# Patient Record
Sex: Male | Born: 1935 | Race: White | Hispanic: No | Smoking: Former smoker
Health system: Southern US, Community
[De-identification: ages and names within clinical notes are randomized; demographics above are authoritative.]

## PROBLEM LIST (undated history)

## (undated) DIAGNOSIS — H269 Unspecified cataract: Secondary | ICD-10-CM

## (undated) DIAGNOSIS — I10 Essential (primary) hypertension: Secondary | ICD-10-CM

## (undated) DIAGNOSIS — R0902 Hypoxemia: Secondary | ICD-10-CM

## (undated) DIAGNOSIS — K5900 Constipation, unspecified: Secondary | ICD-10-CM

## (undated) DIAGNOSIS — R011 Cardiac murmur, unspecified: Secondary | ICD-10-CM

## (undated) DIAGNOSIS — F329 Major depressive disorder, single episode, unspecified: Secondary | ICD-10-CM

## (undated) DIAGNOSIS — I350 Nonrheumatic aortic (valve) stenosis: Secondary | ICD-10-CM

## (undated) DIAGNOSIS — S72033A Displaced midcervical fracture of unspecified femur, initial encounter for closed fracture: Secondary | ICD-10-CM

## (undated) DIAGNOSIS — F32A Depression, unspecified: Secondary | ICD-10-CM

## (undated) DIAGNOSIS — K469 Unspecified abdominal hernia without obstruction or gangrene: Secondary | ICD-10-CM

## (undated) DIAGNOSIS — K921 Melena: Secondary | ICD-10-CM

## (undated) DIAGNOSIS — N200 Calculus of kidney: Secondary | ICD-10-CM

## (undated) DIAGNOSIS — I739 Peripheral vascular disease, unspecified: Secondary | ICD-10-CM

## (undated) DIAGNOSIS — G609 Hereditary and idiopathic neuropathy, unspecified: Secondary | ICD-10-CM

## (undated) DIAGNOSIS — D649 Anemia, unspecified: Secondary | ICD-10-CM

## (undated) DIAGNOSIS — R609 Edema, unspecified: Secondary | ICD-10-CM

## (undated) DIAGNOSIS — M79609 Pain in unspecified limb: Secondary | ICD-10-CM

## (undated) DIAGNOSIS — K210 Gastro-esophageal reflux disease with esophagitis: Secondary | ICD-10-CM

## (undated) DIAGNOSIS — E785 Hyperlipidemia, unspecified: Secondary | ICD-10-CM

## (undated) DIAGNOSIS — R269 Unspecified abnormalities of gait and mobility: Secondary | ICD-10-CM

## (undated) DIAGNOSIS — C4441 Basal cell carcinoma of skin of scalp and neck: Secondary | ICD-10-CM

## (undated) DIAGNOSIS — N401 Enlarged prostate with lower urinary tract symptoms: Principal | ICD-10-CM

## (undated) DIAGNOSIS — D494 Neoplasm of unspecified behavior of bladder: Secondary | ICD-10-CM

## (undated) DIAGNOSIS — C61 Malignant neoplasm of prostate: Secondary | ICD-10-CM

## (undated) DIAGNOSIS — I060 Rheumatic aortic stenosis: Secondary | ICD-10-CM

## (undated) DIAGNOSIS — G4700 Insomnia, unspecified: Secondary | ICD-10-CM

## (undated) DIAGNOSIS — H02109 Unspecified ectropion of unspecified eye, unspecified eyelid: Secondary | ICD-10-CM

## (undated) HISTORY — DX: Peripheral vascular disease, unspecified: I73.9

## (undated) HISTORY — DX: Hypoxemia: R09.02

## (undated) HISTORY — DX: Constipation, unspecified: K59.00

## (undated) HISTORY — DX: Unspecified cataract: H26.9

## (undated) HISTORY — DX: Depression, unspecified: F32.A

## (undated) HISTORY — DX: Basal cell carcinoma of skin of scalp and neck: C44.41

## (undated) HISTORY — DX: Displaced midcervical fracture of unspecified femur, initial encounter for closed fracture: S72.033A

## (undated) HISTORY — DX: Nonrheumatic aortic (valve) stenosis: I35.0

## (undated) HISTORY — DX: Unspecified abnormalities of gait and mobility: R26.9

## (undated) HISTORY — DX: Major depressive disorder, single episode, unspecified: F32.9

## (undated) HISTORY — DX: Neoplasm of unspecified behavior of bladder: D49.4

## (undated) HISTORY — DX: Insomnia, unspecified: G47.00

## (undated) HISTORY — DX: Edema, unspecified: R60.9

## (undated) HISTORY — DX: Unspecified ectropion of unspecified eye, unspecified eyelid: H02.109

## (undated) HISTORY — DX: Gastro-esophageal reflux disease with esophagitis: K21.0

## (undated) HISTORY — DX: Melena: K92.1

## (undated) HISTORY — PX: CHOLECYSTECTOMY: SHX55

## (undated) HISTORY — DX: Cardiac murmur, unspecified: R01.1

## (undated) HISTORY — DX: Anemia, unspecified: D64.9

## (undated) HISTORY — PX: EYE SURGERY: SHX253

## (undated) HISTORY — DX: Pain in unspecified limb: M79.609

## (undated) HISTORY — DX: Hereditary and idiopathic neuropathy, unspecified: G60.9

## (undated) HISTORY — DX: Rheumatic aortic stenosis: I06.0

## (undated) HISTORY — DX: Hyperlipidemia, unspecified: E78.5

## (undated) HISTORY — DX: Benign prostatic hyperplasia with lower urinary tract symptoms: N40.1

---

## 2000-01-18 ENCOUNTER — Ambulatory Visit (HOSPITAL_COMMUNITY): Admission: RE | Admit: 2000-01-18 | Discharge: 2000-01-18 | Payer: Self-pay | Admitting: Gastroenterology

## 2001-01-31 ENCOUNTER — Other Ambulatory Visit: Admission: RE | Admit: 2001-01-31 | Discharge: 2001-01-31 | Payer: Self-pay | Admitting: Urology

## 2001-01-31 ENCOUNTER — Encounter (INDEPENDENT_AMBULATORY_CARE_PROVIDER_SITE_OTHER): Payer: Self-pay | Admitting: Specialist

## 2001-02-12 ENCOUNTER — Encounter: Payer: Self-pay | Admitting: Urology

## 2001-02-12 ENCOUNTER — Encounter: Admission: RE | Admit: 2001-02-12 | Discharge: 2001-02-12 | Payer: Self-pay | Admitting: Urology

## 2001-02-20 ENCOUNTER — Ambulatory Visit: Admission: RE | Admit: 2001-02-20 | Discharge: 2001-05-21 | Payer: Self-pay | Admitting: Radiation Oncology

## 2001-04-04 ENCOUNTER — Encounter: Payer: Self-pay | Admitting: Urology

## 2001-05-02 ENCOUNTER — Ambulatory Visit (HOSPITAL_COMMUNITY): Admission: RE | Admit: 2001-05-02 | Discharge: 2001-05-02 | Payer: Self-pay | Admitting: Urology

## 2001-05-02 ENCOUNTER — Encounter: Payer: Self-pay | Admitting: Urology

## 2001-05-23 ENCOUNTER — Ambulatory Visit: Admission: RE | Admit: 2001-05-23 | Discharge: 2001-08-21 | Payer: Self-pay | Admitting: Radiation Oncology

## 2002-01-28 ENCOUNTER — Ambulatory Visit (HOSPITAL_COMMUNITY): Admission: RE | Admit: 2002-01-28 | Discharge: 2002-01-28 | Payer: Self-pay | Admitting: Gastroenterology

## 2004-01-29 ENCOUNTER — Ambulatory Visit (HOSPITAL_COMMUNITY): Admission: AD | Admit: 2004-01-29 | Discharge: 2004-01-31 | Payer: Self-pay | Admitting: Ophthalmology

## 2004-02-26 ENCOUNTER — Emergency Department (HOSPITAL_COMMUNITY): Admission: EM | Admit: 2004-02-26 | Discharge: 2004-02-26 | Payer: Self-pay | Admitting: Emergency Medicine

## 2004-02-28 ENCOUNTER — Emergency Department (HOSPITAL_COMMUNITY): Admission: EM | Admit: 2004-02-28 | Discharge: 2004-02-29 | Payer: Self-pay | Admitting: Emergency Medicine

## 2004-03-07 ENCOUNTER — Encounter: Admission: RE | Admit: 2004-03-07 | Discharge: 2004-03-07 | Payer: Self-pay | Admitting: Gastroenterology

## 2004-03-08 ENCOUNTER — Ambulatory Visit (HOSPITAL_COMMUNITY): Admission: RE | Admit: 2004-03-08 | Discharge: 2004-03-08 | Payer: Self-pay | Admitting: Gastroenterology

## 2004-03-15 ENCOUNTER — Encounter (INDEPENDENT_AMBULATORY_CARE_PROVIDER_SITE_OTHER): Payer: Self-pay | Admitting: *Deleted

## 2004-03-16 ENCOUNTER — Inpatient Hospital Stay (HOSPITAL_COMMUNITY): Admission: RE | Admit: 2004-03-16 | Discharge: 2004-03-18 | Payer: Self-pay | Admitting: General Surgery

## 2006-07-09 HISTORY — PX: HERNIA REPAIR: SHX51

## 2007-07-23 ENCOUNTER — Inpatient Hospital Stay (HOSPITAL_COMMUNITY): Admission: RE | Admit: 2007-07-23 | Discharge: 2007-07-24 | Payer: Self-pay | Admitting: Neurosurgery

## 2008-07-09 HISTORY — PX: SPINE SURGERY: SHX786

## 2008-09-13 ENCOUNTER — Encounter: Admission: RE | Admit: 2008-09-13 | Discharge: 2008-09-13 | Payer: Self-pay | Admitting: Sports Medicine

## 2008-09-27 ENCOUNTER — Encounter: Admission: RE | Admit: 2008-09-27 | Discharge: 2008-09-27 | Payer: Self-pay | Admitting: Sports Medicine

## 2008-10-11 ENCOUNTER — Encounter: Admission: RE | Admit: 2008-10-11 | Discharge: 2008-10-11 | Payer: Self-pay | Admitting: Sports Medicine

## 2008-11-15 ENCOUNTER — Inpatient Hospital Stay (HOSPITAL_COMMUNITY): Admission: RE | Admit: 2008-11-15 | Discharge: 2008-11-16 | Payer: Self-pay | Admitting: Neurosurgery

## 2008-12-09 ENCOUNTER — Encounter: Admission: RE | Admit: 2008-12-09 | Discharge: 2008-12-09 | Payer: Self-pay | Admitting: Neurosurgery

## 2009-12-09 ENCOUNTER — Ambulatory Visit: Payer: Self-pay | Admitting: Vascular Surgery

## 2010-02-10 ENCOUNTER — Ambulatory Visit: Payer: Self-pay | Admitting: Vascular Surgery

## 2010-07-09 DIAGNOSIS — H269 Unspecified cataract: Secondary | ICD-10-CM

## 2010-07-09 HISTORY — DX: Unspecified cataract: H26.9

## 2010-09-02 ENCOUNTER — Emergency Department (HOSPITAL_COMMUNITY)
Admission: EM | Admit: 2010-09-02 | Discharge: 2010-09-02 | Disposition: A | Discharge status: Home / Self Care | Payer: Medicare Other | Attending: Emergency Medicine | Admitting: Emergency Medicine

## 2010-09-02 DIAGNOSIS — T8140XA Infection following a procedure, unspecified, initial encounter: Secondary | ICD-10-CM | POA: Insufficient documentation

## 2010-09-02 DIAGNOSIS — I10 Essential (primary) hypertension: Secondary | ICD-10-CM | POA: Insufficient documentation

## 2010-09-02 DIAGNOSIS — Y838 Other surgical procedures as the cause of abnormal reaction of the patient, or of later complication, without mention of misadventure at the time of the procedure: Secondary | ICD-10-CM | POA: Insufficient documentation

## 2010-10-17 LAB — CBC
HCT: 39.5 % (ref 39.0–52.0)
Hemoglobin: 13.6 g/dL (ref 13.0–17.0)
MCHC: 34.5 g/dL (ref 30.0–36.0)
MCV: 98.6 fL (ref 78.0–100.0)
Platelets: 296 10*3/uL (ref 150–400)
RBC: 4 MIL/uL — ABNORMAL LOW (ref 4.22–5.81)
RDW: 14.5 % (ref 11.5–15.5)
WBC: 7.9 10*3/uL (ref 4.0–10.5)

## 2010-10-17 LAB — BASIC METABOLIC PANEL
BUN: 16 mg/dL (ref 6–23)
CO2: 29 mEq/L (ref 19–32)
Calcium: 9.7 mg/dL (ref 8.4–10.5)
Chloride: 104 mEq/L (ref 96–112)
Creatinine, Ser: 1.01 mg/dL (ref 0.4–1.5)
GFR calc Af Amer: >60 mL/min (ref >60)
GFR calc non Af Amer: >60 mL/min (ref >60)
Glucose, Bld: 100 mg/dL — ABNORMAL HIGH (ref 70–99)
Potassium: 4.2 mEq/L (ref 3.5–5.1)
Sodium: 141 mEq/L (ref 135–145)

## 2010-11-21 NOTE — Op Note (Signed)
NAMESAMUAL, BEALS              ACCOUNT NO.:  [id number]   MEDICAL RECORD NO.:  [id number]          PATIENT TYPE:  INP   LOCATION:  3535                         FACILITY:  [facility]   PHYSICIAN:  Hewitt Shorts, M.D.DATE OF BIRTH:  Jul 15, 1925   DATE OF PROCEDURE:  11/15/2008  DATE OF DISCHARGE:                               OPERATIVE REPORT   PREOPERATIVE DIAGNOSES:  Right L4-5 lumbar disk herniation, lumbar  degenerative disk disease, lumbar spondylosis, and lumbar radiculopathy.   POSTOPERATIVE DIAGNOSES:  Right L4-5 lumbar disk herniation, lumbar  degenerative disk disease, lumbar spondylosis, lumbar radiculopathy, and  right L4-5 synovial cyst.   PROCEDURES:  Right L4-5 lumbar laminotomy, resection of synovial cyst  with microdissection and microdiskectomy with microdissection.   SURGEON:  Hewitt Shorts, M.D.   ASSISTANT:  1. Nelia Shi. Webb Silversmith, RN  2. Payton Doughty, M.D.   ANESTHESIA:  General endotracheal.   INDICATIONS:  The patient is an 77 year old man who presented with a  right lumbar radiculopathy.  He was found to have a large right L4-5  lumbar disk herniation, assume underlying spondylosis with degenerative  disk disease.  Decision was made to proceed with elective laminotomy and  microdiskectomy.   PROCEDURES IN DETAIL:  The patient was brought to the operating room and  placed under general endotracheal anesthesia.  The patient was turned to  a prone position.  Lumbar region was prepped with Betadine soap and  solution and draped in a sterile fashion.  The midline was infiltrated  with local anesthetic with epinephrine.  An x-ray was taken, and the L4-  5 level identified.  A midline incision was made at the L4-5 level and  carried down through the subcutaneous tissue.  Bipolar cautery and  electrocautery were used to maintain hemostasis.  Dissection was carried  down through the lumbar fascia, which was incised on the right side of  the midline, and  the paraspinal muscles were dissected from the spinous  process and lamina in a subperiosteal fashion.  The L4-5 intralaminar  space was identified.  An x-ray was taken to confirm the localization.  Then, the microscope was draped and brought to the field to provide  additional navigation, illumination, and visualization, and the  remainder of the decompression was performed using microdissection and  microsurgical technique.   Laminotomy was performed using the X-Max drill and Kerrison punches.  As  this was performed, we began to find a large synovial cyst in the  dorsolateral epidural space.  It was partially calcified. We extended  the laminotomy rostrally to have better exposure around this.  It was  stuck down to the lateral aspect of the thecal sac in the lateral aspect  of the right L5 nerve root.  We mobilized it as feasible and resected  the synovial cyst taking care to leave the dura undisturbed, and the  small bits of the cyst, which were partially calcified and densely  adherent to the dura, were left attached to the dura, but the mass of  the synovial cyst was successfully removed.  We then continued our  exploration of  the epidural space ventrally.  The disk space was  identified.  There was spondylitic overgrowth from the posterior aspect  with disk space, and we encountered several fragments of very  degenerated disk material that had hernia through a large rent in the  anulus.  We continued to open the anulus.  We removed spondylitic  overgrowth in the posterior aspects of the L4 and L5 and vertebral  bodies.  We entered into the disk space and proceeded with a thorough  diskectomy using a variety of microcurettes and pituitary rongeurs, and  we thoroughly examined the epidural space to remove the additional  fragments and achieved good decompression of the thecal sac and nerve  roots.  Hemostasis was established using bipolar cautery and Gelfoam  soaked in thrombin.   In the end, the mass of the synovial cyst was  successfully resected.  The herniated disk including the fragments free  within the epidural space as well as the degenerative disk material  within the disk space was successfully removed.  Small bits of the edges  of the synovial cyst which were densely adherent to the dura were left  in place,so as not to disrupt the dura, and it was felt that good  decompression was achieved, and we therefore went ahead and irrigated  the wound thoroughly with bacitracin solution.  Good hemostasis was  confirmed.  Good decompression was confirmed, and then we proceeded with  closure immediately.  Prior to closure, we instilled 2 mL of fentanyl  and 80 mg of Depo-Medrol into the epidural space, and then the deep  fascia was closed with interrupted undyed #1 Vicryl sutures.  Scarpa  fascia was closed with interrupted undyed 1 Vicryl sutures.  The  subcutaneous and subcuticular were closed with interrupted inverted 2-0  undyed Vicryl sutures, and the skin was closed with Dermabond.  The  procedure was tolerated well.  The estimated blood loss was 50 mL.  Sponge and needle count were correct.  Following surgery, the patient  was returned back to the supine position to reverse from the anesthetic,  extubated, and transferred to the recovery room for further care.      Hewitt Shorts, M.D.  Electronically Signed     RWN/MEDQ  D:  11/15/2008  T:  11/16/2008  Job:  [id number]

## 2010-11-21 NOTE — Consult Note (Signed)
NEW PATIENT CONSULTATION   Justin Deleon, Justin Deleon  DOB:  1925-09-02                                       12/09/2009  WUJWJ#:[id number]   The patient presents today for evaluation of lower extremity discomfort.  He is a very pleasant, active 77 year old gentleman with bilateral lower  extremity discomfort.  He has several different components of this.  He  reports some of this to being flat-footed since his mid teens.  He  reports that if he does a great deal walking that he does have pretibial  aching.  It extends down into his feet.  This is relieved with rest.  He  has no tissue loss.  He does not have any resting symptoms.  He does not  have any extension of this discomfort higher up onto his thighs, and  this does occur with moderate walking.   PAST MEDICAL HISTORY:  Significant for hypertension.  He does not have  diabetes or cardiac disease.  He does have prior history for  cholecystectomy and surgery for detached retinas and also has had lumbar  disk surgery x2.  He also has history of surgery for prostate cancer.   FAMILY HISTORY:  Negative for premature atherosclerotic disease.   SOCIAL HISTORY:  He is married with 3 children.  He is a retired  Insurance underwriter.  He does not smoke.  He did quit smoking cigars in  1996.  He has 1 alcohol drink per day.   REVIEW OF SYSTEMS:  No weight loss or weight gain.  His weight is 182  pounds.  He is 5 feet 2 inches tall.  CARDIAC:  Positive for heart murmur.  PULMONARY:  Negative.  GI:  Positive for reflux.  GU:  Negative aside from prostate cancer.  VASCULAR:  Pain in legs with walking.  NEUROLOGIC:  Negative for blackouts, dizziness, seizure.  MUSCULOSKELETAL:  Negative from above.  PSYCHIATRIC:  Positive for depression.  HEENT:  Positive for change in his eyesight and hearing.  HEMATOLOGIC:  No positive review of systems.   PHYSICAL EXAMINATION:  Well-developed, well-nourished white male,  appearing stated  age of 59.  Blood pressure 134/72, pulse 69,  respirations 18.  His oxygen saturation is 98%.  He is in no acute  distress.  HEENT:  Normal.  Neck:  No bruits bilaterally.  Chest:  Clear  bilaterally.  Heart:  Regular rate and rhythm with murmur present.  Abdomen:  Soft, nontender.  No masses noted.  Musculoskeletal:  Shows no  major deformities or cyanosis.  Neurologic:  No focal weakness,  paresthesias.  Skin:  Without ulcers or rashes.  He does have palpable  femoral and palpable popliteal pulses and palpable radial pulses  bilaterally.  I do not palpate pedal pulses bilaterally.   He underwent noninvasive vascular laboratory studies in our office, and  I reviewed these with him.  This does show falsely elevated pressures  due to calcification.  His waveforms are biphasic, showing some disease.  Due to his palpable popliteal pulses, I feel that this is related to  tibial disease.  I discussed this at length with the patient.  I feel  that he has no limb-threatening level of ischemia.  I do feel that at  least part of this component of his pain onto his pretibial area is  related to typical lower extremity  claudication from tibial disease.  I  explained that this is not limb-threatening at this level.  I did  explain the potential benefit of Pletal for relief of his intermittent  claudication.  I would recommend that we try this for 2 months to  determine if he is having any symptomatic relief and if so would  continue this, otherwise would discontinue this.  He was written a  prescription for Pletal 100 mg b.i.Deleon., and we will see him back in 2  months for further discussion.     Larina Earthly, M.Deleon.  Electronically Signed   TFE/MEDQ  Deleon:  12/09/2009  T:  12/12/2009  Job:  [id number]   cc:   Toni Arthurs, MD  Deirdre Peer. Polite, M.Deleon.

## 2010-11-21 NOTE — Assessment & Plan Note (Signed)
OFFICE VISIT   KEMONTE, ULLMAN  DOB:  Mar 13, 1926                                       02/10/2010  NWGNF#:[id number]   The patient presents today for continued discussion regarding his  moderate bilateral extremity arterial insufficiency.  I had seen him for  initial consultation on 12/09/2009.  At that time he was found to have  tibial disease with some discomfort in both lower extremities which  seemed to be of multiple etiologies.  I felt that he was at no limb  threatening risk for ischemia and had started him on Pletal to determine  if a trial of this would improve his walking.  He had GI upset related  to this and was unable to tolerate it.  He is stable, however, with no  tissue loss.  He does have palpable popliteal pulses and absent distal  pulses.  His feet are well-perfused with no lesions.  He understands to  continue watching for good foot care and will see [place] again on an as-  needed basis.     Larina Earthly, M.D.  Electronically Signed   TFE/MEDQ  D:  02/10/2010  T:  02/10/2010  Job:  [id number]   cc:   Dr Nehemiah Settle  Toni Arthurs, MD

## 2010-11-21 NOTE — Op Note (Signed)
Justin Deleon, Justin Deleon NO.:  [id number]   MEDICAL RECORD NO.:  [id number]          PATIENT TYPE:  INP   LOCATION:  3536                         FACILITY:  [facility]   PHYSICIAN:  Hewitt Shorts, M.D.DATE OF BIRTH:  05/22/1926   DATE OF PROCEDURE:  07/23/2007  DATE OF DISCHARGE:                               OPERATIVE REPORT   PREOPERATIVE DIAGNOSES:  1. Right L3-4 lumbar disk herniation.  2. Lumbar spondylosis.  3. Lumbar degenerative disk disease.  4. Lumbar radiculopathy.   POSTOPERATIVE DIAGNOSES:  1. Right L3-4 lumbar disk herniation.  2. Lumbar spondylosis.  3. Lumbar degenerative disk disease.  4. Lumbar radiculopathy.   PROCEDURE:  Right L3-4 lumbar laminotomy and microdiskectomy with  microdissection.   SURGEON:  Hewitt Shorts, M.D.   ASSISTANT:  Nelia Shi. Webb Silversmith, NP and Danae Orleans. Venetia Maxon, M.D.   ANESTHESIA:  General endotracheal.   INDICATIONS:  The patient is a 77 year old man who presented with a  right lumbar radiculopathy.  MRI revealed a large right L3-4 lumbar disk  herniation.  A decision was made to proceed with elective laminotomy and  microdiskectomy.   DESCRIPTION OF PROCEDURE:  The patient was brought to the operating room  and placed under general endotracheal anesthesia.  The patient was  turned to the prone position.  Lumbar region was prepped with Betadine  soap and solution and draped in a sterile fashion.  The midline was  infiltrated with local anesthetic with epinephrine.  An x-ray was taken.  The L3-4 level was identified and then a midline incision was made over  the L3-4 level and carried down through the subcutaneous tissue.  Bipolar cautery and electrocautery was used to maintain hemostasis.  Dissection was carried down to the lumbar fascia, which was incised on  the right side of the midline and the paraspinal muscles were dissected  from the spinous process and lamina in a subperiosteal fashion.  The L3-  4  interlaminar space was identified.  An x-ray was taken and  localization was confirmed, and then the microscope was draped and  brought into the field to provide additional navigation, illumination,  and visualization.  The remainder of the decompression was performed  using microdissection and microsurgical technique.  Laminotomy was  performed using an X-Max drill and Kerrison punch.  The ligamentum  flavum was carefully removed, and we identified the thecal sac exiting  the right L4 nerve root.  We gently retracted the thecal sac and a large  disk herniation was identified.  We incised the remaining anular fibers  and removed significant spondylitic subligamentous disk herniation.  We  continued to enter into the disk space and proceeded with diskectomy  using a variety of pituitary rongeurs.  Then, we examined the epidural  space, lateral recess, and neural foramen to ensure that all the disk  herniation had been successfully removed and a good decompression of the  thecal sac and nerve root had been achieved.  Once the diskectomy was  completed, hemostasis was established with the use of bipolar cautery  and Gelfoam soaked in thrombin.  All the Gelfoam  now was removed prior  to closure.  The wound was irrigated numerous times during the procedure  with Bacitracin solution.  Once hemostasis was confirmed, then we  inserted 2 mL of fentanyl, 80 mg of Depo-Medrol, and then proceeded to  close the deep fascia with interrupted undyed 1 Vicryl sutures.  The  Scarpa's fascia was closed with interrupted undyed 1 Vicryl sutures.  The subcutaneous and subcuticular closed with interrupted inverted 2-0  undyed Vicryl sutures.  The skin was reapproximated with Dermabond.  The  procedure was tolerated well.  The estimated blood loss was 50 mL.  Sponge and needle counts were correct.  Following surgery, the patient  was turned back to supine position to be reversed from the anesthetic,  extubated,  and transferred to the recovery room for further care.      Hewitt Shorts, M.D.  Electronically Signed     RWN/MEDQ  D:  07/23/2007  T:  07/24/2007  Job:  [id number]

## 2010-11-24 NOTE — Procedures (Signed)
[facility]  Patient:    Justin Deleon, Justin Deleon Visit Number: [id number] MRN: [id number]          Service Type: END Location: ENDO Attending Physician:  Dennison Bulla Ii Dictated by:   Verlin Grills, M.D. Proc. Date: 01/28/02 Admit Date:  01/28/2002 Discharge Date: 01/28/2002   CC:         Molly Maduro D. Vaughan Basta., M.D.   Procedure Report  PROCEDURE:  Diagnostic colonoscopy.  REFERRING PHYSICIAN:  Barbette Hair. Vaughan Basta., M.D.  PROCEDURE INDICATION:  Mr. Justin Deleon is a 77 year old male, born [birth year].  Mr. Justin Deleon is undergoing diagnostic colonoscopy to evaluate guaiac positive stool.  In 1998, Mr. Justin Deleon underwent a colonoscopy with removal of an adenomatous polyp.  In 2000, his colonoscopy was normal.  ENDOSCOPIST:  Verlin Grills, M.D.  PREMEDICATION:  Versed 5 mg, Demerol 30 mg  ENDOSCOPE:  Olympus pediatric colonoscope.  DESCRIPTION OF PROCEDURE:  After obtaining informed consent, Justin Deleon was placed in the left lateral decubitus position.  I administered intravenous Demerol and intravenous Versed to achieve conscious sedation for the procedure.  The patients blood pressure, oxygen saturation, and cardiac rhythm were monitored throughout the procedure and documented in the medical record.  Anal inspection was normal.  Digital rectal exam was normal.  The prostate was nonnodular.  The Olympus pediatric video colonoscope was introduced into the rectum and easily advanced to the cecum.  Colonic preparation for the exam today was excellent.  RECTUM:  Normal.  SIGMOID COLON AND DESCENDING COLON:  Normal.  SPLENIC FLEXURE:  Normal.  TRANSVERSE COLON:  Normal.  HEPATIC FLEXURE:  Normal.  ASCENDING COLON:  Normal.  CECUM AND ILEOCECAL VALVE:  Normal.  ASSESSMENT:  Normal proctocolonoscopy to the cecum.  No endoscopic evidence for the presence of colorectal neoplasia.  RECOMMENDATIONS:  Repeat  colonoscopy in five years based on his history of neoplastic colon polyps removed in 1998 colonoscopically. Dictated by:   Verlin Grills, M.D. Attending Physician:  Dennison Bulla Ii DD:  01/28/02 TD:  01/31/02 Job: [id number]

## 2010-11-24 NOTE — Op Note (Signed)
Justin Deleon, Justin Deleon NO.:  [id number]   MEDICAL RECORD NO.:  [id number]                   PATIENT TYPE:  OBV   LOCATION:  0484                                 FACILITY:  [facility]   PHYSICIAN:  Angelia Mould. Derrell Lolling, M.D.             DATE OF BIRTH:  1926/04/05   DATE OF PROCEDURE:  03/15/2004  DATE OF DISCHARGE:                                 OPERATIVE REPORT   PREOPERATIVE DIAGNOSES:  1.  Chronic cholecystitis with cholelithiasis.  2.  Umbilical hernia.  3.  Incarcerated ventral hernia.   POSTOPERATIVE DIAGNOSES:  1.  Chronic cholecystitis with cholelithiasis.  2.  Umbilical hernia.  3.  Incarcerated ventral hernia.   OPERATION PERFORMED:  1.  Laparoscopic cholecystectomy with intraoperative cholangiogram.  2.  Repair of umbilical hernia and incarcerated ventral hernia (primary      repair).   SURGEON:  Angelia Mould. Derrell Lolling, M.D.   FIRST ASSISTANT:  Currie Paris, M.D.   OPERATIVE INDICATIONS:  This is a 77 year old white male who presents with a  recent onset of repeated attacks daily, postprandial, epigastric,  substernal, and back pain.  This tends to occur after eating, and he  controls pain by not eating.  His symptoms persisted despite double-dose  Nexium and Zelnorm.  A gallbladder ultrasound showed floating cholesterol  crystals and sludge.  He has had an upper endoscopy which is totally normal.   On exam, he had a small, reducible umbilical hernia and a large 5 cm soft  tissue mass above the umbilicus, which was not reducible.  He is brought to  the operating room electively.   OPERATIVE FINDINGS:  The gallbladder appeared chronically inflamed.  Moderate adhesions to it but it was thin-walled.  The bowel was very dark  and thick.  The anatomy of the cystic duct, cystic artery, and common bile  duct was conventional.  The cholangiogram was normal, showing normal  intrahepatic and extrahepatic bile ducts, no filling defects, and  prompt  flow of contrast into the duodenum.  The liver looked normal.  There was  omentum incarcerated to about a 4-5 cm herniated defect above the umbilicus  in the midline.  The small bowel, large bowel, and peritoneal surfaces  otherwise looked normal.   OPERATIVE TECHNIQUE:  Following the induction of general endotracheal  anesthesia, the patient's abdomen is prepped and draped in a sterile  fashion.  We were able to reduce the umbilical hernia.  We made a vertical  incision below the umbilicus.  We identified the herniated defect.  We  bluntly dissected into the abdominal cavity.  A 10 mm Hasson trocar was  inserted under direct vision and held in place with 0 Vicryl stay sutures.  The pneumoperitoneum was created.  The video camera was inserted with good  visualization and findings, as described above.  A 10 mm trocar was placed  in the subxiphoid region, and two 5 mm trocars  were placed in the right mid  abdomen.  We elevated the gallbladder.  Adhesions were taken down.  We  dissected the infundibulum of the gallbladder away from the adhesions.  We  dissected out the cystic duct and the cystic artery.  The cystic artery was  controlled as it went under the gallbladder wall.  It was secured with  multiple metal clips and divided.  We then had a very large window behind  the very tiny cystic duct.  A cholangiogram catheter was inserted into the  cystic duct.  The cholangiogram was obtained using the C-arm.  This showed  normal intrahepatic and extrahepatic biliary tree, no filling defects, and  quick, prompt flow of contrast into the duodenum.  The cholangiogram  catheter was removed.  The cystic duct was secured with multiple metal clips  and divided.  The gallbladder was dissected from its bed with electrocautery  and removed.  There was spillage of some bile.  We irrigated the abdomen out  with about 2500 cc of saline.  At the completion of the case, the irrigation  fluid was  completely clear.  There was no bile leak whatsoever, and there  was no bleeding whatsoever.  The trocars were removed under direct vision.  There was no bleeding from the trocar sites.  The pneumoperitoneum was  released.   I then extended the infraumbilical incision up around the left of the  umbilicus and above for about 8 cm.  We debrided a very large hernia sac  away from the incarcerated omentum.  After incising the fascia inferiorly in  the midline, we were able to reduce the omentum back into the abdominal  cavity.  We checked for bleeding.  There was none.  We debrided the hernia  sac all the way back to the edges of the fascia.  We closed the midline  defect with interrupted sutures of #1 Novofil.  The wound was irrigated with  saline, and the skin incisions were closed with skin staples.  Clean  bandages were placed.  The patient was taken to the recovery room in stable  condition.  Estimated blood loss was about 30-40 cc.  Complications wee  none.  Sponge, needle, and instrument counts were correct.                                               Angelia Mould. Derrell Lolling, M.D.    HMI/MEDQ  D:  03/15/2004  T:  03/15/2004  Job:  [id number]   cc:   Ike Bene, M.D.  [address]  [place]  [place] [address]  Fax: [phone]   Danise Edge, M.D.  [address]  [place]  [place] [address]  Fax: [phone]

## 2010-11-24 NOTE — Op Note (Signed)
[facility]  Patient:    Justin Deleon, Justin Deleon Visit Number: [id number] MRN: [id number]          Service Type: DSU Location: DAY Attending Physician:  Lurene Shadow. Date: 05/02/01 Admit Date:  05/02/2001 Discharge Date: 05/02/2001                             Operative Report  PREOPERATIVE DIAGNOSIS:  Adenocarcinoma of the prostate.  POSTOPERATIVE DIAGNOSIS:  Adenocarcinoma of the prostate.  OPERATION:  Implantation of I-125 seeds (96 seeds in 27 needles with dose of 0.326 mCi per seed).  PREPARATION:  After appropriate preanesthesia, the patient is brought to the operating room and placed upon the operating table in the dorsal supine position where general LMA anesthesia was introduced.  He was then re-placed in the dorsal lithotomy position where the pubis was prepped with Betadine solution and draped in the usual fashion.  DESCRIPTION OF PROCEDURE:  The patient was ultrasounded, and with fluoroscopic control, the patient underwent implantation of 27 needles, with a total of 96 seeds of I-125 for his prostate cancer.  It is noted that his PSA is 5.4, with Gleason 4+3 adenocarcinoma predominantly in the left side.  The patient tolerated the procedure well.  Cystoscopy revealed no evidence of seeds in the bladder.  There were seeds identified on both sides in the inner portion of the prostate, outside the bladder.  X-rays showed the seeds to be in good position.  A Foley catheter was placed, and the patient was awakened and given IV Toradol and taken to the recovery room in good condition. Attending Physician:  Laqueta Jean DD:  05/02/01 TD:  05/04/01 Job: [id number]

## 2010-11-24 NOTE — Discharge Summary (Signed)
NAME:  Justin Deleon, Justin Deleon NO.:  [id number]   MEDICAL RECORD NO.:  [id number]                   PATIENT TYPE:  OIB   LOCATION:  5736                                 FACILITY:  [facility]   PHYSICIAN:  Guadelupe Sabin, M.D.             DATE OF BIRTH:  June 06, 1926   DATE OF ADMISSION:  01/29/2004  DATE OF DISCHARGE:  01/30/2004                                 DISCHARGE SUMMARY   HISTORY:  This was an urgent outpatient admission of this 77 year old white  male who had had previous cataract implant surgery several years ago, and  developed a recent sudden loss of vision in his right eye due to bullous  rhegmatogenous retinal detachment (see detailed history and physical).   HOSPITAL COURSE:  The patient was evaluated preoperatively and felt to be in  satisfactory condition for the proposed retinal detachment surgery.  The  patient was therefore taken into the operating room where a complex scleral  buckling procedure was performed.  The procedure was difficult due to the  presence of extremely thin sclerae with scleral dehiscences in the area of  the retinal tear at the 10:30 position.  External silicone implants were  utilized with drainage of subretinal fluid, transscleral type, and injection  of  0.4 cc of SF6.  The patient tolerated the procedure well, and was taken  to the recovery room and placed on his left side.  Subsequently, he was  transferred to the 23-hour observation unit.  The patient was monitored on  the evening of surgery, and the following morning, and felt to be doing  well.  The retinal tear was well positioned on the scleral implant surface.  Inferior choroidal detachment was present.  The retina itself had settled in  place.  It was felt that the patient had achieved maximal hospital benefit,  and he could be discharged on limited activity, to be followed in the  office.  The patient was given a printed list of discharge instructions on  the  care and use of the operated eye.  Discharge ocular medications included  TobraDex and Cyclomydril ophthalmic solutions, 1 drop 4 times a day, 5  minutes apart, and Maxitrol and atropine ointment at bedtime.   FOLLOWUP:  A followup appointment in my office 24-48 hours.   DISCHARGE CONDITION:  Improved.   DISCHARGE DIAGNOSES:  1. Rhegmatogenous retinal detachment right.  2. Pseudophakia both eyes.                                                Guadelupe Sabin, M.D.    HNJ/MEDQ  D:  02/12/2004  T:  02/13/2004  Job:  [id number]   cc:   Richarda Overlie, M.D.  [address] [place]  [place] [address]  Fax:  [phone]   Ike Bene, M.D.  [address]  [place]  [place] [address]  Fax: [phone]

## 2010-11-24 NOTE — Op Note (Signed)
NAME:  Justin Deleon, Justin Deleon NO.:  [id number]   MEDICAL RECORD NO.:  [id number]                   PATIENT TYPE:  OIB   LOCATION:  5736                                 FACILITY:  [facility]   PHYSICIAN:  Guadelupe Sabin, M.D.             DATE OF BIRTH:  03-16-1926   DATE OF PROCEDURE:  01/29/2004  DATE OF DISCHARGE:  01/31/2004                                 OPERATIVE REPORT   PREOPERATIVE DIAGNOSIS:  Rhegmatogenous retinal detachment, right eye.   POSTOPERATIVE DIAGNOSIS:  Rhegmatogenous retinal detachment, right eye.   OPERATION PERFORMED:  Planned scleral buckling procedure right eye using  solid silicone implants, cryoapplication, external drainage of subretinal  fluid.   SURGEON:  Guadelupe Sabin, M.D.   ASSISTANT:  Nurse.   ANESTHESIA:  General.   OPHTHALMOSCOPY:  As previously described.   DESCRIPTION OF PROCEDURE:  After the patient was prepped and draped, lid  traction sutures were placed in the right upper and lower lids.  A lid  speculum was inserted.  A peritomy was performed 360 degrees adjacent to the  cornea.  The subconjunctival tissue was cleaned and rectus traction sutures  were placed in all four rectus muscles.  The sclera was inspected and found  to be extremely thin with multiple scleral dehiscences, especially in the  upper temporal quadrant where the retinal tear was located.  It was felt  that lamellar scleral dissection was contraindicated in this patient.  Localization of the retinal tear was then performed using indirect  ophthalmoscopy and the cryoprobe to place cryo applications surrounding the  large irregular horseshoe retinal tear at the 10:30 position.  Good cryo  applications could be seen and retinal edema around the tear.  It was then  elected to place the solid silicone implants externally on the scleral  surface.  A #277 solid silicone implant was held in place with two 4-0  Mersilene sutures.  An encircling  silicone #240 band was placed about the  globe at the equator, tied with two sutures at the 7:30 position.  Anchoring  sutures of 5-0 white Dacron were placed at the 8, 4 and 2 o'clock position  to hold the encircling band in place.  After repeat indirect ophthalmoscopy,  it was elected to drain fluid in the bed at the 9:30 position.  Incision was  made through the sclera and a 5-0 white Dacron suture placed across the  scleral incision.  The choroid was treated with diathermy and then  perforated with the pen electrode.  An abundant amount of clear subretinal  fluid drained.  The scleral sutures were pulled up creating a good scleral  indentation, the eye was quite hypotonous and it was elected to inject 100%  C3F8 0.4 mL into the vitreous cavity to re-establish the intraocular  pressure and to tamponade the retinal tear against the scleral buckling  indentation.  This was performed without complication and the  retinal  circulation was monitored and felt to be patent at all times.  It was then  elected to close.  The scleral incision was closed with the preplaced suture  5-0 white Dacron.  The scleral buckle indentation was created with the two 4-  0 green Mersilene sutures.  The tension of the encircling band was adjusted.  Tenon's capsule was then pulled forward in the four quadrants and tied as a  separate layer.  Full strength Neosporin was irrigated in the subtenon's  space.  The conjunctiva was then pulled forward and closed with a running 6-  0 chromic gut suture.  Final inspection of the eye with indirect  ophthalmoscopy revealed a clear vitreous with a moderate intravitreal SF6  bubble.  The retinal tear appeared to be in good position over the implant  surface.  A small minimal amount of subretinal fluid remained and it was  noted that the patient had a choroidal inferior detachment, probably  associated with his temporary hypotony.  Depo-Garamycin and dexamethasone  were  injected in the subtenon's space inferiorly and Maxitrol and atropine  ointment instilled in the conjunctival cul-de-sac.  A light patch and  protector shield were applied.  The patient was placed on his left side  postoperatively.  Duration of procedure was 1-1/2 hours.  The patient  tolerated the procedure well in general and left the operating room for the  recovery room and subsequently to the 23 hour observation unit.                                               Guadelupe Sabin, M.D.    HNJ/MEDQ  D:  02/12/2004  T:  02/14/2004  Job:  [id number]   cc:   Richarda Overlie, M.D.  [address] [place]  [place] [address]  Fax: [phone]   Ike Bene, M.D.  [address]  [place]  [place] [address]  Fax: [phone]

## 2010-11-24 NOTE — H&P (Signed)
NAME:  Justin Deleon, SHANKAR NO.:  [id number]   MEDICAL RECORD NO.:  [id number]                   PATIENT TYPE:  OIB   LOCATION:  5736                                 FACILITY:  [facility]   PHYSICIAN:  Guadelupe Sabin, M.D.             DATE OF BIRTH:  1925-11-15   DATE OF ADMISSION:  01/29/2004  DATE OF DISCHARGE:  01/31/2004                                HISTORY & PHYSICAL   This was an urgent outpatient admission of this 77 year old white male  admitted with a rhegmatogenous retinal detachment of the right eye.   HISTORY OF PRESENT ILLNESS:  This patient, while sitting in his office,  noted the sudden onset of blurred vision in his one eye.  On closing the  left eye, a visual field defect was noted.  The patient was seen by Dr. Mckinley Jewel who noted a retinal detachment.  The patient was referred to my  office where this diagnosis was confirmed and arrangements made for his  retinal detachment surgery.   PAST MEDICAL HISTORY:  The patient is in good general health, taking minimal  medications including Flomax, Prevacid, iron supplements, and other  multivitamins.  The patient's regular physician is Dr. Merril Abbe.  The  patient is felt to be in satisfactory condition for the proposed retinal  detachment surgery.   REVIEW OF SYSTEMS:  No cardiorespiratory complaints.   PHYSICAL EXAMINATION:  VITAL SIGNS:  As recorded on admission, blood  pressure 153/86, pulse 65, respirations 16, temperature 97.7.  GENERAL:  The patient is a pleasant, well-nourished, well-developed white  male in acute ocular distress.  HEENT:  Eyes:  Visual acuity without correction 20/300 right eye, 20/30 left  eye; with correction 20/300 right eye, 20/25 left eye.  Applanation  tonometry:  16 mm right eye, 16 left eye.  External ocular exam:  There is a  slight lower lid ectropion of the left lower lid with increasing injection  and eversion of the lacrimal punctum.  The corneas  are clear, anterior  chamber deep and clear.  The patient has had previous interocular lens  implants performed on both eyes with cataract extraction.  Dilated detailed  fundus examination right eye shows a temporal bullous rhegmatogenous retinal  detachment with a large irregular tear at the 10:30 position.  The macular  area is detached.  The left eye reveals a clear vitreous attached retina  with normal optic nerve, blood vessels, and macula.  CHEST:  Lungs clear to percussion and auscultation.  HEART:  Normal sinus rhythm.  No cardiomegaly, no murmurs.  ABDOMEN:  Negative.  EXTREMITIES:  Negative.   ADMISSION DIAGNOSES:  1. Rhegmatogenous retinal detachment, right eye.  2. Pseudophakia both eyes.   SURGICAL PLAN:  Scleral buckling procedure, right eye, with possible  vitrectomy.  The patient has been given oral discussion and printed  information concerning retinal detachment surgery.  The patient has signed  an informed  consent and arrangements made for his outpatient admission at  this time.                                                Guadelupe Sabin, M.D.    HNJ/MEDQ  D:  02/12/2004  T:  02/12/2004  Job:  [id number]   cc:   Ike Bene, M.D.  [address]  [place]  [place] [address]  Fax: [phone]   Richarda Overlie, M.D.  [address] [place]  [place] [address]  Fax: [phone]

## 2010-11-24 NOTE — Op Note (Signed)
NAME:  Justin Deleon, Justin Deleon NO.:  [id number]   MEDICAL RECORD NO.:  [id number]                   PATIENT TYPE:  AMB   LOCATION:  ENDO                                 FACILITY:  [facility]   PHYSICIAN:  Danise Edge, M.D.                DATE OF BIRTH:  1925/12/16   DATE OF PROCEDURE:  03/08/2004  DATE OF DISCHARGE:                                 OPERATIVE REPORT   PROCEDURE:  Esophagogastroduodenoscopy.   INDICATIONS FOR PROCEDURE:  [name] is a 77 year old male born  1925/12/30.  [name] has been seen in the emergency room on two  occasions complaining of severe epigastric--retrosternal intense pain  radiating down his arms and into his back.  His cardiac enzymes and  electrocardiograms in the emergency room have been normal. He is on a proton  pump inhibitor or suspected gastroesophageal reflux although he reports  infrequent heartburn and no dysphagia or odynophagia.   His pain sounded most consistent with attacks of biliary colic.  Yesterday  his gallbladder ultrasound revealed cholesterol crystals with sludge but no  definite stones.  Following an attack, his lipase and hepatic profile was  normal. He does have an umbilical hernia which is reducible with some degree  of difficulty.  The symptoms do not sound compatible with intestinal  ischemia or small bowel obstruction.   [name] is scheduled for esophagogastroduodenoscopy.  If normal, I will  refer him to surgery for a laparoscopic cholecystectomy and umbilical  herniorrhaphy.   ENDOSCOPIST:  Danise Edge, M.D.   PREMEDICATION:  Versed 5 mg, Demerol 40 mg.   DESCRIPTION OF PROCEDURE:  After obtaining informed consent, [name] was  placed in the left lateral decubitus position. I administered intravenous  Demerol and intravenous Versed to achieve conscious sedation for the  procedure. The patient's blood pressure, oxygen saturation and cardiac  rhythm were monitored  throughout the procedure and documented in the medical  record.   The Olympus gastroscope was passed through the posterior hypopharynx into  the proximal esophagus without difficulty. The hypopharynx, larynx and vocal  cords appeared normal.   ESOPHAGOSCOPY:  The proximal, mid and lower segments of the esophageal  mucosa appear completely normal.  The squamocolumnar junction and  esophagogastric junction are noted at 40 cm from the incisor teeth.   GASTROSCOPY:  Retroflexed view of the gastric cardia and fundus was normal.  The gastric body, antrum and pylorus appear normal.   DUODENOSCOPY:  The duodenal bulb and descending duodenum appear normal.   ASSESSMENT:  Normal esophagogastroduodenoscopy.   RECOMMENDATIONS:  Continue proton pump inhibitor therapy.  Surgical  consultation for possible laparoscopic cholecystectomy and umbilical  herniorrhaphy. As yet, I have not obtained a hepatobiliary scan but I am  suspicious that the quality of this pain is most compatible with biliary  colic.  Danise Edge, M.D.    MJ/MEDQ  D:  03/08/2004  T:  03/08/2004  Job:  [id number]   cc:   Ike Bene, M.D.  [address]  [place]  [place] [address]  Fax: [phone]

## 2010-11-24 NOTE — Discharge Summary (Signed)
NAMEBRODIN, GELPI NO.:  [id number]   MEDICAL RECORD NO.:  [id number]                   PATIENT TYPE:  INP   LOCATION:  0484                                 FACILITY:  [facility]   PHYSICIAN:  Angelia Mould. Derrell Lolling, M.D.             DATE OF BIRTH:  1926/06/13   DATE OF ADMISSION:  03/15/2004  DATE OF DISCHARGE:  03/18/2004                                 DISCHARGE SUMMARY   FINAL DIAGNOSES:  1.  Chronic cholecystitis.  2.  Incarcerated ventral hernia.  3.  Umbilical hernia.  4.  Hypertension.  5.  History of prostate cancer.  6.  Gastroesophageal reflux disease.  7.  Postoperative wound hemorrhage, resolved.   OPERATIONS PERFORMED:  Laparoscopic cholecystectomy with cholangiogram,  repair of incarcerated umbilical and ventral hernias.  Date of surgery  March 15, 2004.   HISTORY:  This is a 77 year old white man who presented with a two-week  history of daily postprandial epigastric pain, substernal pain, and back  pain but no nausea.  He has found that he can control his pain by eating  smaller meals and low-fat meals.  He was treated medically by increasing his  proton pump inhibitors and the use of Zelnorm.  A gallbladder ultrasound  showed floating cholesterol crystals and sludge but no shadowing gallstones.  Dr. Danise Edge performed an upper endoscopy which was normal.  The  patient was sent for consideration of cholecystectomy and ventral hernia  repair.  I felt that it was reasonable to proceed with this due to the  fairly characteristic nature of his symptoms and the presence of the  cholesterol crystals in his gallbladder and the presence of the hernias.  He  was brought to the hospital electively.   PHYSICAL EXAMINATION:  GENERAL:  A pleasant elderly man in no distress.  VITAL SIGNS:  Weight 199, blood pressure 146/75, pulse 65.  NECK:  No mass.  LUNGS:  Clear to auscultation.  HEART:  Regular rate and rhythm.  No murmur.  ABDOMEN:   Soft, nontender.  Liver and spleen not enlarged.  There is a  reducible umbilical hernia about 2 cm in size.  There is a nonreducible soft  tissue mass above the umbilicus about 5 cm in size consistent either with a  large lipoma or an incarcerated hernia.   HOSPITAL COURSE:  On the day of admission, the patient was taken to the  operating room.  We performed a laparoscopic cholecystectomy through an  infraumbilical camera port and the cholecystectomy and the cholangiogram  went well.  I did find that he had a large hernia above the umbilicus which  was repaired in an open technique on the way out.  He did well from that.   Postoperatively, he did reasonably well.  He had a few PVC's in the recovery  room.  We found that his potassium was 3.4.  We replaced that and he had no  further arrhythmias.  He advanced his diet and activities slowly.  On  March 17, 2004, he was having some slow oozing from his incision and I  found that he had some skin bleeders which was controlled with nylon  sutures.  After that, he did well advancing his diet and activities and was  ready to  go home on March 18, 2004.  At that time, he was eating okay, had not  really had a bowel movement and passing a lot of gas, tolerating his diet  and felt well.  His wounds looked fine.  He was asked to return to see me in  the office in 6-10 days.      HMI/MEDQ  D:  03/28/2004  T:  03/28/2004  Job:  [id number]   cc:   Ike Bene, M.D.  [address]  [place]  [place] [address]  Fax: [phone]   Danise Edge, M.D.  [address]  [place]  [place] [address]  Fax: [phone]

## 2011-03-10 HISTORY — PX: FRACTURE SURGERY: SHX138

## 2011-03-28 LAB — BASIC METABOLIC PANEL
BUN: 28 — ABNORMAL HIGH
CO2: 28
Calcium: 9.5
Chloride: 96
Creatinine, Ser: 1.29
GFR calc Af Amer: >60
GFR calc non Af Amer: 54 — ABNORMAL LOW
Glucose, Bld: 74
Potassium: 4.2
Sodium: 130 — ABNORMAL LOW

## 2011-03-28 LAB — CBC
HCT: 44.1
Hemoglobin: 15
MCHC: 34.1
MCV: 97.6
Platelets: 258
RBC: 4.52
RDW: 13.5
WBC: 12 — ABNORMAL HIGH

## 2011-03-29 ENCOUNTER — Emergency Department (INDEPENDENT_AMBULATORY_CARE_PROVIDER_SITE_OTHER): Payer: Medicare Other

## 2011-03-29 ENCOUNTER — Emergency Department (HOSPITAL_BASED_OUTPATIENT_CLINIC_OR_DEPARTMENT_OTHER)
Admission: EM | Admit: 2011-03-29 | Discharge: 2011-03-30 | Disposition: A | Discharge status: Other Acute Inpatient Hospital | Payer: Medicare Other | Source: Home / Self Care | Attending: Emergency Medicine | Admitting: Emergency Medicine

## 2011-03-29 ENCOUNTER — Encounter: Payer: Self-pay | Admitting: *Deleted

## 2011-03-29 DIAGNOSIS — I10 Essential (primary) hypertension: Secondary | ICD-10-CM | POA: Insufficient documentation

## 2011-03-29 DIAGNOSIS — Z538 Procedure and treatment not carried out for other reasons: Secondary | ICD-10-CM

## 2011-03-29 DIAGNOSIS — W19XXXA Unspecified fall, initial encounter: Secondary | ICD-10-CM

## 2011-03-29 DIAGNOSIS — S7292XA Unspecified fracture of left femur, initial encounter for closed fracture: Secondary | ICD-10-CM

## 2011-03-29 DIAGNOSIS — S7290XA Unspecified fracture of unspecified femur, initial encounter for closed fracture: Secondary | ICD-10-CM | POA: Insufficient documentation

## 2011-03-29 DIAGNOSIS — M25559 Pain in unspecified hip: Secondary | ICD-10-CM | POA: Insufficient documentation

## 2011-03-29 DIAGNOSIS — Z79899 Other long term (current) drug therapy: Secondary | ICD-10-CM | POA: Insufficient documentation

## 2011-03-29 DIAGNOSIS — Y9210 Unspecified residential institution as the place of occurrence of the external cause: Secondary | ICD-10-CM | POA: Insufficient documentation

## 2011-03-29 HISTORY — DX: Depression, unspecified: F32.A

## 2011-03-29 HISTORY — DX: Malignant neoplasm of prostate: C61

## 2011-03-29 HISTORY — DX: Major depressive disorder, single episode, unspecified: F32.9

## 2011-03-29 HISTORY — DX: Calculus of kidney: N20.0

## 2011-03-29 HISTORY — DX: Unspecified abdominal hernia without obstruction or gangrene: K46.9

## 2011-03-29 HISTORY — DX: Essential (primary) hypertension: I10

## 2011-03-29 NOTE — ED Notes (Signed)
Brought in by EMS for fall pt from [facility],

## 2011-03-29 NOTE — ED Provider Notes (Addendum)
History     CSN: [id number] Arrival date & time: 03/29/2011 11:07 PM  Chief Complaint  Patient presents with  . Fall    HPI  (Consider location/radiation/quality/duration/timing/severity/associated sxs/prior treatment)  HPI Pt presents after mechanical fall at assisted living facility.  States he had just reached to get something from his dresser, then turned to walk, stumbled and fell.  Pain in left hip radiating into left knee.  Also pain in left elbow- skin tear with bleeding.  Denies striking head, no LOC, no amnesia of fall, denies neck or back pain.  States he had been in his usual state of health prior to fall, no chest pain, no headache, palpitations or difficulty breathing preceding  Past Medical History  Diagnosis Date  . Depressed   . Hernia   . Kidney stones   . Prostate cancer   . Hypertension   heart murmur GERD  History reviewed. No pertinent past surgical history. Hx of back surgery several years ago  History reviewed. No pertinent family history.  History  Substance Use Topics  . Smoking status: Not on file  . Smokeless tobacco: Not on file  . Alcohol Use: No  Lives at Assisted Living    Review of Systems  Review of Systems ROS reviewed and otherwise negative except for mentioned in HPI  Allergies  Review of patient's allergies indicates no known allergies.  Home Medications   Current Outpatient Rx  Name Route Sig Dispense Refill  . ACETAMINOPHEN-CODEINE #3 300-30 MG PO TABS Oral Take 1 tablet by mouth every 12 (twelve) hours as needed. For extremity pain     . ASPIRIN 81 MG PO TABS Oral Take 81 mg by mouth daily.      . OCUVITE PO TABS Oral Take 1 tablet by mouth daily.     Marland Kitchen BISACODYL 5 MG PO TBEC Oral Take 10 mg by mouth daily.      . BUPROPION HCL (XL) 150 MG PO TB24 Oral Take 300 mg by mouth daily.      Marland Kitchen VITAMIN D 2000 UNITS PO TABS Oral Take 2,000 Units by mouth daily.      Marland Kitchen DOCUSATE SODIUM 100 MG PO CAPS Oral Take 300 mg by mouth  daily.      Marland Kitchen HYDROCHLOROTHIAZIDE 25 MG PO TABS Oral Take 12.5 mg by mouth daily.      . MULTI-VITAMIN/MINERALS PO TABS Oral Take 1 tablet by mouth as directed.     Marland Kitchen OMEPRAZOLE 20 MG PO CPDR Oral Take 20 mg by mouth daily.      Marland Kitchen POLYETHYLENE GLYCOL 3350 PO POWD Oral Take 17 g by mouth daily.      . RED YEAST RICE 600 MG PO TABS Oral Take 1 tablet by mouth as directed.      Marland Kitchen TAMSULOSIN HCL 0.4 MG PO CAPS Oral Take 0.8 mg by mouth daily.      Marland Kitchen TEMAZEPAM 15 MG PO CAPS Oral Take 45 mg by mouth daily as needed. For sleep     . TRAMADOL HCL 50 MG PO TABS Oral Take 100 mg by mouth daily.      Marland Kitchen VITAMIN E 1000 UNITS PO CAPS Oral Take 1,000 Units by mouth daily.        Physical Exam    BP 130/55  Pulse 66  Temp(Src) 98 F (36.7 C) (Oral)  Resp 18  SpO2 95% Vitals reviewed by me Physical Exam Physical Examination: General appearance - alert, well appearing, and in no distress  Mental status - alert, oriented to person, place, and time Eyes - pupils equal and reactive, extraocular eye movements intact Neck - supple, no significant adenopathy, no midline c spine tenderness Chest - clear to auscultation, no wheezes, rales or rhonchi, symmetric air entry Heart - normal rate and regular rhythm, S1 and S2 normal, systolic murmur 3/6 at 2nd left intercostal space Abdomen - soft, nontender, nondistended, no masses or organomegaly Back exam - no midline spinal tenderness Neurological - alert, oriented, normal speech, no focal findings or movement disorder noted, extremities distally neurologically intact Musculoskeletal - ttp over left lateral hip, although FROM, left knee exam normal- nontender, from, mild ttp over left lateral elbow Extremities - peripheral pulses normal, no pedal edema, no clubbing or cyanosis Skin - approx 3cm skin tear over left elbow, no active bleeding  ED Course  Procedures (including critical care time) Went to see patient, he is not in room, may be in xray, will  recheck shortly   Date: 03/30/2011  Rate: 74  Rhythm: normal sinus rhythm  QRS Axis: LAD  Intervals: PR prolonged  ST/T Wave abnormalities: normal  Conduction Disutrbances:first-degree A-V block   Narrative Interpretation: SR with FAVB, lad, otherwise no acute abnormalities- prior ekg 07/22/07  Old EKG Reviewed: changes noted and PR interval prolonged, left axis deviation new    Labs Reviewed - No data to display No results found.   No diagnosis found.   MDM 2:05 AM Pt with right minimally displaced femoral neck fracture.  CXR and elbow films also reviewed by me- labs obtained, pain meds ordered.  Discussed results with patient and son at bedside.  They have no preference for orthopedic physician.  Paging ortho now for admission. Pt agreeable with plan.       2:18 AM D/w Dr. Lestine Box, pt accepted for transfer to [facility].    Ethelda Chick, MD 03/30/11 1610  Ethelda Chick, MD 03/30/11 9604  Ethelda Chick, MD 04/10/11 1231  Ethelda Chick, MD 04/23/11 [phone]

## 2011-03-30 ENCOUNTER — Emergency Department (INDEPENDENT_AMBULATORY_CARE_PROVIDER_SITE_OTHER): Payer: Medicare Other

## 2011-03-30 ENCOUNTER — Inpatient Hospital Stay (HOSPITAL_COMMUNITY): Payer: Medicare Other

## 2011-03-30 ENCOUNTER — Encounter (HOSPITAL_BASED_OUTPATIENT_CLINIC_OR_DEPARTMENT_OTHER): Payer: Self-pay | Admitting: *Deleted

## 2011-03-30 ENCOUNTER — Inpatient Hospital Stay (HOSPITAL_COMMUNITY)
Admission: AD | Admit: 2011-03-30 | Discharge: 2011-04-04 | DRG: 482 | Disposition: A | Discharge status: Skilled Nursing Facility | Payer: Medicare Other | Source: Other Acute Inpatient Hospital | Attending: Orthopedic Surgery | Admitting: Orthopedic Surgery

## 2011-03-30 ENCOUNTER — Other Ambulatory Visit: Payer: Self-pay

## 2011-03-30 DIAGNOSIS — W19XXXA Unspecified fall, initial encounter: Secondary | ICD-10-CM

## 2011-03-30 DIAGNOSIS — S72009A Fracture of unspecified part of neck of unspecified femur, initial encounter for closed fracture: Principal | ICD-10-CM | POA: Diagnosis present

## 2011-03-30 DIAGNOSIS — I517 Cardiomegaly: Secondary | ICD-10-CM

## 2011-03-30 DIAGNOSIS — IMO0002 Reserved for concepts with insufficient information to code with codable children: Secondary | ICD-10-CM | POA: Diagnosis present

## 2011-03-30 DIAGNOSIS — D72829 Elevated white blood cell count, unspecified: Secondary | ICD-10-CM | POA: Diagnosis present

## 2011-03-30 DIAGNOSIS — M25559 Pain in unspecified hip: Secondary | ICD-10-CM

## 2011-03-30 DIAGNOSIS — W010XXA Fall on same level from slipping, tripping and stumbling without subsequent striking against object, initial encounter: Secondary | ICD-10-CM | POA: Diagnosis present

## 2011-03-30 DIAGNOSIS — K219 Gastro-esophageal reflux disease without esophagitis: Secondary | ICD-10-CM | POA: Diagnosis present

## 2011-03-30 DIAGNOSIS — F3289 Other specified depressive episodes: Secondary | ICD-10-CM | POA: Diagnosis present

## 2011-03-30 DIAGNOSIS — I10 Essential (primary) hypertension: Secondary | ICD-10-CM | POA: Diagnosis present

## 2011-03-30 DIAGNOSIS — M25529 Pain in unspecified elbow: Secondary | ICD-10-CM

## 2011-03-30 DIAGNOSIS — N400 Enlarged prostate without lower urinary tract symptoms: Secondary | ICD-10-CM | POA: Diagnosis present

## 2011-03-30 DIAGNOSIS — D509 Iron deficiency anemia, unspecified: Secondary | ICD-10-CM | POA: Diagnosis present

## 2011-03-30 DIAGNOSIS — F329 Major depressive disorder, single episode, unspecified: Secondary | ICD-10-CM | POA: Diagnosis present

## 2011-03-30 LAB — CBC
HCT: 31.5 % — ABNORMAL LOW (ref 39.0–52.0)
Hemoglobin: 10.4 g/dL — ABNORMAL LOW (ref 13.0–17.0)
MCH: 28.2 pg (ref 26.0–34.0)
MCHC: 33 g/dL (ref 30.0–36.0)
MCHC: 32.9 g/dL (ref 30.0–36.0)
MCV: 85.4 fL (ref 78.0–100.0)
Platelets: 309 10*3/uL (ref 150–400)
RBC: 3.67 MIL/uL — ABNORMAL LOW (ref 4.22–5.81)
RDW: 14.6 % (ref 11.5–15.5)

## 2011-03-30 LAB — MRSA PCR SCREENING: MRSA by PCR: INVALID — AB

## 2011-03-30 LAB — PROTIME-INR
INR: 0.97 (ref 0.00–1.49)
INR: 1.02 (ref 0.00–1.49)
Prothrombin Time: 13.1 seconds (ref 11.6–15.2)
Prothrombin Time: 13.6 seconds (ref 11.6–15.2)

## 2011-03-30 LAB — DIFFERENTIAL
Basophils Absolute: 0 10*3/uL (ref 0.0–0.1)
Basophils Relative: 0 % (ref 0–1)
Eosinophils Absolute: 0.1 10*3/uL (ref 0.0–0.7)
Monocytes Absolute: 1.3 10*3/uL — ABNORMAL HIGH (ref 0.1–1.0)
Neutro Abs: 8.5 10*3/uL — ABNORMAL HIGH (ref 1.7–7.7)
Neutrophils Relative %: 77 % (ref 43–77)

## 2011-03-30 LAB — BASIC METABOLIC PANEL
GFR calc non Af Amer: >60 mL/min (ref >60)
Glucose, Bld: 94 mg/dL (ref 70–99)
Potassium: 3.8 mEq/L (ref 3.5–5.1)
Sodium: 135 mEq/L (ref 135–145)

## 2011-03-30 LAB — APTT: aPTT: 33 seconds (ref 24–37)

## 2011-03-30 LAB — URINALYSIS, ROUTINE W REFLEX MICROSCOPIC
Bilirubin Urine: NEGATIVE
Hgb urine dipstick: NEGATIVE
Ketones, ur: NEGATIVE mg/dL
Leukocytes, UA: NEGATIVE
Protein, ur: NEGATIVE mg/dL
Specific Gravity, Urine: 1.01 (ref 1.005–1.030)
Urobilinogen, UA: 0.2 mg/dL (ref 0.0–1.0)

## 2011-03-30 LAB — COMPREHENSIVE METABOLIC PANEL
Albumin: 3.8 g/dL (ref 3.5–5.2)
BUN: 14 mg/dL (ref 6–23)
Creatinine, Ser: 1.1 mg/dL (ref 0.50–1.35)
Total Protein: 6.4 g/dL (ref 6.0–8.3)

## 2011-03-30 MED ORDER — ONDANSETRON HCL 4 MG/2ML IJ SOLN
4.0000 mg | Freq: Once | INTRAMUSCULAR | Status: AC
  Administered 2011-03-30: 4 mg via INTRAVENOUS
  Filled 2011-03-30: qty 2

## 2011-03-30 MED ORDER — TETANUS-DIPHTHERIA TOXOIDS TD 5-2 LFU IM INJ
0.5000 mL | INJECTION | Freq: Once | INTRAMUSCULAR | Status: AC
  Administered 2011-03-30: 0.5 mL via INTRAMUSCULAR
  Filled 2011-03-30: qty 0.5

## 2011-03-30 MED ORDER — MORPHINE SULFATE 4 MG/ML IJ SOLN
4.0000 mg | Freq: Once | INTRAMUSCULAR | Status: AC
  Administered 2011-03-30: 4 mg via INTRAVENOUS
  Filled 2011-03-30: qty 1

## 2011-03-30 NOTE — ED Notes (Signed)
Morphine and Zofran given at 03:25, not 01:15.

## 2011-03-30 NOTE — ED Notes (Addendum)
Pt refusing pain medicine at this time and sts he will let me know if he needs something. Skin tear of left elbow covered with non-adhering gauze, 4x4's and kerlex.

## 2011-03-30 NOTE — ED Notes (Signed)
IV not actually removed from pt. Pt transported with iv access intact.

## 2011-03-31 LAB — CBC
HCT: 31.7 % — ABNORMAL LOW (ref 39.0–52.0)
Hemoglobin: 10 g/dL — ABNORMAL LOW (ref 13.0–17.0)
MCHC: 31.5 g/dL (ref 30.0–36.0)
RDW: 15.2 % (ref 11.5–15.5)
WBC: 8.8 10*3/uL (ref 4.0–10.5)

## 2011-03-31 LAB — BASIC METABOLIC PANEL
BUN: 11 mg/dL (ref 6–23)
Chloride: 96 mEq/L (ref 96–112)
GFR calc Af Amer: >60 mL/min (ref >60)
GFR calc non Af Amer: >60 mL/min (ref >60)
Glucose, Bld: 124 mg/dL — ABNORMAL HIGH (ref 70–99)
Potassium: 3.6 mEq/L (ref 3.5–5.1)
Sodium: 133 mEq/L — ABNORMAL LOW (ref 135–145)

## 2011-03-31 LAB — IRON AND TIBC
Saturation Ratios: 28 % (ref 20–55)
TIBC: 321 ug/dL (ref 215–435)
UIBC: 230 ug/dL (ref 125–400)

## 2011-04-01 LAB — BASIC METABOLIC PANEL
BUN: 11 mg/dL (ref 6–23)
CO2: 28 mEq/L (ref 19–32)
Calcium: 8.4 mg/dL (ref 8.4–10.5)
Chloride: 99 mEq/L (ref 96–112)
Creatinine, Ser: 1.02 mg/dL (ref 0.50–1.35)
Glucose, Bld: 102 mg/dL — ABNORMAL HIGH (ref 70–99)

## 2011-04-01 LAB — CBC
HCT: 30.2 % — ABNORMAL LOW (ref 39.0–52.0)
MCH: 28.4 pg (ref 26.0–34.0)
MCV: 87.5 fL (ref 78.0–100.0)
RBC: 3.45 MIL/uL — ABNORMAL LOW (ref 4.22–5.81)
WBC: 9.2 10*3/uL (ref 4.0–10.5)

## 2011-04-02 DIAGNOSIS — S72009A Fracture of unspecified part of neck of unspecified femur, initial encounter for closed fracture: Secondary | ICD-10-CM

## 2011-04-02 LAB — CBC
HCT: 31 % — ABNORMAL LOW (ref 39.0–52.0)
Hemoglobin: 10 g/dL — ABNORMAL LOW (ref 13.0–17.0)
RBC: 3.56 MIL/uL — ABNORMAL LOW (ref 4.22–5.81)
RDW: 15.1 % (ref 11.5–15.5)
WBC: 6.4 10*3/uL (ref 4.0–10.5)

## 2011-04-02 LAB — MRSA CULTURE

## 2011-04-05 NOTE — Consult Note (Signed)
NAMEGARIK, DIAMANT NO.:  [id number]  MEDICAL RECORD NO.:  [id number]  LOCATION:  1612                         FACILITY:  [facility]  PHYSICIAN:  Elliot Cousin, M.D.    DATE OF BIRTH:  [birth year]  DATE OF CONSULTATION:  03/30/2011 DATE OF DISCHARGE:                                CONSULTATION   PRIMARY CARE PHYSICIAN:  Deirdre Peer. Polite, MD  REASON FOR CONSULTATION:  Medical evaluation for surgery and management of medical conditions.  HISTORY OF PRESENT ILLNESS:  The patient is an 77 year old man with a past medical history significant for peripheral vascular disease, hypertension, depression, and prostate cancer.  He was admitted by Dr. Lestine Box for surgical repair of a left femoral neck fracture following a fall at home.  The patient reports that he misstepped while reaching for an item on his dresser.  He fell on his left side and felt immediate pain in his left hip.  He denies any preceding syncope, dizziness, chest pain, palpitations, or shortness of breath.  He reports no history of coronary artery disease, congestive heart failure, diabetes mellitus, or COPD.  He performs some type of exercise at least 5 days per week.  PAST MEDICAL HISTORY: 1. Hypertension. 2. Chronic systolic heart murmur. 3. Depression. 4. Peripheral vascular disease. 5. Status post L3-L4 lumbar surgery. 6. Status post L4-L5 lumbar surgery. 7. History of prostate cancer, status post seed implantation in     October 2002. 8. Status post hernia repairs. 9. Status post laparoscopic cholecystectomy. 10.Status post retinal repairs.  HOME MEDICATIONS:  As noted on the medication reconciliation form.  ALLERGIES:  No known drug allergies.  SOCIAL HISTORY:  The patient is a resident of a [facility] [address].  He is married.  He is a retired Insurance underwriter.  He has 3 children.  He denies tobacco, alcohol, and illicit drug use.  He walks or performs some type of exercise for at least  30 minutes 5 days per week.  FAMILY HISTORY:  His mother died of old age at 76 years of age.  His father died of a heart attack at 63 years of age.  REVIEW OF SYSTEMS:  The patient's review of systems is positive for occasional arthritic pain in his lower back.  Otherwise his review of systems is negative.  PHYSICAL EXAMINATION:  VITAL SIGNS:  Temperature 98.1, pulse 71, respiratory rate 18, blood pressure 155/73, oxygen saturation 94% on room air. GENERAL:  The patient is a pleasant, alert, 77 year old Caucasian man, who is currently lying in bed, in no acute distress. HEENT:  Head is normocephalic nontraumatic.  Pupils equal, round, reactive to light.  Extraocular muscles are intact.  Conjunctivae are clear.  Sclerae are white.  Tympanic membranes not examined.  Oropharynx reveals mildly dry mucous membranes.  No posterior exudates or erythema. His teeth are in ill repair. NECK:  Supple.  No adenopathy, no thyromegaly, no bruit, no JVD. LUNGS:  Decreased breath sounds at the bases, otherwise clear. HEART:  S1 and S2 with a 2/6 to 3/6 systolic murmur. ABDOMEN:  Positive bowel sounds, soft, nontender, nondistended.  No hepatosplenomegaly.  No masses palpated. GU and RECTAL:  Deferred. EXTREMITIES:  Moderate  edema and tenderness over the left hip. Otherwise no pretibial edema and no pedal edema. NEUROLOGIC:  The patient is alert and oriented x3.  Cranial nerves II through XII are intact.  Strength is 5/5 throughout with exception of the left lower extremity, which was not tested for strength.  Sensation grossly intact.  ADMISSION LABORATORY DATA:  EKG reveals normal sinus rhythm with a heart rate of 74 beats per minute with no ST or T-wave abnormalities.  Chest x- ray reveals cardiomegaly, vascular fullness, left lower lobe scarring versus atelectasis.  X-ray of the pelvis revealed a left femoral neck fracture with valgus angulation.  WBC 11.1, hemoglobin 10.4, platelet count 309.   Sodium 135, potassium 3.8, chloride 100, CO2 28, glucose 94, BUN 13, creatinine 0.99, calcium 9.4.  PT 13.6, INR 1.02, PTT 33. Urinalysis essentially negative.  ASSESSMENT:  The patient is an 77 year old man with a mechanical fall and resultant acute left hip fracture.  There was no evidence of syncope, vertigo, or preceding presyncope prior to his fall.  He does have an impressive systolic heart murmur on exam, however, he has had no history of congestive heart failure.  He says that he was evaluated by a cardiologist in the past.  There was no indication for treatment or surgery according to him.  He has no history of coronary artery disease and no history of COPD.  He impressively exercises at least 5 days weekly.  His EKG and chest x-ray are unremarkable.  He is at a low-to- moderate perioperative risk.  He is therefore medically cleared for surgery given his unremarkable cardiac and pulmonary histories. 1. Normocytic anemia.  The patient's hemoglobin is noted to be 10.4.     Of note, he had a colonoscopy in July 2003, which was essentially     normal.  He did have a history of colon polyps that were removed in     1998.  In August 2005 by Dr. Danise Edge, he underwent an EGD     which revealed a normal exam.  He denies any black tarry stools or     bright red blood per rectum. 2. Hypertension.  He is treated chronically with hydrochlorothiazide. 3. Mild leukocytosis.  This is likely reactive.  There is no evidence     of pneumonia on his chest x-ray.  His urinalysis is unremarkable.     He is afebrile.  PLAN: 1. Continue current management.  He is medically cleared for operative     repair of his left proximal femur     fracture. 2. For further evaluation, we will order an anemia panel, TSH, and     free T4. 3. Recommend resuming his home medications.     Elliot Cousin, M.D.     DF/MEDQ  D:  03/30/2011  T:  03/30/2011  Job:  [id number]  cc:   Deirdre Peer. Polite,  M.D. Fax: [phone]  Electronically Signed by Elliot Cousin M.D. on 04/05/2011 11:05:50 PM

## 2011-04-06 DIAGNOSIS — I739 Peripheral vascular disease, unspecified: Secondary | ICD-10-CM | POA: Insufficient documentation

## 2011-04-06 DIAGNOSIS — G4700 Insomnia, unspecified: Secondary | ICD-10-CM

## 2011-04-06 DIAGNOSIS — R011 Cardiac murmur, unspecified: Secondary | ICD-10-CM

## 2011-04-06 DIAGNOSIS — E785 Hyperlipidemia, unspecified: Secondary | ICD-10-CM

## 2011-04-06 DIAGNOSIS — D649 Anemia, unspecified: Secondary | ICD-10-CM

## 2011-04-06 DIAGNOSIS — K2100 Gastro-esophageal reflux disease with esophagitis, without bleeding: Secondary | ICD-10-CM

## 2011-04-06 DIAGNOSIS — K5900 Constipation, unspecified: Secondary | ICD-10-CM

## 2011-04-06 HISTORY — DX: Anemia, unspecified: D64.9

## 2011-04-06 HISTORY — DX: Hyperlipidemia, unspecified: E78.5

## 2011-04-06 HISTORY — DX: Cardiac murmur, unspecified: R01.1

## 2011-04-06 HISTORY — DX: Gastro-esophageal reflux disease with esophagitis, without bleeding: K21.00

## 2011-04-06 HISTORY — DX: Insomnia, unspecified: G47.00

## 2011-04-06 HISTORY — DX: Peripheral vascular disease, unspecified: I73.9

## 2011-04-06 HISTORY — DX: Constipation, unspecified: K59.00

## 2011-04-26 NOTE — Op Note (Signed)
  NAMEKADEEM, HYLE NO.:  [id number]  MEDICAL RECORD NO.:  [id number]  LOCATION:  1612                         FACILITY:  [facility]  PHYSICIAN:  Leonides Grills, M.D.     DATE OF BIRTH:  April 24, 1926  DATE OF PROCEDURE:  03/30/2011 DATE OF DISCHARGE:                              OPERATIVE REPORT   PREOPERATIVE DIAGNOSIS:  Left femoral neck fracture.  POSTOPERATIVE DIAGNOSIS:  Left femoral neck fracture.  OPERATION:  Closed reduction percutaneous screw fixation, left femoral neck fracture.  ANESTHESIA:  General.  SURGEON:  Leonides Grills, M.D.  ASSISTANT:  Richardean Canal, P.A.  ESTIMATED BLOOD LOSS:  Minimal.  COMPLICATIONS:  None.  IMPLANT:  ALPS 8 mm x1 and 6.5 mm x2 cannulated screws.  DISPOSITION:  Stable to PAR.  INDICATIONS:  This is an 77 year old gentleman, who fell and sustained the above injury yesterday.  He was consented to the above procedure. All risks infection or vessel injury, wound healing problems, nonunion, malunion, hardware irritation, hardware failure, possibility of screw cutout, DVT, and PE were all explained.  Questions were encouraged and answered.  OPERATION:  Patient brought to the operating room and placed in supine position.  After adequate general anesthesia was administered as well as Ancef 1 g IV piggyback, left hip was prepped and draped in sterile manner and under C-arm view a closed reduction was performed prior to prep and draping the left hip.  Once this was anatomically positioned, we then placed under sterile conditions the incision over the lateral aspect of the proximal femur.  A threaded guidewire was then placed under C-arm guidance, AP and lateral planes, just superior to the inferior aspect of the femoral neck.  Once this was placed, we then used a Gatlin gun type guide in placed, one K-wire on the posterior aspect of the cortex and then one relatively centrally located within the head. Once this was done in  C-arm guidance in AP and lateral planes, we then measured and chose an 8 mm inferior screw, which was 105 mm in length and then placed two 6.5 mm x 90 and 95 mm respectively in the remaining two K-wire positions.  K-wires were removed.  Final x-rays were obtained AP and lateral planes.  It showed anatomical maintenance of the fracture, fixation, proper position, excellent alignment as well.  Wound was copiously irrigated with normal saline.  Subcu was closed with 0 Vicryl.  Skin was closed with staples.  Sterile dressing was applied. Patient was stable to PAR.  Throughout the procedure, PA was used to help in aid and fixation of the above fracture.     Leonides Grills, M.D.     PB/MEDQ  D:  03/30/2011  T:  03/31/2011  Job:  [id number]  Electronically Signed by Leonides Grills M.D. on 04/26/2011 04:39:55 PM

## 2011-04-26 NOTE — Discharge Summary (Signed)
  [name], Justin Deleon NO.:  [id number]  MEDICAL RECORD NO.:  [id number]  LOCATION:  1612                         FACILITY:  [facility]  PHYSICIAN:  Leonides Grills, M.D.     DATE OF BIRTH:  06-07-1926  DATE OF ADMISSION:  03/30/2011 DATE OF DISCHARGE:  04/04/2011                              DISCHARGE SUMMARY   ADDENDUM:  The patient stable, afebrile, vital signs stable.  Wound remains benign.  Left hip remains benign.  The patient is stable for discharge to skilled nursing facility in good stable condition.     Richardean Canal, P.A.   ______________________________ Leonides Grills, M.D.    GC/MEDQ  D:  04/04/2011  T:  04/04/2011  Job:  [id number]  Electronically Signed by Richardean Canal P.A. on 04/06/2011 09:47:29 AM Electronically Signed by Leonides Grills M.D. on 04/26/2011 04:39:50 PM

## 2011-04-26 NOTE — H&P (Signed)
Justin Deleon, Justin Deleon NO.:  [id number]  MEDICAL RECORD NO.:  [id number]  LOCATION:  1612                         FACILITY:  [facility]  PHYSICIAN:  Leonides Grills, M.D.     DATE OF BIRTH:  Jun 15, 1926  DATE OF ADMISSION:  03/30/2011 DATE OF DISCHARGE:                             HISTORY & PHYSICAL   The patient is seen and examined by Richardean Canal, PA-C.  CHIEF COMPLAINT:  Left hip pain.  HISTORY OF PRESENT ILLNESS:  This is an 77 year old male who fell on February 26, 2011, while reaching to get something from his dresser.  He turned to walk, stumbled and fell injuring his left hip.  He denies any loss of consciousness, chest pain, shortness of breath, or head injury. He did suffer an abrasion to his left elbow.  The patient was taken to ED in [facility], where he was found to have a left hip fracture.  PAST MEDICAL HISTORY:  Depression, prostate cancer, hypertension, heart murmur, and GERD. PAST SURGICAL HISTORY:  Back surgery, cholecystectomy, and prostate surgery.  SOCIAL HISTORY:  Negative for EtOH, tobacco.  Lives in a retirement facility.  ALLERGIES:  None.  MEDICATIONS:  Please see chart.  REVIEW OF SYSTEMS:  Negative for chest pain, shortness of breath, fever, chills, or diabetes mellitus.  Positive for heart murmur with GERD.  PHYSICAL EXAMINATION:  VITAL SIGNS:  Temperature is 98.1, blood pressure is 155/73, pulse 71, respirations 18, and O2 saturation 94% on room air. GENERAL:  He is a well-developed, well-nourished male, in no acute distress. PSYCH:  Alert and oriented x3. CHEST:  Clear to auscultation bilaterally. CARDIAC:  He had a 3/6 holosystolic murmur. ABDOMEN:  Soft, nontender x4 quads. VASCULAR:  Dorsal pedal pulse 2+.  Calves supple and nontender. SKIN:  Abrasion, left elbow covered.  No rashes in lower extremities. No skin lesions in lower extremities. NEURO:  Sensation intact, L4-S1 bilateral lower extremities.  Lower Extremities,  bilateral tib-fib nontender.  Good range of motion of bilateral ankles without pain.  Nontender bilateral ankles with palpation.  EHL, FHL intact bilaterally.  Left hip externally rotated and slightly shortened compared to the right.  Bilateral feet, nontender throughout.  RADIOGRAPHS:  AP, pelvis and left hip shows a left femoral impacted neck fracture with valgus angulation.  LABORATORY DATA:  CBC:  White count 11,100, hemoglobin is 10.1, and hematocrit 31.6.  BMET:  Sodium 135, potassium 3.8, chloride 100, bicarb 28, BUN 13, creatinine 0.99, and glucose 94. PT/INR 13.6/1.02.  ASSESSMENT AND PLAN:  This is an 77 year old male with left femoral neck fracture impacted with valgus deformity.  PLAN: 1. Left hip percutaneous pinning later today. 2. Medicine consult for preop clearance. 3. Keep the patient n.p.o. 4. Strict bedrest.  Questions encouraged and answered.  Risks and     benefits reviewed with the patient today.  Risks include nerve or     vessel injury, wound healing problems DVT, PE, nonunion, malunion,     need for additional surgery.     Richardean Canal, P.A.   ______________________________ Leonides Grills, M.D.    GC/MEDQ  D:  03/30/2011  T:  03/30/2011  Job:  [id number]  Electronically Signed  by Richardean Canal P.A. on 04/03/2011 08:47:53 AM Electronically Signed by Leonides Grills M.D. on 04/26/2011 04:39:53 PM

## 2011-04-26 NOTE — Discharge Summary (Signed)
Justin Deleon, Justin Deleon NO.:  [id number]  MEDICAL RECORD NO.:  [id number]  LOCATION:  1612                         FACILITY:  [facility]  PHYSICIAN:  Leonides Grills, M.D.     DATE OF BIRTH:  12-24-1925  DATE OF ADMISSION:  03/30/2011 DATE OF DISCHARGE:                              DISCHARGE SUMMARY   ADMITTING DIAGNOSES: 1. Left femoral neck fracture. 2. Hypertension. 3. History of prostate cancer. 4. Gastroesophageal reflux disease. 5. Depression. 6. Normocytic anemia. 7. Mild leukocytosis.  DISCHARGE DIAGNOSES: 1. Status post closed reduction and percutaneous screw fixation, left     femoral neck fracture. 2. Hypertension. 3. Normocytic anemia. 4. Mild hyponatremia, resolved. 5. Leukocytosis, resolved. 6. History of prostate cancer. 7. Depression.  HISTORY OF PRESENT ILLNESS:  This is an 77 year old male who fell on March 29, 2011, sustaining a left femoral neck fracture.  The patient was admitted to undergo closed reduction and percutaneous screw fixation of left femoral neck fracture.  The patient denied any loss of consciousness, chest pain, shortness of breath, or dizziness at the time of the injury.  He did suffer abrasion to his left elbow.  SURGICAL PROCEDURE:  The patient was taken to the operating room on March 30, 2011 by Dr. Leonides Grills and assisted by Richardean Canal, PA- C.  The patient was placed under general anesthesia and then underwent a closed reduction and percutaneous screw fixation, left femoral neck fracture.  The patient tolerated the procedure well and returned to recovery in good stable condition.  HOSPITAL COURSE:  Hospital day #1, the patient was evaluated by Medicine preoperatively and was deemed to be low to moderate preoperative risk and was cleared for surgery.  Postop day #1, the patient afebrile, vital signs stable, he was doing well.  The patient noted to have mild hyponatremia with a sodium of  133. Leukocytosis resolved as 8800 on admission.  His white count was 11,100 and hemoglobin was 10.0 on postop day #1.  Anemia panel, TSH, T4, and labs were pending.  On postop day #2, the patient afebrile, vital signs stable.  Hemoglobin 9.8, hematocrit is 32, and  white count was 9200.  Iron 91 and ferritin 22.  T4 free was 1.26 and TSH was at 2.657.  The patient started on iron supplements.  The patient on Lovenox for a total of 3 months for DVT prophylaxis.  Sodium was 134.  Postop day #3, the patient doing well, voiding, positive bowel movement, tolerated diet well, afebrile, and vital signs stable.  Hemoglobin is 10, hematocrit 31.0, and white count 6400.  Calf supple, nontender. Dorsal pedal pulse present.  Sensation intact, L4-L5, left foot. Dressing clean, dry, and intact.  The patient progressing well with physical therapy.  Postop day #4, the patient afebrile, vital signs stable.  The patient's PEG tube is sustained.  He is having fair to little pinning pain in the left hip.  LAB DATA:  No labs.  INSTRUMENTATION:  Stable as well as approximated skin.  Calf supple, nontender.  Dorsal pedal pulse present.  Sensation intact, L4-L5, left side.  The patient awaiting skilled nursing facility bed to become available.  RADIOGRAPHS:  Left hip, preop, showed  a minimally displaced left femoral neck fracture.  Pelvis AP view showed a left femoral neck fracture with impaction of valgus and angulation.  Postoperative left hip showed the patient to be status post 3 nose pin within the left hip.  No complicating features.  The fracture fragments appeared to be in normal anatomical alignment.  Chest one view dated March 30, 2011, showed cardiomegaly with central vascular fullness linear left lung base opacity is likely atelectasis, scarring, early infiltrate, it could not be excluded.  Left elbow three views dated March 30, 2011, showed no evidence of acute injury to the  left elbow, no acute fracture.  EKG dated March 30, 2011, showed sinus rhythm with first-degree AV block at rate 74 beats per minute, PR interval 212 milliseconds, PRT axis 51/32/52.  DISCHARGE MEDICATIONS: 1. The is to be discharged on Lovenox for total of 3 weeks postop at     40 mg subcu daily. 2. Ferrous sulfate 325 mg 1 tablet b.i.d. for 1 month. 3. Vicodin 5/325 1-2 tablets p.o. q4-6 hours p.r.n. pain. 4. Methocarbamol 1 tablet by mouth q.6 hours p.r.n. pain. 5. Bisacodyl 10 mg one daily. 6. Bupropion XL 300 mg 1 tablet by mouth daily. 7. Col-Rite 100 mg 3 tablets by mouth daily. 8. Flomax 0.4 mg 1 capsule twice daily. 9. Hematinic plus vitamin 1 tablet daily. 10.Hydrochlorothiazide 12.5 mg 1 tablet daily. 11.Multivitamin therapeutics 1 tablet daily. 12.Omeprazole 20 mg 1 tablet daily. 13.PreserVision 2 tablets daily. 14.Red yeast rice 600 mg 2 tablets daily. 15.Systane one drop to both eyes four times daily as needed. 16.Temazepam 15 mg 1 tablet daily at bedtime. 17.Vitamin C 500 mg 2 tablets daily. 18.Vitamin D3 1000 units 2 tablets daily. 19.Vitamin E 100 international units one daily.  WEIGHTBEARING STATUS:  The patient is weightbearing as tolerated on the left leg.  FOLLOWUP:  The patient is to follow up with Dr. Lestine Box in 2 weeks from date of surgery for removal of staples.  DIET:  Regular diet.  WOUND CARE:  Daily dressing changes to left hip and keep wound clean and dry.  The patient may shower.  Discharge at this point in pending bed availability.     Richardean Canal, P.A.   ______________________________ Leonides Grills, M.D.    GC/MEDQ  D:  04/03/2011  T:  04/03/2011  Job:  [id number]  cc:   Leonides Grills, M.D. Fax: [phone]  Electronically Signed by Richardean Canal P.A. on 04/06/2011 09:47:06 AM Electronically Signed by Leonides Grills M.D. on 04/26/2011 04:39:48 PM

## 2011-05-09 ENCOUNTER — Observation Stay (HOSPITAL_COMMUNITY)
Admission: EM | Admit: 2011-05-09 | Discharge: 2011-05-10 | Disposition: A | Discharge status: Nursing Home (Includes ICF) | Payer: Medicare Other | Attending: Otolaryngology | Admitting: Otolaryngology

## 2011-05-09 ENCOUNTER — Emergency Department (HOSPITAL_COMMUNITY): Payer: Medicare Other

## 2011-05-09 ENCOUNTER — Encounter (HOSPITAL_COMMUNITY): Payer: Self-pay | Admitting: Radiology

## 2011-05-09 DIAGNOSIS — Z85828 Personal history of other malignant neoplasm of skin: Secondary | ICD-10-CM | POA: Insufficient documentation

## 2011-05-09 DIAGNOSIS — I10 Essential (primary) hypertension: Secondary | ICD-10-CM | POA: Insufficient documentation

## 2011-05-09 DIAGNOSIS — F329 Major depressive disorder, single episode, unspecified: Secondary | ICD-10-CM | POA: Insufficient documentation

## 2011-05-09 DIAGNOSIS — R042 Hemoptysis: Secondary | ICD-10-CM | POA: Insufficient documentation

## 2011-05-09 DIAGNOSIS — R041 Hemorrhage from throat: Principal | ICD-10-CM | POA: Insufficient documentation

## 2011-05-09 DIAGNOSIS — Z8546 Personal history of malignant neoplasm of prostate: Secondary | ICD-10-CM | POA: Insufficient documentation

## 2011-05-09 DIAGNOSIS — K219 Gastro-esophageal reflux disease without esophagitis: Secondary | ICD-10-CM | POA: Insufficient documentation

## 2011-05-09 DIAGNOSIS — F3289 Other specified depressive episodes: Secondary | ICD-10-CM | POA: Insufficient documentation

## 2011-05-09 LAB — POCT I-STAT, CHEM 8
BUN: 21 mg/dL (ref 6–23)
Calcium, Ion: 1.22 mmol/L (ref 1.12–1.32)
Chloride: 98 mEq/L (ref 96–112)
Sodium: 136 mEq/L (ref 135–145)

## 2011-05-09 LAB — DIFFERENTIAL
Basophils Absolute: 0.1 10*3/uL (ref 0.0–0.1)
Lymphocytes Relative: 10 % — ABNORMAL LOW (ref 12–46)
Lymphs Abs: 0.8 10*3/uL (ref 0.7–4.0)
Monocytes Absolute: 0.7 10*3/uL (ref 0.1–1.0)
Neutro Abs: 6.4 10*3/uL (ref 1.7–7.7)

## 2011-05-09 LAB — CBC
HCT: 35.7 % — ABNORMAL LOW (ref 39.0–52.0)
Hemoglobin: 11.7 g/dL — ABNORMAL LOW (ref 13.0–17.0)
MCHC: 32.8 g/dL (ref 30.0–36.0)
MCV: 93.5 fL (ref 78.0–100.0)

## 2011-05-09 LAB — PROTIME-INR: INR: 1 (ref 0.00–1.49)

## 2011-05-09 MED ORDER — IOHEXOL 300 MG/ML  SOLN
100.0000 mL | Freq: Once | INTRAMUSCULAR | Status: AC | PRN
  Administered 2011-05-09: 100 mL via INTRAVENOUS

## 2011-05-28 DIAGNOSIS — M79609 Pain in unspecified limb: Secondary | ICD-10-CM

## 2011-05-28 DIAGNOSIS — S72033A Displaced midcervical fracture of unspecified femur, initial encounter for closed fracture: Secondary | ICD-10-CM

## 2011-05-28 HISTORY — DX: Displaced midcervical fracture of unspecified femur, initial encounter for closed fracture: S72.033A

## 2011-05-28 HISTORY — DX: Pain in unspecified limb: M79.609

## 2011-06-26 DIAGNOSIS — C4441 Basal cell carcinoma of skin of scalp and neck: Secondary | ICD-10-CM

## 2011-06-26 HISTORY — DX: Basal cell carcinoma of skin of scalp and neck: C44.41

## 2011-07-10 DIAGNOSIS — N138 Other obstructive and reflux uropathy: Secondary | ICD-10-CM

## 2011-07-10 HISTORY — DX: Other obstructive and reflux uropathy: N13.8

## 2011-08-28 DIAGNOSIS — G609 Hereditary and idiopathic neuropathy, unspecified: Secondary | ICD-10-CM | POA: Insufficient documentation

## 2011-08-28 HISTORY — DX: Hereditary and idiopathic neuropathy, unspecified: G60.9

## 2011-11-27 DIAGNOSIS — R269 Unspecified abnormalities of gait and mobility: Secondary | ICD-10-CM

## 2011-11-27 HISTORY — DX: Unspecified abnormalities of gait and mobility: R26.9

## 2012-03-07 ENCOUNTER — Ambulatory Visit: Payer: Medicare Other

## 2012-03-07 ENCOUNTER — Ambulatory Visit (INDEPENDENT_AMBULATORY_CARE_PROVIDER_SITE_OTHER): Payer: Medicare Other | Admitting: Emergency Medicine

## 2012-03-07 VITALS — BP 134/70 | HR 88 | Temp 98.2°F | Resp 16 | Ht 69.0 in | Wt 170.0 lb

## 2012-03-07 DIAGNOSIS — R0689 Other abnormalities of breathing: Secondary | ICD-10-CM

## 2012-03-07 DIAGNOSIS — R011 Cardiac murmur, unspecified: Secondary | ICD-10-CM

## 2012-03-07 DIAGNOSIS — I350 Nonrheumatic aortic (valve) stenosis: Secondary | ICD-10-CM | POA: Insufficient documentation

## 2012-03-07 DIAGNOSIS — J189 Pneumonia, unspecified organism: Secondary | ICD-10-CM

## 2012-03-07 DIAGNOSIS — R0989 Other specified symptoms and signs involving the circulatory and respiratory systems: Secondary | ICD-10-CM

## 2012-03-07 HISTORY — DX: Nonrheumatic aortic (valve) stenosis: I35.0

## 2012-03-07 LAB — POCT CBC
Lymph, poc: 2.5 (ref 0.6–3.4)
MCH, POC: 28.7 pg (ref 27–31.2)
MCHC: 31.1 g/dL — AB (ref 31.8–35.4)
MID (cbc): 1.2 — AB (ref 0–0.9)
MPV: 7.9 fL (ref 0–99.8)
POC MID %: 5.8 %M (ref 0–12)
Platelet Count, POC: 533 10*3/uL — AB (ref 142–424)
WBC: 21.1 10*3/uL — AB (ref 4.6–10.2)

## 2012-03-07 MED ORDER — BENZONATATE 100 MG PO CAPS: 100.0000 mg | ORAL_CAPSULE | Freq: Three times a day (TID) | ORAL | Status: DC | PRN

## 2012-03-07 MED ORDER — CEFDINIR 300 MG PO CAPS: 300.0000 mg | ORAL_CAPSULE | Freq: Two times a day (BID) | ORAL | Status: AC

## 2012-03-07 MED ORDER — CEFTRIAXONE SODIUM 1 G IJ SOLR
1.0000 g | Freq: Once | INTRAMUSCULAR | Status: AC
  Administered 2012-03-07: 1 g via INTRAMUSCULAR

## 2012-03-07 NOTE — Progress Notes (Signed)
  Subjective:    Patient ID: Justin Deleon, male    DOB: 01/18/1926, 78 y.o.   MRN: [id number]  HPI Patient presents today with cold symptoms and he has felt lousy. He likes walking and exercises regularly. He was unable to do any working out this week. He lives at [facility] Dr. Chilton Si is his doctor He has been waking up at night with breathing troubles. He states that he feels something in his chest and it does not cause pain.  Review of Systems     Objective:   Physical Exam patient's neck is supple chest exam reveals diminished breath sounds in the bases with rhonchi present bilaterally. His cardiac exam reveals a 3/6 systolic murmur at the base of the heart which radiates up into the neck.  [facility] reading (PRIMARY) by  Dr.Indio Santilli patient has chronic granulomatous changes throughout both lung fields with scarring there may be a subtle infiltrate in the right base.  Results for orders placed in visit on 03/07/12  POCT CBC      Component Value Range   WBC 21.1 (*) 4.6 - 10.2 K/uL   Lymph, poc 2.5  0.6 - 3.4   POC LYMPH PERCENT 12.0  10 - 50 %L   MID (cbc) 1.2 (*) 0 - 0.9   POC MID % 5.8  0 - 12 %M   POC Granulocyte 17.3 (*) 2 - 6.9   Granulocyte percent 82.2 (*) 37 - 80 %G   RBC 3.90 (*) 4.69 - 6.13 M/uL   Hemoglobin 11.2 (*) 14.1 - 18.1 g/dL   HCT, POC 78.2 (*) 95.6 - 53.7 %   MCV 92.3  80 - 97 fL   MCH, POC 28.7  27 - 31.2 pg   MCHC 31.1 (*) 31.8 - 35.4 g/dL   RDW, POC 21.3     Platelet Count, POC 533 (*) 142 - 424 K/uL   MPV 7.9  0 - 99.8 fL        Assessment & Plan:  Patient has a very abnormal chest x-ray. He has an infiltrate in the right base. He has a white count 21,000. We'll treat with 1 g Rocephin, Omnicef 300 twice a day, recheck on Sunday.

## 2012-03-07 NOTE — Patient Instructions (Signed)

## 2012-03-07 NOTE — Progress Notes (Deleted)
  Subjective:    Patient ID: Justin Deleon, male    DOB: 1925/12/12, 78 y.o.   MRN: [id number]  HPI    Review of Systems     Objective:   Physical Exam        Assessment & Plan:

## 2012-03-09 ENCOUNTER — Ambulatory Visit (INDEPENDENT_AMBULATORY_CARE_PROVIDER_SITE_OTHER): Payer: Medicare Other | Admitting: Emergency Medicine

## 2012-03-09 VITALS — BP 126/64 | HR 77 | Temp 97.5°F | Resp 20 | Ht 69.0 in | Wt 169.4 lb

## 2012-03-09 DIAGNOSIS — R0902 Hypoxemia: Secondary | ICD-10-CM

## 2012-03-09 DIAGNOSIS — J189 Pneumonia, unspecified organism: Secondary | ICD-10-CM

## 2012-03-09 LAB — POCT CBC
Granulocyte percent: 72.3 %G (ref 37–80)
HCT, POC: 31.3 % — AB (ref 43.5–53.7)
Hemoglobin: 9.5 g/dL — AB (ref 14.1–18.1)
MCV: 91.8 fL (ref 80–97)
RBC: 3.41 M/uL — AB (ref 4.69–6.13)

## 2012-03-09 MED ORDER — CEFTRIAXONE SODIUM 1 G IJ SOLR
1.0000 g | Freq: Once | INTRAMUSCULAR | Status: AC
  Administered 2012-03-09: 1 g via INTRAMUSCULAR

## 2012-03-09 NOTE — Progress Notes (Signed)
  Subjective:    Patient ID: Justin Deleon, male    DOB: 1926-05-12, 78 y.o.   MRN: [id number]  HPI patient here for followup pneumonia. His chest x-ray yesterday showed increasing infiltrates in the bases his white count was elevated at greater than 20,000. He was given a shot of Rocephin and started on Omnicef and is here today for recheck. He overall feels better. He denies chills or fever. He has been E. well without difficulty.    Review of Systems     Objective:   Physical Exam physical exam reveals an elderly gentleman who is not in distress. His neck is supple. His chest exam reveals rales present in the bases his cardiac exam reveals a 3/6 systolic murmur at the base of the heart . Pulse ox check today was 96  Results for orders placed in visit on 03/09/12  POCT CBC      Component Value Range   WBC 13.9 (*) 4.6 - 10.2 K/uL   Lymph, poc 3.0  0.6 - 3.4   POC LYMPH PERCENT 21.4  10 - 50 %L   MID (cbc) 0.9  0 - 0.9   POC MID % 6.3  0 - 12 %M   POC Granulocyte 10.0 (*) 2 - 6.9   Granulocyte percent 72.3  37 - 80 %G   RBC 3.41 (*) 4.69 - 6.13 M/uL   Hemoglobin 9.5 (*) 14.1 - 18.1 g/dL   HCT, POC 40.9 (*) 81.1 - 53.7 %   MCV 91.8  80 - 97 fL   MCH, POC 27.9  27 - 31.2 pg   MCHC 30.4 (*) 31.8 - 35.4 g/dL   RDW, POC 91.4     Platelet Count, POC 456 (*) 142 - 424 K/uL   MPV 7.6  0 - 99.8 fL        Assessment & Plan:  WBC count is down. Will give 1 gram rocephin. Followup CXR in 7 to 10 days

## 2012-03-10 ENCOUNTER — Other Ambulatory Visit: Payer: Self-pay | Admitting: Emergency Medicine

## 2012-03-10 ENCOUNTER — Telehealth: Payer: Self-pay

## 2012-03-10 NOTE — Telephone Encounter (Signed)
Pt says medicare will not cover another refill of benzonatate (TESSALON) 100 MG capsule [[id number]]  And is wondering whether we can prescribe a substitute. He says rx helps him a lot and will run out tomorrow.  Best # to reach him [phone]  [id number] - [place], [place] - [address] AT [address] [facility] [address]

## 2012-03-11 MED ORDER — BENZONATATE 200 MG PO CAPS: 200.0000 mg | ORAL_CAPSULE | Freq: Three times a day (TID) | ORAL | Status: AC | PRN

## 2012-03-11 NOTE — Telephone Encounter (Signed)
Can we recommend something else?

## 2012-03-11 NOTE — Telephone Encounter (Signed)
Lets try a higher dose Of tessalon perles 200mg  and see if they will because it is a different dosage.  If not I think this is a $4 med at [facility] (the 100mg )

## 2012-03-11 NOTE — Telephone Encounter (Signed)
Called pharmacy and pt has already p/up his Rx and paid OOP

## 2012-03-19 ENCOUNTER — Ambulatory Visit: Payer: Medicare Other

## 2012-03-19 ENCOUNTER — Ambulatory Visit (INDEPENDENT_AMBULATORY_CARE_PROVIDER_SITE_OTHER): Payer: Medicare Other | Admitting: Emergency Medicine

## 2012-03-19 VITALS — BP 110/68 | HR 80 | Temp 97.5°F | Resp 16 | Ht 69.0 in | Wt 168.0 lb

## 2012-03-19 DIAGNOSIS — J189 Pneumonia, unspecified organism: Secondary | ICD-10-CM

## 2012-03-19 DIAGNOSIS — R0602 Shortness of breath: Secondary | ICD-10-CM

## 2012-03-19 LAB — POCT CBC
Hemoglobin: 11 g/dL — AB (ref 14.1–18.1)
Lymph, poc: 2.5 (ref 0.6–3.4)
MCH, POC: 27.5 pg (ref 27–31.2)
MCHC: 30.7 g/dL — AB (ref 31.8–35.4)
MID (cbc): 0.6 (ref 0–0.9)
MPV: 7.6 fL (ref 0–99.8)
POC Granulocyte: 6 (ref 2–6.9)
POC MID %: 6.6 %M (ref 0–12)
Platelet Count, POC: 632 10*3/uL — AB (ref 142–424)
RBC: 4 M/uL — AB (ref 4.69–6.13)
WBC: 9.1 10*3/uL (ref 4.6–10.2)

## 2012-03-19 NOTE — Progress Notes (Signed)
  Subjective:    Patient ID: Justin Deleon, male    DOB: 10/03/1925, 78 y.o.   MRN: [id number]  HPI patient enters for followup on pneumonia. He feels markedly better. He is no longer having any fever his appetite is better and is not nearly as short of breath.    Review of Systems     Objective:   Physical Exam chest is clear in the mid lungs mild decreased breath sounds in the bases. Chest exam reveals a 3/6 systolic murmur at the base of the heart which radiates up into the neck  Results for orders placed in visit on 03/19/12  POCT CBC      Component Value Range   WBC 9.1  4.6 - 10.2 K/uL   Lymph, poc 2.5  0.6 - 3.4   POC LYMPH PERCENT 27.1  10 - 50 %L   MID (cbc) 0.6  0 - 0.9   POC MID % 6.6  0 - 12 %M   POC Granulocyte 6.0  2 - 6.9   Granulocyte percent 66.3  37 - 80 %G   RBC 4.00 (*) 4.69 - 6.13 M/uL   Hemoglobin 11.0 (*) 14.1 - 18.1 g/dL   HCT, POC 40.9 (*) 81.1 - 53.7 %   MCV 89.4  80 - 97 fL   MCH, POC 27.5  27 - 31.2 pg   MCHC 30.7 (*) 31.8 - 35.4 g/dL   RDW, POC 91.4     Platelet Count, POC 632 (*) 142 - 424 K/uL   MPV 7.6  0 - 99.8 fL    [facility] reading (PRIMARY) by  Dr.  Cleta Alberts  clearing of previous pneumonia. There are chronic granulomatous changes present \     Assessment & Plan:  Check a CBC chest x-ray to followup on patient's pneumonia patient is clear his pneumonia and is doing well. No further treatment necessary to

## 2012-05-27 DIAGNOSIS — H02109 Unspecified ectropion of unspecified eye, unspecified eyelid: Secondary | ICD-10-CM

## 2012-05-27 HISTORY — DX: Unspecified ectropion of unspecified eye, unspecified eyelid: H02.109

## 2012-08-26 DIAGNOSIS — R609 Edema, unspecified: Secondary | ICD-10-CM | POA: Insufficient documentation

## 2012-08-26 HISTORY — DX: Edema, unspecified: R60.9

## 2012-11-25 ENCOUNTER — Other Ambulatory Visit: Payer: Self-pay | Admitting: Internal Medicine

## 2012-11-25 ENCOUNTER — Other Ambulatory Visit: Payer: Self-pay

## 2012-11-25 DIAGNOSIS — G609 Hereditary and idiopathic neuropathy, unspecified: Secondary | ICD-10-CM

## 2012-11-25 MED ORDER — GABAPENTIN 300 MG PO CAPS: ORAL_CAPSULE | ORAL | Status: DC

## 2012-12-08 ENCOUNTER — Encounter: Payer: Self-pay | Admitting: *Deleted

## 2012-12-08 DIAGNOSIS — G609 Hereditary and idiopathic neuropathy, unspecified: Secondary | ICD-10-CM

## 2012-12-08 DIAGNOSIS — K2100 Gastro-esophageal reflux disease with esophagitis, without bleeding: Secondary | ICD-10-CM

## 2012-12-08 DIAGNOSIS — N401 Enlarged prostate with lower urinary tract symptoms: Secondary | ICD-10-CM

## 2012-12-08 DIAGNOSIS — I739 Peripheral vascular disease, unspecified: Secondary | ICD-10-CM

## 2012-12-08 DIAGNOSIS — D649 Anemia, unspecified: Secondary | ICD-10-CM

## 2012-12-08 DIAGNOSIS — C4441 Basal cell carcinoma of skin of scalp and neck: Secondary | ICD-10-CM

## 2012-12-08 DIAGNOSIS — C61 Malignant neoplasm of prostate: Secondary | ICD-10-CM

## 2012-12-08 DIAGNOSIS — R269 Unspecified abnormalities of gait and mobility: Secondary | ICD-10-CM

## 2012-12-08 DIAGNOSIS — I10 Essential (primary) hypertension: Secondary | ICD-10-CM

## 2012-12-08 DIAGNOSIS — R609 Edema, unspecified: Secondary | ICD-10-CM

## 2012-12-09 ENCOUNTER — Non-Acute Institutional Stay: Payer: Medicare Other | Admitting: Internal Medicine

## 2012-12-09 ENCOUNTER — Encounter: Payer: Self-pay | Admitting: Internal Medicine

## 2012-12-09 VITALS — BP 132/72 | HR 68 | Ht 69.0 in | Wt 177.0 lb

## 2012-12-09 DIAGNOSIS — M79609 Pain in unspecified limb: Secondary | ICD-10-CM

## 2012-12-09 DIAGNOSIS — R609 Edema, unspecified: Secondary | ICD-10-CM

## 2012-12-09 DIAGNOSIS — I10 Essential (primary) hypertension: Secondary | ICD-10-CM

## 2012-12-09 DIAGNOSIS — E785 Hyperlipidemia, unspecified: Secondary | ICD-10-CM

## 2012-12-09 DIAGNOSIS — G609 Hereditary and idiopathic neuropathy, unspecified: Secondary | ICD-10-CM

## 2012-12-09 DIAGNOSIS — D649 Anemia, unspecified: Secondary | ICD-10-CM

## 2012-12-09 DIAGNOSIS — N401 Enlarged prostate with lower urinary tract symptoms: Secondary | ICD-10-CM

## 2012-12-09 DIAGNOSIS — I739 Peripheral vascular disease, unspecified: Secondary | ICD-10-CM

## 2012-12-09 MED ORDER — GABAPENTIN 300 MG PO CAPS: ORAL_CAPSULE | ORAL | Status: DC

## 2012-12-09 NOTE — Progress Notes (Signed)
Subjective:    Patient ID: Justin Deleon, male    DOB: 04-29-1926, 79 y.o.   MRN: [id number]  HPI The patient had a removal of a skin cancer on the back by Dr. Margo Aye recently. This is healed.  He is using Coricidin several times per week for upper respiratory symptoms including congestion.  Unspecified hereditary and idiopathic peripheral neuropathy: Patient has chronic pain in his feet. Although he has known peripheral vascular disease, peripheral neuropathy contributes to this problem as well. He has seen some improvement with the use of gabapentin.  Prostate cancer: No evidence for relapse. Followed by Dr. Patsi Sears.  Anemia, unspecified: Improved. He is still taking ferrous sulfate.  Hypertension: Controlled  Peripheral vascular disease, unspecified: No peripheral vascular disease. He has been seen by a vascular surgeon. They do not recommend surgery.  Pain in limb: Continues with pains in his legs  Edema: Improved. He continues to have chronic edema of about 1-2+, but this is better than in the past.  Hypertrophy of prostate with urinary obstruction and other lower urinary tract symptoms (LUTS): Patient has nocturia, hesitation, and urgency.     Review of Systems  Constitutional: Positive for fatigue. Negative for fever, chills, diaphoresis, activity change, appetite change and unexpected weight change.  HENT: Positive for hearing loss and rhinorrhea.   Eyes: Negative.   Respiratory: Negative for apnea, cough, choking, chest tightness and shortness of breath.   Cardiovascular: Negative for chest pain, palpitations and leg swelling.  Gastrointestinal: Negative.   Endocrine: Negative.   Genitourinary: Negative.   Musculoskeletal: Positive for back pain, arthralgias and gait problem.  Skin: Negative for color change, pallor, rash and wound.  Neurological: Negative.   Hematological: Bruises/bleeds easily.  Psychiatric/Behavioral: Positive for confusion.        Objective:   Physical Exam  Constitutional: He is oriented to person, place, and time. No distress.  Frail elderly male  HENT:  Nose: Nose normal.  Mouth/Throat: No oropharyngeal exudate.  Bilateral loss of hearing  Eyes:  Corrective lenses  Neck: Normal range of motion. Neck supple. No JVD present. No tracheal deviation present. No thyromegaly present.  Cardiovascular: Normal rate and regular rhythm.  Exam reveals no gallop and no friction rub.   Murmur heard. Grade 3/6 cooing ejection murmur  Abdominal: Soft. Bowel sounds are normal. He exhibits no distension and no mass. There is no tenderness.  Musculoskeletal: Normal range of motion. He exhibits edema. He exhibits no tenderness.  Lymphadenopathy:    He has no cervical adenopathy.  Neurological: He is alert and oriented to person, place, and time. No cranial nerve deficit. Coordination normal.  Skin:  Actinic keratosis. Bruises.  Psychiatric: He has a normal mood and affect. His behavior is normal. Thought content normal.  Some loss of memory. Poor historian.     Lab reports 11/27/12 CBC: Hemoglobin 12.5, MCV 96.6  BMP normal     Assessment & Plan:  1. Unspecified hereditary and idiopathic peripheral neuropathy Continue current medications - gabapentin (NEURONTIN) 300 MG capsule; Use up to 5 capsules at bed to help pains in feet  Dispense: 150 capsule; Refill: 3  2. Anemia, unspecified Improved. Discontinue ferrous sulfate - CBC with Differential; Future  3. Hypertension Stopping hydrochlorothiazide - Basic metabolic panel; Future  4. Peripheral vascular disease, unspecified Unchanged  5. Pain in limb Combination of problems with peripheral vascular disease and peripheral neuropathy  6. Edema Improved.  7. Hypertrophy of prostate with urinary obstruction and other lower urinary tract symptoms (LUTS)  Hydrochlorothiazide may be aggravating symptoms. Will discontinue.  8. Other and unspecified  hyperlipidemia Stable - Lipid panel; Future

## 2012-12-11 ENCOUNTER — Encounter: Payer: Self-pay | Admitting: Internal Medicine

## 2012-12-11 DIAGNOSIS — C61 Malignant neoplasm of prostate: Secondary | ICD-10-CM | POA: Insufficient documentation

## 2012-12-11 DIAGNOSIS — I959 Hypotension, unspecified: Secondary | ICD-10-CM | POA: Insufficient documentation

## 2012-12-11 DIAGNOSIS — N400 Enlarged prostate without lower urinary tract symptoms: Secondary | ICD-10-CM | POA: Insufficient documentation

## 2012-12-24 ENCOUNTER — Encounter: Payer: Self-pay | Admitting: Internal Medicine

## 2012-12-29 ENCOUNTER — Other Ambulatory Visit: Payer: Self-pay | Admitting: Internal Medicine

## 2012-12-30 ENCOUNTER — Encounter: Payer: Self-pay | Admitting: *Deleted

## 2012-12-30 ENCOUNTER — Other Ambulatory Visit: Payer: Self-pay | Admitting: *Deleted

## 2012-12-30 MED ORDER — TAMSULOSIN HCL 0.4 MG PO CAPS: ORAL_CAPSULE | ORAL | Status: DC

## 2013-01-28 ENCOUNTER — Other Ambulatory Visit: Payer: Self-pay | Admitting: Nurse Practitioner

## 2013-02-02 ENCOUNTER — Other Ambulatory Visit: Payer: Self-pay | Admitting: Geriatric Medicine

## 2013-02-02 MED ORDER — ZOLPIDEM TARTRATE 10 MG SL SUBL: SUBLINGUAL_TABLET | SUBLINGUAL | Status: DC

## 2013-03-31 ENCOUNTER — Telehealth: Payer: Self-pay

## 2013-03-31 ENCOUNTER — Other Ambulatory Visit: Payer: Self-pay

## 2013-03-31 MED ORDER — ZOLPIDEM TARTRATE 10 MG SL SUBL: SUBLINGUAL_TABLET | SUBLINGUAL | Status: DC

## 2013-03-31 NOTE — Telephone Encounter (Signed)
Patient called triage line indicating he has already contacted pharmacy for refill request on Ambien (Zolpidem), pharmacy states they have sent request to our office. I informed patient our system does not indicated a refill request received from pharmacy, patient aware I will call in rx now. Patient to further follow-up with the pharmacy.  Last OV 12/2012, pending OV 04/2013

## 2013-03-31 NOTE — Telephone Encounter (Signed)
Fax received from pharmacy indicating Zolpidem RX requires Prior Authorization. I called [phone] and requested PA form, I was informed once form is completed by MD and faxed back it takes 24-72 hours for the insurance company to decide if they will cover medication based on provided information. Form was recieved, placed on ledge for review for PCP.   Patient was informed of PA process and that it can take up to 72 hours for reply. Patient states he received #15 pills from the pharmacy to hold him over until process complete. Patient stressed that his son is an attorney and he will contact him if this medication is not covered.

## 2013-04-14 ENCOUNTER — Non-Acute Institutional Stay: Payer: Medicare Other | Admitting: Internal Medicine

## 2013-04-14 ENCOUNTER — Encounter: Payer: Self-pay | Admitting: Internal Medicine

## 2013-04-14 VITALS — BP 118/62 | HR 62 | Ht 69.0 in | Wt 180.0 lb

## 2013-04-14 DIAGNOSIS — G609 Hereditary and idiopathic neuropathy, unspecified: Secondary | ICD-10-CM

## 2013-04-14 DIAGNOSIS — C61 Malignant neoplasm of prostate: Secondary | ICD-10-CM

## 2013-04-14 DIAGNOSIS — R609 Edema, unspecified: Secondary | ICD-10-CM

## 2013-04-14 DIAGNOSIS — N138 Other obstructive and reflux uropathy: Secondary | ICD-10-CM

## 2013-04-14 DIAGNOSIS — N401 Enlarged prostate with lower urinary tract symptoms: Secondary | ICD-10-CM

## 2013-04-14 DIAGNOSIS — Z8719 Personal history of other diseases of the digestive system: Secondary | ICD-10-CM | POA: Insufficient documentation

## 2013-04-14 DIAGNOSIS — D649 Anemia, unspecified: Secondary | ICD-10-CM

## 2013-04-14 DIAGNOSIS — I10 Essential (primary) hypertension: Secondary | ICD-10-CM

## 2013-04-14 DIAGNOSIS — K922 Gastrointestinal hemorrhage, unspecified: Secondary | ICD-10-CM

## 2013-04-14 NOTE — Progress Notes (Signed)
Failed clock drawing  

## 2013-04-14 NOTE — Patient Instructions (Addendum)
Continue current medications. 

## 2013-04-14 NOTE — Progress Notes (Signed)
Subjective:    Patient ID: Justin Deleon, male    DOB: 17-Aug-1925, 79 y.o.   MRN: [id number]   Chief Complaint  Patient presents with  . Medical Managment of Chronic Issues    blood pressure, peripheral neuropathy, edema, cholesterol, memory     HPI  Hurts in both shoulders when he walks for the last couple of weeks. He thinks it is related to pushing down on his 4 wheel walker. Denies use of OTC pain medications.  Hypertension: controlled  Unspecified hereditary and idiopathic peripheral neuropathy  Hypertrophy of prostate with urinary obstruction and other lower urinary tract symptoms (LUTS)  Anemia, unspecified: worse. Hgb dropped from 12.5 to 8.4.. Denies seeing blood in the stool.  Edema: improved    Current Outpatient Prescriptions on File Prior to Visit  Medication Sig Dispense Refill  . beta carotene w/minerals (OCUVITE) tablet Take 1 tablet by mouth daily.       Marland Kitchen buPROPion (WELLBUTRIN XL) 300 MG 24 hr tablet TAKE 1 TABLET BY MOUTH DAILY  90 tablet  0  . Cholecalciferol (VITAMIN D) 2000 UNITS tablet Take 2,000 Units by mouth daily.        Marland Kitchen docusate sodium (COLACE) 100 MG capsule Take 300 mg by mouth daily. Take three capsules to daily to soften stool.      . ferrous sulfate 325 (65 FE) MG tablet Take 325 mg by mouth daily with breakfast. Take 1 daily to treat anemia.      Marland Kitchen gabapentin (NEURONTIN) 300 MG capsule Use up to 5 capsules at bed to help pains in feet  150 capsule  3  . HYDROcodone-acetaminophen (NORCO/VICODIN) 5-325 MG per tablet Take 1 tablet by mouth every 4 (four) hours as needed for pain. Take 1 tablet every 4 hours as needed for moderate pain. Take 2 tablets every 4 hours as needed for severe pain.      . methocarbamol (ROBAXIN) 500 MG tablet Take 500 mg by mouth 4 (four) times daily. Take 1 tablet every 6 hours as needed for spasms.      . Multiple Vitamins-Minerals (MULTIVITAMIN WITH MINERALS) tablet Take 1 tablet by mouth as directed.       Marland Kitchen  omeprazole (PRILOSEC) 20 MG capsule Take 20 mg by mouth daily. Take one daily to reduce stomach acid and to help heartburn.      Bertram Gala Glycol-Propyl Glycol (SYSTANE) 0.4-0.3 % SOLN Apply to eye. One drop 4 times daily as needed for dry eyes      . polyethylene glycol powder (GLYCOLAX/MIRALAX) powder Take 17 g by mouth daily. Mix 17 grams with 8 oz. of water daily for laxative.      . Red Yeast Rice 600 MG TABS Take 1 tablet by mouth 2 (two) times daily. Take two tablets daily to lower cholesterol.      . senna-docusate (SENNA PLUS) 8.6-50 MG per tablet Take 2 tablets by mouth at bedtime. Take 2 tablets every night at bedtime for constipation.      . tamsulosin (FLOMAX) 0.4 MG CAPS Take one capsule by mouth once daily  30 capsule  6  . temazepam (RESTORIL) 15 MG capsule Take 45 mg by mouth daily as needed. For sleep       . traMADol (ULTRAM) 50 MG tablet Take 100 mg by mouth daily.        . vitamin C (ASCORBIC ACID) 500 MG tablet Take 500 mg by mouth daily. Take 1 tablet twice daily for vitamin C supplement.      Marland Kitchen  vitamin E (VITAMIN E) 1000 UNIT capsule Take 1,000 Units by mouth daily. Take 1 capsule daily for vitamin E supplement.      . Zolpidem Tartrate 10 MG SUBL Take one tablet by mouth at bedtime as needed for rest.  30 tablet  0   No current facility-administered medications on file prior to visit.    Review of Systems  Constitutional: Positive for fatigue. Negative for fever, chills, diaphoresis, activity change, appetite change and unexpected weight change.  HENT: Positive for hearing loss and rhinorrhea.   Eyes: Negative.   Respiratory: Negative for apnea, cough, choking, chest tightness and shortness of breath.   Cardiovascular: Negative for chest pain, palpitations and leg swelling.  Gastrointestinal: Negative.   Endocrine: Negative.   Genitourinary: Negative.   Musculoskeletal: Positive for back pain, arthralgias and gait problem.  Skin: Negative for color change, pallor,  rash and wound.  Neurological: Negative.   Hematological: Bruises/bleeds easily.  Psychiatric/Behavioral: Positive for confusion.       Objective:BP 118/62  Pulse 62  Ht 5\' 9"  (1.753 m)  Wt 180 lb (81.647 kg)  BMI 26.57 kg/m2    Physical Exam  Constitutional: He is oriented to person, place, and time. No distress.  Frail elderly male  HENT:  Nose: Nose normal.  Mouth/Throat: No oropharyngeal exudate.  Bilateral loss of hearing  Eyes:  Corrective lenses  Neck: Normal range of motion. Neck supple. No JVD present. No tracheal deviation present. No thyromegaly present.  Cardiovascular: Normal rate and regular rhythm.  Exam reveals no gallop and no friction rub.   Murmur heard. Grade 3/6 cooing ejection murmur  Abdominal: Soft. Bowel sounds are normal. He exhibits no distension and no mass. There is no tenderness.  Genitourinary: Guaiac positive stool.  Prostate is absent. There is a 5mm firm, but not hard, nodule in the left lower side. Stool is a maroon/ brown and heme positive.  Musculoskeletal: Normal range of motion. He exhibits edema. He exhibits no tenderness.  Lymphadenopathy:    He has no cervical adenopathy.  Neurological: He is alert and oriented to person, place, and time. No cranial nerve deficit. Coordination normal.  04/14/13 MMSE: 29/30. Failed clock drawing.  Skin:  Actinic keratosis. Bruises.  Psychiatric: He has a normal mood and affect. His behavior is normal. Thought content normal.  Some loss of memory. Poor historian.     LAB REVIEW 11/27/12 Hgb 12.5 04/06/13 CBC: Hgb 8.4, MCV 83.5  BMP; creat 1.38, BUN 19     Assessment & Plan:  GI bleed - Plan: Ambulatory referral to Gastroenterology. Had colonoscopy from Dr. Danise Edge in 2009.  Hypertension: stAble  Unspecified hereditary and idiopathic peripheral neuropathy: unchanged  Hypertrophy of prostate with urinary obstruction and other lower urinary tract symptoms (LUTS): unchanged  Anemia,  unspecified: worse  Edema: resolved  Prostate cancer: repeat PSA due to nodule

## 2013-04-16 ENCOUNTER — Telehealth: Payer: Self-pay | Admitting: *Deleted

## 2013-04-16 ENCOUNTER — Inpatient Hospital Stay (HOSPITAL_COMMUNITY)
Admission: EM | Admit: 2013-04-16 | Discharge: 2013-04-22 | DRG: 377 | Disposition: A | Discharge status: Skilled Nursing Facility | Payer: Medicare Other | Attending: Internal Medicine | Admitting: Internal Medicine

## 2013-04-16 ENCOUNTER — Encounter (HOSPITAL_COMMUNITY): Payer: Self-pay | Admitting: Emergency Medicine

## 2013-04-16 ENCOUNTER — Inpatient Hospital Stay (HOSPITAL_COMMUNITY): Payer: Medicare Other

## 2013-04-16 DIAGNOSIS — K219 Gastro-esophageal reflux disease without esophagitis: Secondary | ICD-10-CM | POA: Diagnosis present

## 2013-04-16 DIAGNOSIS — N401 Enlarged prostate with lower urinary tract symptoms: Secondary | ICD-10-CM | POA: Diagnosis present

## 2013-04-16 DIAGNOSIS — I739 Peripheral vascular disease, unspecified: Secondary | ICD-10-CM | POA: Diagnosis present

## 2013-04-16 DIAGNOSIS — Z66 Do not resuscitate: Secondary | ICD-10-CM | POA: Diagnosis present

## 2013-04-16 DIAGNOSIS — N138 Other obstructive and reflux uropathy: Secondary | ICD-10-CM | POA: Diagnosis present

## 2013-04-16 DIAGNOSIS — R609 Edema, unspecified: Secondary | ICD-10-CM

## 2013-04-16 DIAGNOSIS — Z8546 Personal history of malignant neoplasm of prostate: Secondary | ICD-10-CM

## 2013-04-16 DIAGNOSIS — I10 Essential (primary) hypertension: Secondary | ICD-10-CM

## 2013-04-16 DIAGNOSIS — D62 Acute posthemorrhagic anemia: Secondary | ICD-10-CM | POA: Diagnosis present

## 2013-04-16 DIAGNOSIS — E876 Hypokalemia: Secondary | ICD-10-CM

## 2013-04-16 DIAGNOSIS — F329 Major depressive disorder, single episode, unspecified: Secondary | ICD-10-CM | POA: Diagnosis present

## 2013-04-16 DIAGNOSIS — K922 Gastrointestinal hemorrhage, unspecified: Principal | ICD-10-CM

## 2013-04-16 DIAGNOSIS — I700 Atherosclerosis of aorta: Secondary | ICD-10-CM | POA: Diagnosis present

## 2013-04-16 DIAGNOSIS — C61 Malignant neoplasm of prostate: Secondary | ICD-10-CM | POA: Diagnosis present

## 2013-04-16 DIAGNOSIS — R7989 Other specified abnormal findings of blood chemistry: Secondary | ICD-10-CM | POA: Diagnosis present

## 2013-04-16 DIAGNOSIS — K648 Other hemorrhoids: Secondary | ICD-10-CM | POA: Diagnosis present

## 2013-04-16 DIAGNOSIS — D500 Iron deficiency anemia secondary to blood loss (chronic): Secondary | ICD-10-CM | POA: Diagnosis present

## 2013-04-16 DIAGNOSIS — I248 Other forms of acute ischemic heart disease: Secondary | ICD-10-CM | POA: Diagnosis present

## 2013-04-16 DIAGNOSIS — G608 Other hereditary and idiopathic neuropathies: Secondary | ICD-10-CM | POA: Diagnosis present

## 2013-04-16 DIAGNOSIS — G609 Hereditary and idiopathic neuropathy, unspecified: Secondary | ICD-10-CM

## 2013-04-16 DIAGNOSIS — I350 Nonrheumatic aortic (valve) stenosis: Secondary | ICD-10-CM | POA: Diagnosis present

## 2013-04-16 DIAGNOSIS — I359 Nonrheumatic aortic valve disorder, unspecified: Secondary | ICD-10-CM

## 2013-04-16 DIAGNOSIS — K644 Residual hemorrhoidal skin tags: Secondary | ICD-10-CM | POA: Diagnosis present

## 2013-04-16 DIAGNOSIS — F3289 Other specified depressive episodes: Secondary | ICD-10-CM | POA: Diagnosis present

## 2013-04-16 DIAGNOSIS — I2489 Other forms of acute ischemic heart disease: Secondary | ICD-10-CM | POA: Diagnosis present

## 2013-04-16 DIAGNOSIS — K921 Melena: Secondary | ICD-10-CM | POA: Diagnosis present

## 2013-04-16 DIAGNOSIS — Z85828 Personal history of other malignant neoplasm of skin: Secondary | ICD-10-CM

## 2013-04-16 DIAGNOSIS — Z87891 Personal history of nicotine dependence: Secondary | ICD-10-CM

## 2013-04-16 DIAGNOSIS — I959 Hypotension, unspecified: Secondary | ICD-10-CM | POA: Diagnosis present

## 2013-04-16 DIAGNOSIS — I129 Hypertensive chronic kidney disease with stage 1 through stage 4 chronic kidney disease, or unspecified chronic kidney disease: Secondary | ICD-10-CM | POA: Diagnosis present

## 2013-04-16 DIAGNOSIS — N183 Chronic kidney disease, stage 3 unspecified: Secondary | ICD-10-CM | POA: Diagnosis present

## 2013-04-16 DIAGNOSIS — R531 Weakness: Secondary | ICD-10-CM | POA: Diagnosis present

## 2013-04-16 DIAGNOSIS — D649 Anemia, unspecified: Secondary | ICD-10-CM | POA: Diagnosis present

## 2013-04-16 DIAGNOSIS — N289 Disorder of kidney and ureter, unspecified: Secondary | ICD-10-CM | POA: Diagnosis present

## 2013-04-16 DIAGNOSIS — F0390 Unspecified dementia without behavioral disturbance: Secondary | ICD-10-CM | POA: Diagnosis present

## 2013-04-16 DIAGNOSIS — I5031 Acute diastolic (congestive) heart failure: Secondary | ICD-10-CM

## 2013-04-16 DIAGNOSIS — N400 Enlarged prostate without lower urinary tract symptoms: Secondary | ICD-10-CM | POA: Diagnosis present

## 2013-04-16 DIAGNOSIS — Z79899 Other long term (current) drug therapy: Secondary | ICD-10-CM

## 2013-04-16 DIAGNOSIS — M79609 Pain in unspecified limb: Secondary | ICD-10-CM

## 2013-04-16 DIAGNOSIS — Z8719 Personal history of other diseases of the digestive system: Secondary | ICD-10-CM | POA: Diagnosis present

## 2013-04-16 DIAGNOSIS — E785 Hyperlipidemia, unspecified: Secondary | ICD-10-CM | POA: Diagnosis present

## 2013-04-16 DIAGNOSIS — N179 Acute kidney failure, unspecified: Secondary | ICD-10-CM | POA: Diagnosis present

## 2013-04-16 LAB — URINALYSIS, ROUTINE W REFLEX MICROSCOPIC
Bilirubin Urine: NEGATIVE
Glucose, UA: NEGATIVE mg/dL
Hgb urine dipstick: NEGATIVE
Leukocytes, UA: NEGATIVE
Specific Gravity, Urine: 1.014 (ref 1.005–1.030)
Urobilinogen, UA: 0.2 mg/dL (ref 0.0–1.0)
pH: 5.5 (ref 5.0–8.0)

## 2013-04-16 LAB — CBC WITH DIFFERENTIAL/PLATELET
Basophils Absolute: 0 10*3/uL (ref 0.0–0.1)
Basophils Relative: 0 % (ref 0–1)
Eosinophils Absolute: 0 10*3/uL (ref 0.0–0.7)
Eosinophils Relative: 0 % (ref 0–5)
Hemoglobin: 6.1 g/dL — CL (ref 13.0–17.0)
Lymphs Abs: 1.1 10*3/uL (ref 0.7–4.0)
MCH: 25 pg — ABNORMAL LOW (ref 26.0–34.0)
MCHC: 31.4 g/dL (ref 30.0–36.0)
MCV: 79.5 fL (ref 78.0–100.0)
Monocytes Absolute: 0.8 10*3/uL (ref 0.1–1.0)
Monocytes Relative: 9 % (ref 3–12)
Neutrophils Relative %: 78 % — ABNORMAL HIGH (ref 43–77)
Platelets: 427 10*3/uL — ABNORMAL HIGH (ref 150–400)
RBC: 2.44 MIL/uL — ABNORMAL LOW (ref 4.22–5.81)

## 2013-04-16 LAB — COMPREHENSIVE METABOLIC PANEL
AST: 25 U/L (ref 0–37)
Albumin: 3.6 g/dL (ref 3.5–5.2)
Chloride: 101 mEq/L (ref 96–112)
Creatinine, Ser: 1.36 mg/dL — ABNORMAL HIGH (ref 0.50–1.35)
Potassium: 4.5 mEq/L (ref 3.5–5.1)
Total Bilirubin: 0.3 mg/dL (ref 0.3–1.2)

## 2013-04-16 LAB — MRSA PCR SCREENING: MRSA by PCR: NEGATIVE

## 2013-04-16 LAB — TROPONIN I: Troponin I: 0.89 ng/mL (ref <0.30)

## 2013-04-16 LAB — POCT I-STAT TROPONIN I

## 2013-04-16 LAB — PROTIME-INR: INR: 1.01 (ref 0.00–1.49)

## 2013-04-16 LAB — PREPARE RBC (CROSSMATCH)

## 2013-04-16 MED ORDER — SENNOSIDES-DOCUSATE SODIUM 8.6-50 MG PO TABS
3.0000 | ORAL_TABLET | Freq: Every day | ORAL | Status: DC
  Administered 2013-04-18 – 2013-04-22 (×5): 3 via ORAL
  Filled 2013-04-16 (×6): qty 3

## 2013-04-16 MED ORDER — SODIUM CHLORIDE 0.9 % IV BOLUS (SEPSIS)
500.0000 mL | Freq: Once | INTRAVENOUS | Status: AC
  Administered 2013-04-16: 500 mL via INTRAVENOUS

## 2013-04-16 MED ORDER — POLYETHYLENE GLYCOL 3350 17 GM/SCOOP PO POWD: 17.0000 g | Freq: Every day | ORAL | Status: DC

## 2013-04-16 MED ORDER — SODIUM CHLORIDE 0.9 % IV SOLN
INTRAVENOUS | Status: AC
  Administered 2013-04-16: 23:00:00 via INTRAVENOUS

## 2013-04-16 MED ORDER — POLYETHYLENE GLYCOL 3350 17 G PO PACK
17.0000 g | PACK | Freq: Every day | ORAL | Status: DC
  Administered 2013-04-18 – 2013-04-22 (×5): 17 g via ORAL
  Filled 2013-04-16 (×6): qty 1

## 2013-04-16 MED ORDER — ACETAMINOPHEN 325 MG PO TABS
650.0000 mg | ORAL_TABLET | Freq: Four times a day (QID) | ORAL | Status: DC | PRN
  Administered 2013-04-17: 650 mg via ORAL
  Filled 2013-04-16: qty 2

## 2013-04-16 MED ORDER — ONDANSETRON HCL 4 MG PO TABS: 4.0000 mg | ORAL_TABLET | Freq: Four times a day (QID) | ORAL | Status: DC | PRN

## 2013-04-16 MED ORDER — SODIUM CHLORIDE 0.9 % IJ SOLN
3.0000 mL | Freq: Two times a day (BID) | INTRAMUSCULAR | Status: DC
  Administered 2013-04-16 – 2013-04-22 (×9): 3 mL via INTRAVENOUS

## 2013-04-16 MED ORDER — MULTI-VITAMIN/MINERALS PO TABS: 1.0000 | ORAL_TABLET | ORAL | Status: DC

## 2013-04-16 MED ORDER — SIMETHICONE 40 MG/0.6ML PO SUSP (UNIT DOSE)
40.0000 mg | Freq: Once | ORAL | Status: AC
  Administered 2013-04-16: 40 mg via ORAL
  Filled 2013-04-16: qty 0.6

## 2013-04-16 MED ORDER — VITAMIN E 45 MG (100 UNIT) PO CAPS
1000.0000 [IU] | ORAL_CAPSULE | Freq: Every day | ORAL | Status: DC
  Administered 2013-04-17 – 2013-04-22 (×6): 1000 [IU] via ORAL
  Filled 2013-04-16 (×6): qty 2

## 2013-04-16 MED ORDER — VITAMIN C 500 MG PO TABS: 1000.0000 mg | ORAL_TABLET | Freq: Two times a day (BID) | ORAL | Status: DC

## 2013-04-16 MED ORDER — OCUVITE-LUTEIN PO CAPS
2.0000 | ORAL_CAPSULE | Freq: Every day | ORAL | Status: DC
  Administered 2013-04-18 – 2013-04-22 (×5): 2 via ORAL
  Filled 2013-04-16 (×6): qty 2

## 2013-04-16 MED ORDER — VITAMIN D3 25 MCG (1000 UNIT) PO TABS
3000.0000 [IU] | ORAL_TABLET | Freq: Every day | ORAL | Status: DC
  Administered 2013-04-17 – 2013-04-22 (×6): 3000 [IU] via ORAL
  Filled 2013-04-16 (×6): qty 3

## 2013-04-16 MED ORDER — POLYVINYL ALCOHOL 1.4 % OP SOLN
1.0000 [drp] | Freq: Four times a day (QID) | OPHTHALMIC | Status: DC | PRN
  Administered 2013-04-20: 1 [drp] via OPHTHALMIC
  Filled 2013-04-16: qty 15

## 2013-04-16 MED ORDER — BUPROPION HCL ER (XL) 300 MG PO TB24
300.0000 mg | ORAL_TABLET | ORAL | Status: DC
  Administered 2013-04-17 – 2013-04-22 (×6): 300 mg via ORAL
  Filled 2013-04-16 (×8): qty 1

## 2013-04-16 MED ORDER — ACETAMINOPHEN 650 MG RE SUPP: 650.0000 mg | Freq: Four times a day (QID) | RECTAL | Status: DC | PRN

## 2013-04-16 MED ORDER — DOCUSATE SODIUM 100 MG PO CAPS
300.0000 mg | ORAL_CAPSULE | Freq: Every day | ORAL | Status: DC
  Administered 2013-04-18 – 2013-04-22 (×5): 300 mg via ORAL
  Filled 2013-04-16 (×6): qty 3

## 2013-04-16 MED ORDER — SODIUM CHLORIDE 0.9 % IV SOLN
8.0000 mg/h | INTRAVENOUS | Status: DC
  Administered 2013-04-16: 8 mg/h via INTRAVENOUS
  Filled 2013-04-16 (×3): qty 80

## 2013-04-16 MED ORDER — ZOLPIDEM TARTRATE 10 MG SL SUBL: 10.0000 mg | SUBLINGUAL_TABLET | Freq: Every evening | SUBLINGUAL | Status: DC | PRN

## 2013-04-16 MED ORDER — SODIUM CHLORIDE 0.9 % IV SOLN
80.0000 mg | Freq: Once | INTRAVENOUS | Status: AC
  Administered 2013-04-16: 80 mg via INTRAVENOUS
  Filled 2013-04-16: qty 80

## 2013-04-16 MED ORDER — PANTOPRAZOLE SODIUM 40 MG IV SOLR: 40.0000 mg | Freq: Two times a day (BID) | INTRAVENOUS | Status: DC

## 2013-04-16 MED ORDER — TAMSULOSIN HCL 0.4 MG PO CAPS
0.4000 mg | ORAL_CAPSULE | Freq: Every day | ORAL | Status: DC
  Administered 2013-04-17 – 2013-04-22 (×5): 0.4 mg via ORAL
  Filled 2013-04-16 (×7): qty 1

## 2013-04-16 MED ORDER — VITAMIN C 500 MG PO TABS
1000.0000 mg | ORAL_TABLET | Freq: Two times a day (BID) | ORAL | Status: DC
  Administered 2013-04-16 – 2013-04-22 (×11): 1000 mg via ORAL
  Filled 2013-04-16 (×13): qty 2

## 2013-04-16 MED ORDER — PRESERVISION AREDS PO CAPS: 2.0000 | ORAL_CAPSULE | Freq: Every day | ORAL | Status: DC

## 2013-04-16 MED ORDER — GABAPENTIN 400 MG PO CAPS
1500.0000 mg | ORAL_CAPSULE | Freq: Every day | ORAL | Status: DC
  Administered 2013-04-16 – 2013-04-21 (×6): 1500 mg via ORAL
  Filled 2013-04-16 (×7): qty 1

## 2013-04-16 MED ORDER — ONDANSETRON HCL 4 MG/2ML IJ SOLN: 4.0000 mg | Freq: Four times a day (QID) | INTRAMUSCULAR | Status: DC | PRN

## 2013-04-16 MED ORDER — POLYETHYL GLYCOL-PROPYL GLYCOL 0.4-0.3 % OP SOLN: 1.0000 [drp] | Freq: Four times a day (QID) | OPHTHALMIC | Status: DC | PRN

## 2013-04-16 MED ORDER — VITAMIN E 180 MG (400 UNIT) PO CAPS: 400.0000 [IU] | ORAL_CAPSULE | Freq: Every day | ORAL | Status: DC

## 2013-04-16 MED ORDER — SODIUM CHLORIDE 0.9 % IV SOLN: INTRAVENOUS | Status: AC

## 2013-04-16 MED ORDER — ZOLPIDEM TARTRATE 5 MG PO TABS
5.0000 mg | ORAL_TABLET | Freq: Every evening | ORAL | Status: DC | PRN
  Administered 2013-04-16 – 2013-04-21 (×6): 5 mg via ORAL
  Filled 2013-04-16 (×6): qty 1

## 2013-04-16 NOTE — ED Notes (Signed)
Pt reports generalized weakness x 2 weeks. Hx GI bleed. Gross blood on rectal exam. Pt pale. Pt also reports back pain x 1 week. Worse after eating. Denies CP.

## 2013-04-16 NOTE — Consult Note (Addendum)
Admit date: 04/16/2013 Referring Physician  Dr. Betti Cruz Primary Physician  Dr. Murray Hodgkins Primary Cardiologist  Verdis Prime, MD Reason for Consultation  Positive troponin  HPI: Justin Deleon is a 79 y.o. Caucasian male with history of prostate cancer, hypertension, cataracts, GERD, peripheral vascular disease, heart murmur with severe AS, and hypertension who presented to ER for further evaluation of anemia.   Patient is a poor historian, but was able to provide some history. Patient had apparently seen his primary care physician Dr. Chilton Si on 04/14/2013 and had labs checked, and his anemia was worse with a hemoglobin of 8.4 on 04/06/2013. He was referred to Dr. Danise Edge who had performed colonoscopy in 2009. Dr. Laural Benes checked patient's labs and he was anemic and as a result the patient was instructed to come to the emergency department for further care and management. In the emergency department patient was found to be anemic with hemoglobin of 6.1. Hospitalist service was asked to admit the patient for further care and management. Patient reported that he did notice some dark brown stools but denied any black stools or any overt blood in his stools. Has not had any recent fevers, chills, nausea, vomiting, chest pain, shortness of breath, diarrhea, headaches or vision changes. Patient does complain of nonspecific generalized abdominal pain which he has had since 04/14/2013. Patient denies any NSAID use, does admit to taking aspirin 81 mg daily, however is not recorded on his medication list.  He was found to have an elevated troponin although it is unclear why cardiac enzymes were checked since he denied chest pain or SOB.  Cardiology is now asked to consult.     PMH:   Past Medical History  Diagnosis Date  . Depressed   . Hernia   . Kidney stones   . Hypertension   . Prostate cancer   . Cataract 2012    Mohs MD  . Hypertrophy of prostate with urinary obstruction and other lower  urinary tract symptoms (LUTS) 08/26/2012  . Edema 08/26/2012  . Ectropion, unspecified 05/27/2012  . Abnormality of gait 11/27/2011  . Unspecified hereditary and idiopathic peripheral neuropathy 08/28/2011  . Basal cell carcinoma of scalp and skin of neck 06/26/2011  . Pain in limb 05/28/2011  . Closed fracture of midcervical section of femur 05/28/2011  . Other and unspecified hyperlipidemia 04/06/2011  . Anemia, unspecified 04/06/2011  . Peripheral vascular disease, unspecified 04/06/2011  . Reflux esophagitis 04/06/2011  . Unspecified constipation 04/06/2011  . Insomnia, unspecified 04/06/2011  . Undiagnosed cardiac murmurs 04/06/2011  . Hypertrophy of prostate with urinary obstruction and other lower urinary tract symptoms (LUTS) 2013  . Depression, acute   . Aortic valve stenosis, rheumatic      PSH:   Past Surgical History  Procedure Laterality Date  . Cholecystectomy  1963/1978  . Hernia repair  2008    umbilical  . Spine surgery  2010    lumbar-L3-4/L4-5  . Fracture surgery Left 03/2011    hip  . Eye surgery      retina repair right    Allergies:  Review of patient's allergies indicates no known allergies. Prior to Admit Meds:   Prescriptions prior to admission  Medication Sig Dispense Refill  . Ascorbic Acid (VITAMIN C) 1000 MG tablet Take 1,000 mg by mouth 2 (two) times daily.      Marland Kitchen buPROPion (WELLBUTRIN XL) 300 MG 24 hr tablet Take 300 mg by mouth every morning.      . cholecalciferol (VITAMIN  D) 1000 UNITS tablet Take 3,000 Units by mouth daily.      Marland Kitchen docusate sodium (COLACE) 100 MG capsule Take 300 mg by mouth daily.       . ferrous sulfate 325 (65 FE) MG tablet Take 325 mg by mouth daily with breakfast. Take 1 daily to treat anemia.      Marland Kitchen gabapentin (NEURONTIN) 300 MG capsule Take 1,500 mg by mouth at bedtime.      . Multiple Vitamins-Minerals (MULTIVITAMIN WITH MINERALS) tablet Take 1 tablet by mouth as directed.       . Multiple Vitamins-Minerals (PRESERVISION AREDS)  CAPS Take 2 capsules by mouth daily.      Bertram Gala Glycol-Propyl Glycol (SYSTANE) 0.4-0.3 % SOLN Apply to eye. One drop 4 times daily as needed for dry eyes      . polyethylene glycol powder (GLYCOLAX/MIRALAX) powder Take 17 g by mouth daily. Mix 17 grams with 8 oz. of water daily for laxative.      . potassium chloride (K-DUR,KLOR-CON) 10 MEQ tablet Take 10 mEq by mouth daily.      . Red Yeast Rice 600 MG TABS Take 3 tablets by mouth daily.       Marland Kitchen senna-docusate (SENNA PLUS) 8.6-50 MG per tablet Take 3 tablets by mouth daily.       . tamsulosin (FLOMAX) 0.4 MG CAPS Take one capsule by mouth once daily  30 capsule  6  . vitamin E (VITAMIN E) 1000 UNIT capsule Take 1,000 Units by mouth daily. Take 1 capsule daily for vitamin E supplement.      . vitamin E 400 UNIT capsule Take 400 Units by mouth daily.      . Zolpidem Tartrate 10 MG SUBL Take one tablet by mouth at bedtime as needed for rest.  30 tablet  0   Fam HX:    Family History  Problem Relation Age of Onset  . Heart disease Father     MI  . Cancer Sister     bladder   Social HX:    History   Social History  . Marital Status: Widowed    Spouse Name: N/A    Number of Children: N/A  . Years of Education: N/A   Occupational History  . Not on file.   Social History Main Topics  . Smoking status: Former Smoker    Types: Cigars    Quit date: 12/10/1987  . Smokeless tobacco: Never Used  . Alcohol Use: 0.6 oz/week    1 Cans of beer per week  . Drug Use: No  . Sexual Activity: No   Other Topics Concern  . Not on file   Social History Narrative   Lives at [facility]   Widower     ROS:  All 11 ROS were addressed and are negative except what is stated in the HPI  Physical Exam: Blood pressure 101/46, pulse 76, temperature 97.6 F (36.4 C), temperature source Oral, resp. rate 17, height 5\' 10"  (1.778 m), SpO2 100.00%.    General: Well developed, well nourished, in no acute distress Head: Eyes PERRLA, No  xanthomas.   Normal cephalic and atramatic  Lungs:   Clear bilaterally to auscultation and percussion. Heart:   HRRR S1 S2 Pulses are 2+ & equal.            No carotid bruit. No JVD.  No abdominal bruits. No femoral bruits. Abdomen: Bowel sounds are positive, abdomen soft and non-tender without masses  Extremities:  No clubbing, cyanosis or edema.  DP +1 Neuro: Alert and oriented X 3. Psych:  Good affect, responds appropriately    Labs:   Lab Results  Component Value Date   WBC 8.9 04/16/2013   HGB 6.1* 04/16/2013   HCT 19.4* 04/16/2013   MCV 79.5 04/16/2013   PLT 427* 04/16/2013    Recent Labs Lab 04/16/13 1832  NA 136  K 4.5  CL 101  CO2 24  BUN 28*  CREATININE 1.36*  CALCIUM 9.0  PROT 6.2  BILITOT 0.3  ALKPHOS 45  ALT 20  AST 25  GLUCOSE 115*   No results found for this basename: PTT   Lab Results  Component Value Date   INR 1.01 04/16/2013   INR 1.00 05/09/2011   INR 1.02 03/30/2011   Lab Results  Component Value Date   TROPONINI 0.89* 04/16/2013         Radiology:  No results found.  EKG:  NSR with LVH and mild repolarization abnormality  ASSESSMENT:  1.  Severe anemia 2.  Elevated troponin with no cardiac symptoms most likely secondary to demand ischemia in setting of severe AS. 3.  HTN 4.  Prostate CA 5.  PVD 6.  Severe AS - per Dr. Michaelle Copas last OV note not candidate for open heart AVR but could consider percutaneous procedure if needed for symptom relief.  PLAN:   1.  Cycle cardiac enzymes but this is most consistent with demand ischemia and not an acute coronary syndrome.  He is asymptomatic from a cardiac standpoint.  2.  Check 2D echo to assess LVF 3.  Transfuse as needed 4.  Further w/u per Dr. Flossie Dibble, MD  04/16/2013  10:19 PM

## 2013-04-16 NOTE — H&P (Signed)
Patient's PCP: Kimber Relic, MD Patient's GI: Dr. Danise Edge  Chief Complaint: Anemia  History of Present Illness: Justin Deleon is a 79 y.o. Caucasian male with history of prostate cancer, hypertension, cataracts, GERD, peripheral vascular disease, heart murmur, and hypertension who presents with the above complaints.  Patient is a poor historian, but was able to provide some history.  Patient had apparently seen his primary care physician Dr. Chilton Si on 04/14/2013 and had labs checked, and his anemia was worse with a hemoglobin of 8.4 on 04/06/2013.  He was referred to Dr. Danise Edge who had performed colonoscopy in 2009, results unavailable for my review.  Dr. Laural Benes checked patient's labs and he was anemic as a result was instructed to have the patient come to the emergency department for further care and management.  In the emergency department patient was found to be anemic with hemoglobin of 6.1.  Hospitalist service was asked to admit the patient for further care and management.  Patient reported that he did notice some dark brown stools but denied any black stools or any overt blood in his stools.  Has not had any recent fevers, chills, nausea, vomiting, chest pain, shortness of breath, diarrhea, headaches or vision changes.  Patient does complain of nonspecific generalized abdominal pain which he has had since 04/14/2013.  Patient denies any NSAID use, does admit to taking aspirin 81 mg daily, however is not recorded on his medication list.  Review of Systems: All systems reviewed with the patient and positive as per history of present illness, otherwise all other systems are negative.  Past Medical History  Diagnosis Date  . Depressed   . Hernia   . Kidney stones   . Hypertension   . Prostate cancer   . Cataract 2012    Mohs MD  . Hypertrophy of prostate with urinary obstruction and other lower urinary tract symptoms (LUTS) 08/26/2012  . Edema 08/26/2012  . Ectropion,  unspecified 05/27/2012  . Abnormality of gait 11/27/2011  . Unspecified hereditary and idiopathic peripheral neuropathy 08/28/2011  . Basal cell carcinoma of scalp and skin of neck 06/26/2011  . Pain in limb 05/28/2011  . Closed fracture of midcervical section of femur 05/28/2011  . Other and unspecified hyperlipidemia 04/06/2011  . Anemia, unspecified 04/06/2011  . Peripheral vascular disease, unspecified 04/06/2011  . Reflux esophagitis 04/06/2011  . Unspecified constipation 04/06/2011  . Insomnia, unspecified 04/06/2011  . Undiagnosed cardiac murmurs 04/06/2011  . Hypertrophy of prostate with urinary obstruction and other lower urinary tract symptoms (LUTS) 2013  . Depression, acute   . Aortic valve stenosis, rheumatic    Past Surgical History  Procedure Laterality Date  . Cholecystectomy  1963/1978  . Hernia repair  2008    umbilical  . Spine surgery  2010    lumbar-L3-4/L4-5  . Fracture surgery Left 03/2011    hip  . Eye surgery      retina repair right   Family History  Problem Relation Age of Onset  . Heart disease Father     MI  . Cancer Sister     bladder   History   Social History  . Marital Status: Widowed    Spouse Name: N/A    Number of Children: N/A  . Years of Education: N/A   Occupational History  . Not on file.   Social History Main Topics  . Smoking status: Former Smoker    Types: Cigars    Quit date: 12/10/1987  . Smokeless tobacco:  Never Used  . Alcohol Use: 0.6 oz/week    1 Cans of beer per week  . Drug Use: No  . Sexual Activity: No   Other Topics Concern  . Not on file   Social History Narrative   Lives at [facility]   Widower   Allergies: Review of patient's allergies indicates no known allergies.  Home Meds: Prior to Admission medications   Medication Sig Start Date End Date Taking? Authorizing Provider  Ascorbic Acid (VITAMIN C) 1000 MG tablet Take 1,000 mg by mouth 2 (two) times daily.   Yes Historical Provider, MD   buPROPion (WELLBUTRIN XL) 300 MG 24 hr tablet Take 300 mg by mouth every morning.   Yes Historical Provider, MD  cholecalciferol (VITAMIN D) 1000 UNITS tablet Take 3,000 Units by mouth daily.   Yes Historical Provider, MD  docusate sodium (COLACE) 100 MG capsule Take 300 mg by mouth daily.    Yes Historical Provider, MD  ferrous sulfate 325 (65 FE) MG tablet Take 325 mg by mouth daily with breakfast. Take 1 daily to treat anemia.   Yes Historical Provider, MD  gabapentin (NEURONTIN) 300 MG capsule Take 1,500 mg by mouth at bedtime.   Yes Historical Provider, MD  Multiple Vitamins-Minerals (MULTIVITAMIN WITH MINERALS) tablet Take 1 tablet by mouth as directed.    Yes Historical Provider, MD  Multiple Vitamins-Minerals (PRESERVISION AREDS) CAPS Take 2 capsules by mouth daily.   Yes Historical Provider, MD  Polyethyl Glycol-Propyl Glycol (SYSTANE) 0.4-0.3 % SOLN Apply to eye. One drop 4 times daily as needed for dry eyes   Yes Historical Provider, MD  polyethylene glycol powder (GLYCOLAX/MIRALAX) powder Take 17 g by mouth daily. Mix 17 grams with 8 oz. of water daily for laxative.   Yes Historical Provider, MD  potassium chloride (K-DUR,KLOR-CON) 10 MEQ tablet Take 10 mEq by mouth daily.   Yes Historical Provider, MD  Red Yeast Rice 600 MG TABS Take 3 tablets by mouth daily.    Yes Historical Provider, MD  senna-docusate (SENNA PLUS) 8.6-50 MG per tablet Take 3 tablets by mouth daily.    Yes Historical Provider, MD  tamsulosin (FLOMAX) 0.4 MG CAPS Take one capsule by mouth once daily 12/30/12  Yes Tiffany L Reed, DO  vitamin E (VITAMIN E) 1000 UNIT capsule Take 1,000 Units by mouth daily. Take 1 capsule daily for vitamin E supplement.   Yes Historical Provider, MD  vitamin E 400 UNIT capsule Take 400 Units by mouth daily.   Yes Historical Provider, MD  Zolpidem Tartrate 10 MG SUBL Take one tablet by mouth at bedtime as needed for rest. 03/31/13  Yes Kimber Relic, MD    Physical Exam: Blood pressure  101/46, pulse 76, temperature 97.6 F (36.4 C), temperature source Oral, resp. rate 17, height 5\' 10"  (1.778 m), SpO2 100.00%. General: Awake, Oriented x3, No acute distress. HEENT: EOMI, Moist mucous membranes Neck: Supple CV: S1 and S2 Lungs: Clear to ascultation bilaterally Abdomen: Soft, Nontender, Nondistended, +bowel sounds. Ext: Good pulses. Trace edema. No clubbing or cyanosis noted. Neuro: Cranial Nerves II-XII grossly intact. Has 5/5 motor strength in upper and lower extremities.  Lab results:  Recent Labs  04/16/13 1832  NA 136  K 4.5  CL 101  CO2 24  GLUCOSE 115*  BUN 28*  CREATININE 1.36*  CALCIUM 9.0    Recent Labs  04/16/13 1832  AST 25  ALT 20  ALKPHOS 45  BILITOT 0.3  PROT 6.2  ALBUMIN 3.6  No results found for this basename: LIPASE, AMYLASE,  in the last 72 hours  Recent Labs  04/16/13 1832  WBC 8.9  NEUTROABS 7.0  HGB 6.1*  HCT 19.4*  MCV 79.5  PLT 427*   No results found for this basename: CKTOTAL, CKMB, CKMBINDEX, TROPONINI,  in the last 72 hours No components found with this basename: POCBNP,  No results found for this basename: DDIMER,  in the last 72 hours No results found for this basename: HGBA1C,  in the last 72 hours No results found for this basename: CHOL, HDL, LDLCALC, TRIG, CHOLHDL, LDLDIRECT,  in the last 72 hours No results found for this basename: TSH, T4TOTAL, FREET3, T3FREE, THYROIDAB,  in the last 72 hours No results found for this basename: VITAMINB12, FOLATE, FERRITIN, TIBC, IRON, RETICCTPCT,  in the last 72 hours Imaging results:  No results found. Other results: EKG: Normal sinus rhythm with heart rate of 84.  Assessment & Plan by Problem: Acute blood loss anemia Etiology unclear, presumed to be due to GI bleed in origin.  Patient has been started on pantoprazole which will be continued.  Patient receiving 2 units of PRBC, cycle CBC every 8 hours, transfuse if hemoglobin less than 8.0.  Discontinue aspirin.  Case  discussed with Dr. Dulce Sellar who will evaluate the patient in the morning.  Will have the patient n.p.o. except sips with medications.  Elevated troponin Likely due to demand ischemia from anemia.  Patient currently denies any chest pain.  Case discussed with Dr. Mayford Knife.  Medical management for now, cannot anticoagulate the patient with aspirin or heparin due to anemia and concern for bleeding.  Will get a 2-D echocardiogram in the morning.  Cardiology will see the patient in the morning.  Hypertension Stable.  Patient is currently not on any antihypertensive medications.  Continue to monitor.  Generalized weakness Likely due to anemia.  Mild renal insufficiency Suspect patient has underlying chronic kidney disease stage III/II at baseline.  Continue to monitor.  BPH with history of prostate cancer Continue Flomax.  Peripheral vascular disease Stable.  Prophylaxis SCDs.  No heparin products given concern for bleed and anemia.  CODE STATUS DO NOT RESUSCITATE/DO NOT INTUBATE.  This was discussed with the patient and son at the time of admission.  Disposition Admit the patient to step down for close monitoring as an inpatient.  Time spent on admission, talking to the patient, and coordinating care was: 50 mins.  Ludwin Flahive A, MD 04/16/2013, 9:21 PM

## 2013-04-16 NOTE — ED Notes (Signed)
Pt sent here by pcp for hemaglobin of 6.  Sees GI for blood in stool.  C/o weakness, sob and R shoulder pain.

## 2013-04-16 NOTE — ED Notes (Signed)
Pt now reporting RLQ pain after getting up to use restroom. States it is better "when I burp." MD at bedside.

## 2013-04-16 NOTE — ED Notes (Signed)
Report called Primary RN on 2C. Pt will be transported to radiology prior to being transferred to room. Pt tolerating blood well. No suspected reaction. Admitting MD at bedside.

## 2013-04-16 NOTE — ED Notes (Signed)
CRITICAL VALUE ALERT  Critical value received:  Troponin 0.23 and Hemoglobin 6.2  Date of notification:  04/16/2013  Time of notification:  1859  Critical value read back: Yes  Nurse who received alert:  Christin Bach   MD notified (1st page):  Dr Romeo Apple

## 2013-04-16 NOTE — Telephone Encounter (Signed)
Patient called and stated that Dr. Laural Benes with [facility]  told patient that he had a Hemoglobin of 6 and they told him to call us, his Primary, for [place] to refer him to the hospital. I told patient that he should go ahead and go to the hospital if he had a hemoglobin of 6 to be treated. Patient got angry with me. Told patient that Dr. Laural Benes should have sent him to the hospital, but he needed to go ahead and go. Patient got angry and hung up on me.  Dr. Chilton Si called the office right after patient hung up and I spoke with him and informed him of the patient's conversation and Dr. Chilton Si got patient's number and is going to call him.

## 2013-04-16 NOTE — ED Notes (Signed)
Patient lab results was reported to Nurse Britney.

## 2013-04-16 NOTE — ED Provider Notes (Signed)
CSN: [id number]     Arrival date & time 04/16/13  1756 History   First MD Initiated Contact with Patient 04/16/13 1831     Chief Complaint  Patient presents with  . Anemia   (Consider location/radiation/quality/duration/timing/severity/associated sxs/prior Treatment) Patient is a 79 y.o. male presenting with anemia. The history is provided by the patient.  Anemia This is a new problem. Episode onset: 3 days ago. The problem occurs constantly. The problem has been gradually worsening. Pertinent negatives include no chest pain, no abdominal pain, no headaches and no shortness of breath. Nothing aggravates the symptoms. Nothing relieves the symptoms. He has tried nothing for the symptoms. The treatment provided no relief.    Past Medical History  Diagnosis Date  . Depressed   . Hernia   . Kidney stones   . Hypertension   . Prostate cancer   . Cataract 2012    Mohs MD  . Hypertrophy of prostate with urinary obstruction and other lower urinary tract symptoms (LUTS) 08/26/2012  . Edema 08/26/2012  . Ectropion, unspecified 05/27/2012  . Abnormality of gait 11/27/2011  . Unspecified hereditary and idiopathic peripheral neuropathy 08/28/2011  . Basal cell carcinoma of scalp and skin of neck 06/26/2011  . Pain in limb 05/28/2011  . Closed fracture of midcervical section of femur 05/28/2011  . Other and unspecified hyperlipidemia 04/06/2011  . Anemia, unspecified 04/06/2011  . Peripheral vascular disease, unspecified 04/06/2011  . Reflux esophagitis 04/06/2011  . Unspecified constipation 04/06/2011  . Insomnia, unspecified 04/06/2011  . Undiagnosed cardiac murmurs 04/06/2011  . Hypertrophy of prostate with urinary obstruction and other lower urinary tract symptoms (LUTS) 2013  . Depression, acute   . Aortic valve stenosis, rheumatic    Past Surgical History  Procedure Laterality Date  . Cholecystectomy  1963/1978  . Hernia repair  2008    umbilical  . Spine surgery  2010    lumbar-L3-4/L4-5   . Fracture surgery Left 03/2011    hip  . Eye surgery      retina repair right   Family History  Problem Relation Age of Onset  . Heart disease Father     MI  . Cancer Sister     bladder   History  Substance Use Topics  . Smoking status: Former Smoker    Types: Cigars    Quit date: 12/10/1987  . Smokeless tobacco: Never Used  . Alcohol Use: 0.6 oz/week    1 Cans of beer per week    Review of Systems  Constitutional: Negative for fever.  HENT: Negative for drooling and rhinorrhea.   Eyes: Negative for pain.  Respiratory: Negative for cough and shortness of breath.   Cardiovascular: Negative for chest pain and leg swelling.  Gastrointestinal: Negative for nausea, vomiting, abdominal pain and diarrhea.  Genitourinary: Negative for dysuria and hematuria.  Musculoskeletal: Positive for back pain (mild upper back pain for 1-2 weeks). Negative for gait problem and neck pain.  Skin: Negative for color change.  Neurological: Positive for weakness (generalized). Negative for numbness and headaches.  Hematological: Negative for adenopathy.  Psychiatric/Behavioral: Negative for behavioral problems.  All other systems reviewed and are negative.    Allergies  Review of patient's allergies indicates no known allergies.  Home Medications   Current Outpatient Rx  Name  Route  Sig  Dispense  Refill  . beta carotene w/minerals (OCUVITE) tablet   Oral   Take 1 tablet by mouth daily.          Marland Kitchen buPROPion (  WELLBUTRIN XL) 300 MG 24 hr tablet      TAKE 1 TABLET BY MOUTH DAILY   90 tablet   0     **Patient requests 90 days supply**   . Cholecalciferol (VITAMIN D) 2000 UNITS tablet   Oral   Take 2,000 Units by mouth daily.           Marland Kitchen docusate sodium (COLACE) 100 MG capsule   Oral   Take 300 mg by mouth daily. Take three capsules to daily to soften stool.         . ferrous sulfate 325 (65 FE) MG tablet   Oral   Take 325 mg by mouth daily with breakfast. Take 1 daily  to treat anemia.         Marland Kitchen gabapentin (NEURONTIN) 300 MG capsule      Use up to 5 capsules at bed to help pains in feet   150 capsule   3   . HYDROcodone-acetaminophen (NORCO/VICODIN) 5-325 MG per tablet   Oral   Take 1 tablet by mouth every 4 (four) hours as needed for pain. Take 1 tablet every 4 hours as needed for moderate pain. Take 2 tablets every 4 hours as needed for severe pain.         . methocarbamol (ROBAXIN) 500 MG tablet   Oral   Take 500 mg by mouth 4 (four) times daily. Take 1 tablet every 6 hours as needed for spasms.         . Multiple Vitamins-Minerals (MULTIVITAMIN WITH MINERALS) tablet   Oral   Take 1 tablet by mouth as directed.          Marland Kitchen omeprazole (PRILOSEC) 20 MG capsule   Oral   Take 20 mg by mouth daily. Take one daily to reduce stomach acid and to help heartburn.         Bertram Gala Glycol-Propyl Glycol (SYSTANE) 0.4-0.3 % SOLN   Ophthalmic   Apply to eye. One drop 4 times daily as needed for dry eyes         . polyethylene glycol powder (GLYCOLAX/MIRALAX) powder   Oral   Take 17 g by mouth daily. Mix 17 grams with 8 oz. of water daily for laxative.         . Red Yeast Rice 600 MG TABS   Oral   Take 1 tablet by mouth 2 (two) times daily. Take two tablets daily to lower cholesterol.         . senna-docusate (SENNA PLUS) 8.6-50 MG per tablet   Oral   Take 2 tablets by mouth at bedtime. Take 2 tablets every night at bedtime for constipation.         . tamsulosin (FLOMAX) 0.4 MG CAPS      Take one capsule by mouth once daily   30 capsule   6   . temazepam (RESTORIL) 15 MG capsule   Oral   Take 45 mg by mouth daily as needed. For sleep          . traMADol (ULTRAM) 50 MG tablet   Oral   Take 100 mg by mouth daily.           . vitamin C (ASCORBIC ACID) 500 MG tablet   Oral   Take 500 mg by mouth daily. Take 1 tablet twice daily for vitamin C supplement.         . vitamin E (VITAMIN E) 1000 UNIT capsule   Oral  Take 1,000 Units by mouth daily. Take 1 capsule daily for vitamin E supplement.         . Zolpidem Tartrate 10 MG SUBL      Take one tablet by mouth at bedtime as needed for rest.   30 tablet   0    BP 104/57  Pulse 86  Temp(Src) 98.3 F (36.8 C) (Oral)  Resp 16  Ht 5\' 10"  (1.778 m)  SpO2 97% Physical Exam  Nursing note and vitals reviewed. Constitutional: He is oriented to person, place, and time. He appears well-developed and well-nourished.  Pale appearing.   HENT:  Head: Normocephalic and atraumatic.  Right Ear: External ear normal.  Left Ear: External ear normal.  Nose: Nose normal.  Mouth/Throat: Oropharynx is clear and moist. No oropharyngeal exudate.  Eyes: Conjunctivae and EOM are normal. Pupils are equal, round, and reactive to light.  Neck: Normal range of motion. Neck supple.  Cardiovascular: Normal rate, regular rhythm, normal heart sounds and intact distal pulses.  Exam reveals no gallop and no friction rub.   No murmur heard. Pulmonary/Chest: Effort normal and breath sounds normal. No respiratory distress. He has no wheezes.  Abdominal: Soft. Bowel sounds are normal. He exhibits no distension. There is no tenderness. There is no rebound and no guarding.  Genitourinary:  Dark bloody stool on rectal exam. No ttp.   Musculoskeletal: Normal range of motion. He exhibits no edema and no tenderness.  Neurological: He is alert and oriented to person, place, and time.  Skin: Skin is warm and dry.  Psychiatric: He has a normal mood and affect. His behavior is normal.    ED Course  Procedures (including critical care time) Labs Review Labs Reviewed  COMPREHENSIVE METABOLIC PANEL - Abnormal; Notable for the following:    Glucose, Bld 115 (*)    BUN 28 (*)    Creatinine, Ser 1.36 (*)    GFR calc non Af Amer 46 (*)    GFR calc Af Amer 53 (*)    All other components within normal limits  CBC WITH DIFFERENTIAL - Abnormal; Notable for the following:    RBC 2.44 (*)     Hemoglobin 6.1 (*)    HCT 19.4 (*)    MCH 25.0 (*)    RDW 16.9 (*)    Platelets 427 (*)    Neutrophils Relative % 78 (*)    All other components within normal limits  TROPONIN I - Abnormal; Notable for the following:    Troponin I 0.89 (*)    All other components within normal limits  TROPONIN I - Abnormal; Notable for the following:    Troponin I 4.33 (*)    All other components within normal limits  TROPONIN I - Abnormal; Notable for the following:    Troponin I 3.26 (*)    All other components within normal limits  CK TOTAL AND CKMB - Abnormal; Notable for the following:    Total CK 256 (*)    CK, MB 41.2 (*)    Relative Index 16.1 (*)    All other components within normal limits  CK TOTAL AND CKMB - Abnormal; Notable for the following:    Total CK 291 (*)    CK, MB 49.7 (*)    Relative Index 17.1 (*)    All other components within normal limits  CBC - Abnormal; Notable for the following:    RBC 2.63 (*)    Hemoglobin 6.8 (*)    HCT 21.0 (*)  MCH 25.9 (*)    RDW 16.0 (*)    All other components within normal limits  BASIC METABOLIC PANEL - Abnormal; Notable for the following:    GFR calc non Af Amer 49 (*)    GFR calc Af Amer 57 (*)    All other components within normal limits  PRO B NATRIURETIC PEPTIDE - Abnormal; Notable for the following:    Pro B Natriuretic peptide (BNP) 4224.0 (*)    All other components within normal limits  POCT I-STAT TROPONIN I - Abnormal; Notable for the following:    Troponin i, poc 0.23 (*)    All other components within normal limits  MRSA PCR SCREENING  PROTIME-INR  LACTIC ACID, PLASMA  URINALYSIS, ROUTINE W REFLEX MICROSCOPIC  LACTATE DEHYDROGENASE  CBC  CBC  HAPTOGLOBIN  FERRITIN  FOLATE  IRON AND TIBC  PATHOLOGIST SMEAR REVIEW  RETICULOCYTES  VITAMIN B12  TYPE AND SCREEN  PREPARE RBC (CROSSMATCH)  ABO/RH  PREPARE RBC (CROSSMATCH)  PREPARE RBC (CROSSMATCH)   Imaging Review Dg Abd 2 Views  04/16/2013    *RADIOLOGY REPORT*  Clinical Data: Epigastric abdominal pain for 2 weeks.  ABDOMEN - 2 VIEW  Comparison: Pelvic radiograph performed 03/30/2011  Findings: The visualized bowel gas pattern is unremarkable. Scattered air and fluid filled loops of colon are seen; no abnormal dilatation of small bowel loops is seen to suggest small bowel obstruction.  No free intra-abdominal air is identified, though evaluation for free air is limited on a single supine view.  Degenerative change is noted at the lower lumbar spine; the sacroiliac joints are unremarkable in appearance.  A small left pleural effusion is noted; calcified granulomata are seen overlying the left lung base.  Clips are noted within the right upper quadrant, reflecting prior cholecystectomy.  Brachytherapy seeds are noted at the prostate bed.  Screws are seen within the proximal left femur.  IMPRESSION:  1.  Unremarkable bowel gas pattern; no free intra-abdominal air seen. 2.  Small left pleural effusion noted.   Original Report Authenticated By: Tonia Ghent, M.D.     Date: 04/16/2013  Rate: 84  Rhythm: normal sinus rhythm  QRS Axis: normal  Intervals: normal  ST/T Wave abnormalities: normal  Conduction Disutrbances:none  Narrative Interpretation: LVH, Flattening of t wave in V2  Old EKG Reviewed: unchanged  CRITICAL CARE Performed by: Purvis Sheffield, S Total critical care time: 30 min Critical care time was exclusive of separately billable procedures and treating other patients. Critical care was necessary to treat or prevent imminent or life-threatening deterioration. Critical care was time spent personally by me on the following activities: development of treatment plan with patient and/or surrogate as well as nursing, discussions with consultants, evaluation of patient's response to treatment, examination of patient, obtaining history from patient or surrogate, ordering and performing treatments and interventions, ordering and review  of laboratory studies, ordering and review of radiographic studies, pulse oximetry and re-evaluation of patient's condition.  MDM   1. Anemia   2. GI bleed   3. Elevated troponin   4. Generalized weakness   5. Renal insufficiency   6. Anemia, unspecified   7. Hypertension   8. Acute diastolic heart failure   9. Calcific aortic stenosis   10. Aortic stenosis, moderate    6:58 PM 79 y.o. male who presents with reported hemoglobin of 6. The patient was seen earlier in the week by his primary care provider who noted Hemoccult-positive stool and a lower hemoglobin compared to his baseline.  The patient followed up with GI who found an even lower hemoglobin patient was sent here for evaluation. He notes mild weakness today but denies any shortness of breath, dizziness, or chest pain. He is afebrile and vital signs are unremarkable here. He has dark bloody stool on my exam. Will get consent for blood transfusion and get lab work.  Pt will be admitted to hospitalist. I discussed elevated trop w/ the hospitalist, possibly inc cardiac demand d/ t anemia. No obvious changes on ecg. Will send another trop as initial was [place].   Critical care time documented as there were repeat evaluations, anemia requiring blood transfusion, consultation w/ hospitalist, chart review, and discussion with the family.     Junius Argyle, MD 04/17/13 1316

## 2013-04-17 ENCOUNTER — Encounter (HOSPITAL_COMMUNITY): Payer: Self-pay | Admitting: *Deleted

## 2013-04-17 ENCOUNTER — Encounter (HOSPITAL_COMMUNITY)
Admission: EM | Disposition: A | Discharge status: Skilled Nursing Facility | Payer: Self-pay | Source: Home / Self Care | Attending: Internal Medicine

## 2013-04-17 DIAGNOSIS — I059 Rheumatic mitral valve disease, unspecified: Secondary | ICD-10-CM

## 2013-04-17 DIAGNOSIS — I5031 Acute diastolic (congestive) heart failure: Secondary | ICD-10-CM | POA: Insufficient documentation

## 2013-04-17 DIAGNOSIS — I350 Nonrheumatic aortic (valve) stenosis: Secondary | ICD-10-CM | POA: Diagnosis present

## 2013-04-17 DIAGNOSIS — I359 Nonrheumatic aortic valve disorder, unspecified: Secondary | ICD-10-CM

## 2013-04-17 HISTORY — PX: ESOPHAGOGASTRODUODENOSCOPY: SHX5428

## 2013-04-17 LAB — BASIC METABOLIC PANEL
CO2: 23 mEq/L (ref 19–32)
Calcium: 8.5 mg/dL (ref 8.4–10.5)
GFR calc Af Amer: 57 mL/min — ABNORMAL LOW (ref >90)
GFR calc non Af Amer: 49 mL/min — ABNORMAL LOW (ref >90)
Potassium: 4 mEq/L (ref 3.5–5.1)
Sodium: 137 mEq/L (ref 135–145)

## 2013-04-17 LAB — CBC
HCT: 27.1 % — ABNORMAL LOW (ref 39.0–52.0)
Hemoglobin: 6.8 g/dL — CL (ref 13.0–17.0)
Hemoglobin: 8.9 g/dL — ABNORMAL LOW (ref 13.0–17.0)
MCH: 25.9 pg — ABNORMAL LOW (ref 26.0–34.0)
MCH: 25.9 pg — ABNORMAL LOW (ref 26.0–34.0)
MCH: 26.6 pg (ref 26.0–34.0)
MCHC: 32.4 g/dL (ref 30.0–36.0)
MCHC: 32.1 g/dL (ref 30.0–36.0)
MCHC: 32.8 g/dL (ref 30.0–36.0)
MCV: 79.8 fL (ref 78.0–100.0)
MCV: 80.7 fL (ref 78.0–100.0)
Platelets: 351 10*3/uL (ref 150–400)
Platelets: 318 10*3/uL (ref 150–400)
RBC: 2.63 MIL/uL — ABNORMAL LOW (ref 4.22–5.81)
RBC: 3.35 MIL/uL — ABNORMAL LOW (ref 4.22–5.81)
RDW: 16.4 % — ABNORMAL HIGH (ref 11.5–15.5)
RDW: 16.2 % — ABNORMAL HIGH (ref 11.5–15.5)
WBC: 11.7 10*3/uL — ABNORMAL HIGH (ref 4.0–10.5)

## 2013-04-17 LAB — RETICULOCYTES
RBC.: 3.29 MIL/uL — ABNORMAL LOW (ref 4.22–5.81)
Retic Count, Absolute: 29.6 10*3/uL (ref 19.0–186.0)

## 2013-04-17 LAB — HAPTOGLOBIN: Haptoglobin: 41 mg/dL — ABNORMAL LOW (ref 45–215)

## 2013-04-17 LAB — CK TOTAL AND CKMB (NOT AT ARMC)
CK, MB: 49.7 ng/mL (ref 0.3–4.0)
Relative Index: 16.1 — ABNORMAL HIGH (ref 0.0–2.5)
Relative Index: 17.1 — ABNORMAL HIGH (ref 0.0–2.5)
Total CK: 291 U/L — ABNORMAL HIGH (ref 7–232)

## 2013-04-17 LAB — LACTATE DEHYDROGENASE: LDH: 175 U/L (ref 94–250)

## 2013-04-17 LAB — PRO B NATRIURETIC PEPTIDE: Pro B Natriuretic peptide (BNP): 4224 pg/mL — ABNORMAL HIGH (ref 0–450)

## 2013-04-17 SURGERY — EGD (ESOPHAGOGASTRODUODENOSCOPY)
Anesthesia: Moderate Sedation

## 2013-04-17 MED ORDER — MIDAZOLAM HCL 10 MG/2ML IJ SOLN
INTRAMUSCULAR | Status: DC | PRN
  Administered 2013-04-17 (×2): 1.5 mg via INTRAVENOUS

## 2013-04-17 MED ORDER — FENTANYL CITRATE 0.05 MG/ML IJ SOLN
INTRAMUSCULAR | Status: AC
  Filled 2013-04-17: qty 2

## 2013-04-17 MED ORDER — SODIUM CHLORIDE 0.9 % IV SOLN: INTRAVENOUS | Status: DC

## 2013-04-17 MED ORDER — MIDAZOLAM HCL 5 MG/ML IJ SOLN
INTRAMUSCULAR | Status: AC
  Filled 2013-04-17: qty 1

## 2013-04-17 MED ORDER — MENTHOL 3 MG MT LOZG
1.0000 | LOZENGE | OROMUCOSAL | Status: DC | PRN
  Filled 2013-04-17: qty 9

## 2013-04-17 MED ORDER — BUTAMBEN-TETRACAINE-BENZOCAINE 2-2-14 % EX AERO
INHALATION_SPRAY | CUTANEOUS | Status: DC | PRN
  Administered 2013-04-17: 1 via TOPICAL

## 2013-04-17 MED ORDER — FENTANYL CITRATE 0.05 MG/ML IJ SOLN
INTRAMUSCULAR | Status: DC | PRN
  Administered 2013-04-17: 25 ug via INTRAVENOUS

## 2013-04-17 NOTE — Consult Note (Signed)
Subjective:   HPI The patient is an 79 year old male who was admitted to the hospital after being found to be very anemic. The patient has not noticed any blood in his stool however in reviewing records from Dr. Henriette Combs office note it mentions that he was seen by his primary care physician on September 29 and maroon heme positive stools were found on rectal exam. He denies vomiting or hematemesis. He said he had a little abdominal discomfort earlier this week but not now. He had a colonoscopy in 2009 with findings only of one small cecal polyp which was removed and it was a tubular adenoma. He does take an aspirin a day. Denies history of peptic ulcer disease.   Review of Systems No current chest pain or shortness of breath  Past Medical History  Diagnosis Date  . Depressed   . Hernia   . Kidney stones   . Hypertension   . Prostate cancer   . Cataract 2012    Mohs MD  . Hypertrophy of prostate with urinary obstruction and other lower urinary tract symptoms (LUTS) 08/26/2012  . Edema 08/26/2012  . Ectropion, unspecified 05/27/2012  . Abnormality of gait 11/27/2011  . Unspecified hereditary and idiopathic peripheral neuropathy 08/28/2011  . Basal cell carcinoma of scalp and skin of neck 06/26/2011  . Pain in limb 05/28/2011  . Closed fracture of midcervical section of femur 05/28/2011  . Other and unspecified hyperlipidemia 04/06/2011  . Anemia, unspecified 04/06/2011  . Peripheral vascular disease, unspecified 04/06/2011  . Reflux esophagitis 04/06/2011  . Unspecified constipation 04/06/2011  . Insomnia, unspecified 04/06/2011  . Undiagnosed cardiac murmurs 04/06/2011  . Hypertrophy of prostate with urinary obstruction and other lower urinary tract symptoms (LUTS) 2013  . Depression, acute   . Aortic valve stenosis, rheumatic    Past Surgical History  Procedure Laterality Date  . Cholecystectomy  1963/1978  . Hernia repair  2008    umbilical  . Spine surgery  2010    lumbar-L3-4/L4-5   . Fracture surgery Left 03/2011    hip  . Eye surgery      retina repair right   History   Social History  . Marital Status: Widowed    Spouse Name: N/A    Number of Children: N/A  . Years of Education: N/A   Occupational History  . Not on file.   Social History Main Topics  . Smoking status: Former Smoker    Types: Cigars    Quit date: 12/10/1987  . Smokeless tobacco: Never Used  . Alcohol Use: 0.6 oz/week    1 Cans of beer per week  . Drug Use: No  . Sexual Activity: No   Other Topics Concern  . Not on file   Social History Narrative   [facility] at [facility]   Widower   family history includes Cancer in his sister; Heart disease in his father. Current facility-administered medications:0.9 %  sodium chloride infusion, , Intravenous, STAT, Junius Argyle, MD, Last Rate: 125 mL/hr at 04/16/13 2309;  0.9 %  sodium chloride infusion, , Intravenous, Continuous, Cristal Ford, MD;  acetaminophen (TYLENOL) suppository 650 mg, 650 mg, Rectal, Q6H PRN, Cristal Ford, MD;  acetaminophen (TYLENOL) tablet 650 mg, 650 mg, Oral, Q6H PRN, Cristal Ford, MD, 650 mg at 04/17/13 0045 buPROPion (WELLBUTRIN XL) 24 hr tablet 300 mg, 300 mg, Oral, BH-q7a, Cristal Ford, MD, 300 mg at 04/17/13 0816;  cholecalciferol (VITAMIN D) tablet 3,000 Units, 3,000 Units, Oral,  Daily, Cristal Ford, MD, 3,000 Units at 04/17/13 1035;  docusate sodium (COLACE) capsule 300 mg, 300 mg, Oral, Daily, Cristal Ford, MD;  gabapentin (NEURONTIN) capsule 1,500 mg, 1,500 mg, Oral, QHS, Cristal Ford, MD, 1,500 mg at 04/16/13 2305 multivitamin-lutein (OCUVITE-LUTEIN) capsule 2 capsule, 2 capsule, Oral, Daily, Cristal Ford, MD;  ondansetron (ZOFRAN) injection 4 mg, 4 mg, Intravenous, Q6H PRN, Cristal Ford, MD;  ondansetron (ZOFRAN) tablet 4 mg, 4 mg, Oral, Q6H PRN, Cristal Ford, MD;  Melene Muller ON 04/20/2013] pantoprazole (PROTONIX) injection 40 mg, 40 mg, Intravenous, Q12H, Junius Argyle,  MD polyethylene glycol (MIRALAX / GLYCOLAX) packet 17 g, 17 g, Oral, Daily, Cristal Ford, MD;  polyvinyl alcohol (LIQUIFILM TEARS) 1.4 % ophthalmic solution 1 drop, 1 drop, Both Eyes, QID PRN, Cristal Ford, MD;  senna-docusate (Senokot-S) tablet 3 tablet, 3 tablet, Oral, Daily, Cristal Ford, MD;  sodium chloride 0.9 % injection 3 mL, 3 mL, Intravenous, Q12H, Cristal Ford, MD, 3 mL at 04/16/13 2318 tamsulosin (FLOMAX) capsule 0.4 mg, 0.4 mg, Oral, QPC breakfast, Cristal Ford, MD, 0.4 mg at 04/17/13 8119;  vitamin C (ASCORBIC ACID) tablet 1,000 mg, 1,000 mg, Oral, BID, Cristal Ford, MD, 1,000 mg at 04/17/13 1035;  vitamin E capsule 1,000 Units, 1,000 Units, Oral, Daily, Cristal Ford, MD, 1,000 Units at 04/17/13 1034;  zolpidem (AMBIEN) tablet 5 mg, 5 mg, Oral, QHS PRN, Cristal Ford, MD, 5 mg at 04/16/13 2305 No Known Allergies   Objective:     BP 100/48  Pulse 64  Temp(Src) 97.6 F (36.4 C) (Oral)  Resp 16  Ht 5\' 10"  (1.778 m)  Wt 82.1 kg (181 lb)  BMI 25.97 kg/m2  SpO2 94%  He is in no distress  Heart regular rhythm with systolic murmur  Lungs clear  Abdomen soft nontender, no masses  Laboratory No components found with this basename: d1      Assessment:     Anemia  GI bleed      Plan:     PPI therapy. Transfuse as needed. He is currently receiving his third unit of packed red cells. Plan for EGD today to evaluate upper GI tract. Lab Results  Component Value Date   HGB 6.8* 04/17/2013   HGB 6.1* 04/16/2013   HGB 11.0* 03/19/2012   HGB 9.5* 03/09/2012   HGB 11.2* 03/07/2012   HGB 12.6* 05/09/2011   HCT 21.0* 04/17/2013   HCT 19.4* 04/16/2013   HCT 35.8* 03/19/2012   HCT 31.3* 03/09/2012   HCT 36.0* 03/07/2012   HCT 37.0* 05/09/2011   ALKPHOS 45 04/16/2013   ALKPHOS 56 03/30/2011   AST 25 04/16/2013   AST 21 03/30/2011   ALT 20 04/16/2013   ALT 18 03/30/2011

## 2013-04-17 NOTE — Clinical Social Work Psychosocial (Signed)
Clinical Social Work Department BRIEF PSYCHOSOCIAL ASSESSMENT 04/17/2013  Patient:  Justin Deleon, Justin Deleon     Account Number:  [id number]     Admit date:  04/16/2013  Clinical Social Worker:  Varney Biles  Date/Time:  04/17/2013 11:01 AM  Referred by:  Physician  Date Referred:  04/17/2013 Referred for  Other - See comment   Other Referral:   CSW received a call from Justin Deleon at [facility]. Justin Deleon explained that pt came to hospital from the independent living level at [facility].   Interview type:  Patient Other interview type:    PSYCHOSOCIAL DATA Living Status:  FACILITY Admitted from facility[facility]  [facility] Level of care:  Independent Living Primary support name:  Justin Deleon Primary support relationship to patient:  CHILD, ADULT Degree of support available:   Great--pt has 3 adult children who live in [place], [place], [place]). All 3 children are actively involved in pt's life and visit him at the hospital.    CURRENT CONCERNS Current Concerns  Post-Acute Placement   Other Concerns:    SOCIAL WORK ASSESSMENT / PLAN Pt explained that he is having some tests run, but that he anticipates returning to [facility] at the independent living level. Pt engaged in life review with CSW, explaining that his wife Justin Deleon died in 2022/08/05 and detailing his family support. CSW provided support and active listening. CSW explained to pt that if he requires a higher level of care, she will facilitate this discharge plan. Pt understanding of CSW role, but makes it clear that he wants to return to his apartment at the independent living level.   Assessment/plan status:  Psychosocial Support/Ongoing Assessment of Needs Other assessment/ plan:   Information/referral to community resources:   [employer].    PATIENT'S/FAMILY'S RESPONSE TO PLAN OF CARE: Pt receptive to CSW visit, and appreciative of support. Pt understanding of CSW role, and has given CSW permission  to update Justin Deleon at [facility] concerning his treatment here in the hospital.       Maryclare Labrador, MSW, [facility] Clinical Social Worker [phone]

## 2013-04-17 NOTE — Op Note (Signed)
[name] [address] [place], [address]   ENDOSCOPY PROCEDURE REPORT  PATIENT: Justin Deleon, Justin Deleon  MR#: [id number] BIRTHDATE: Dec 04, 1925 , 87  yrs. old GENDER: Male ENDOSCOPIST: Wandalee Ferdinand, MD REFERRED BY: PROCEDURE DATE:  04/17/2013 PROCEDURE:   EGD ASA CLASS: 3 INDICATIONS: severe anemia, heme positive stool MEDICATIONS: fentanyl 25 mcg IV, Versed 3 mg IV TOPICAL ANESTHETIC:  DESCRIPTION OF PROCEDURE:   After the risks benefits and alternatives of the procedure were thoroughly explained, informed consent was obtained.  The Pentax Gastroscope [id number]  endoscope was introduced through the mouth and advanced to the second portion of the duodenum      , limited by Without limitations.   The instrument was slowly withdrawn as the mucosa was fully examined.      FINDINGS:  Esophagus: Normal  Stomach: Normal  Duodenum: Normal  COMPLICATIONS:none  ENDOSCOPIC IMPRESSION:normal EGD, there is nothing to explain anemia on this exam   RECOMMENDATIONS:consider repeat colonoscopy, I will have further discussions with the patient tomorrow in regards to this.      _______________________________ eSigned:  Wandalee Ferdinand, MD 04/17/2013 3:00 PM

## 2013-04-17 NOTE — Clinical Social Work Note (Signed)
CSW spoke with Asher Muir, social services representative from [facility] requested a potential discharge date for pt; CSW called the pt's nurse and asked for this information. Nurse presumes pt may transfer tomorrow or in 2 days. CSW called Asher Muir back and provided her with this info. CSW reinforced that pt will probably go back to independent living level, but CSW will update facility if this changes.    Maryclare Labrador, MSW, [facility] Clinical Social Worker [phone]

## 2013-04-17 NOTE — Progress Notes (Signed)
[facility] Progress Note [place] TEAM 1 - Stepdown/ICU TEAM   Justin Deleon ZOX:[id number] DOB: 1925-08-26 DOA: 04/16/2013 PCP: Kimber Relic, MD  Brief narrative: 79 y/o with dementia,  mod to severe Aortic stenosis, prostate CA s/p prostatectomy, HTN, GERD, PVD and HTN. He lives at [facility]. He went to see Dr Chilton Si on 10/7 and was found to have heme + stools. Dr Chilton Si notes Hgb dropped from 12 to 8 and sent him to see GI (Dr Daphine Deutscher) who did a colonoscopy in 2009. He check a Hgb and noted it was down to 6 and sent him to the ER.   Assessment/Plan: Principal Problem:   Anemia/  GI bleed - with heme- occult pos stools - oddly Hgb did not change much after 2 U prbc therefore 2 more ordered - f/u on w/u for hemolytic anemia and anemia panel   Active Problems:     Elevated troponin - due to anemia?  Cards following   Aortic stenosis - careful with transfusions to prevent heart failure ECHO 09/2011:  Conclusions: 1. Moderate to severe aortic valve stenosis. Peak velocity 3.56 m/sec  2. There is mild aortic root dilatation.  3. Mild left atrial enlargement.  4. Left ventricular ejection fraction estimated by 2D at 55-60 percent.  5. There is mild concentric left ventricle hypertrophy.  6. Analysis of mitral valve inflow, pulmonary vein Doppler and tissue Doppler suggests grade I  diastolic dysfunction without elevated left atrial pressure.  7. Trace of pulmonic valve regurgitation.  8. There is mild tricuspid regurgitation.     Prostate cancer - s/p prostatectomy     Hypertension - very hypotensive    Peripheral vascular disease, unspecified    Hypertrophy of prostate with urinary obstruction and other lower urinary tract symptoms (LUTS)   ARF  - previously GFR > 60 and now in high 40s  Code Status: DNR Family Communication: none Disposition Plan: follow in SDU until hgb stable  Consultants: GI  Procedures: none  Antibiotics: none  DVT  prophylaxis: SCD  HPI/Subjective: Pt alert and stating that he has never noticed any blood in his stools. No abd pain.   Objective: Blood pressure 91/53, pulse 68, temperature 97.8 F (36.6 C), temperature source Oral, resp. rate 20, height 5\' 10"  (1.778 m), weight 82.1 kg (181 lb), SpO2 96.00%.  Intake/Output Summary (Last 24 hours) at 04/17/13 1224 Last data filed at 04/17/13 1145  Gross per 24 hour  Intake 2941.75 ml  Output    925 ml  Net 2016.75 ml     Exam: General: No acute respiratory distress Lungs: Clear to auscultation bilaterally without wheezes or crackles Cardiovascular: Regular rate and rhythm 3/6 murmur  Abdomen: Nontender, nondistended, soft, bowel sounds positive, no rebound, no ascites, no appreciable mass Extremities: No significant cyanosis, clubbing, or edema bilateral lower extremities  Data Reviewed: Basic Metabolic Panel:  Recent Labs Lab 04/16/13 1832 04/17/13 0825  NA 136 137  K 4.5 4.0  CL 101 105  CO2 24 23  GLUCOSE 115* 97  BUN 28* 23  CREATININE 1.36* 1.29  CALCIUM 9.0 8.5   Liver Function Tests:  Recent Labs Lab 04/16/13 1832  AST 25  ALT 20  ALKPHOS 45  BILITOT 0.3  PROT 6.2  ALBUMIN 3.6   No results found for this basename: LIPASE, AMYLASE,  in the last 168 hours No results found for this basename: AMMONIA,  in the last 168 hours CBC:  Recent Labs Lab 04/16/13 1832 04/17/13  0450  WBC 8.9 8.0  NEUTROABS 7.0  --   HGB 6.1* 6.8*  HCT 19.4* 21.0*  MCV 79.5 79.8  PLT 427* 351   Cardiac Enzymes:  Recent Labs Lab 04/16/13 2020 04/17/13 0210 04/17/13 0825  CKTOTAL  --  256* 291*  CKMB  --  41.2* 49.7*  TROPONINI 0.89* 4.33* 3.26*   BNP (last 3 results)  Recent Labs  04/17/13 0825  PROBNP 4224.0*   CBG: No results found for this basename: GLUCAP,  in the last 168 hours  Recent Results (from the past 240 hour(s))  MRSA PCR SCREENING     Status: None   Collection Time    04/16/13 10:13 PM      Result  Value Range Status   MRSA by PCR NEGATIVE  NEGATIVE Final   Comment:            The GeneXpert MRSA Assay (FDA     approved for NASAL specimens     only), is one component of a     comprehensive MRSA colonization     surveillance program. It is not     intended to diagnose MRSA     infection nor to guide or     monitor treatment for     MRSA infections.     Studies:  Recent x-ray studies have been reviewed in detail by the Attending Physician  Scheduled Meds:  Scheduled Meds: . buPROPion  300 mg Oral BH-q7a  . cholecalciferol  3,000 Units Oral Daily  . docusate sodium  300 mg Oral Daily  . gabapentin  1,500 mg Oral QHS  . multivitamin-lutein  2 capsule Oral Daily  . [START ON 04/20/2013] pantoprazole (PROTONIX) IV  40 mg Intravenous Q12H  . polyethylene glycol  17 g Oral Daily  . senna-docusate  3 tablet Oral Daily  . sodium chloride  3 mL Intravenous Q12H  . tamsulosin  0.4 mg Oral QPC breakfast  . vitamin C  1,000 mg Oral BID  . vitamin E  1,000 Units Oral Daily   Continuous Infusions: . sodium chloride      Time spent on care of this patient: 35 min   Calvert Cantor, MD  [facility] Office  [phone] Pager - Text Page per Loretha Stapler as per below:  On-Call/Text Page:      [email or web address]      password [email or web address]  If 7PM-7AM, please contact night-coverage [email or web address] Password [email or web address] 04/17/2013, 12:24 PM   LOS: 1 day

## 2013-04-17 NOTE — Progress Notes (Signed)
Pt passes urine hourly and had 5 lightly blood streaked bowel movements today. Alert X 4 with poor short term memory. Family updated on plan of care today. Pt remains high fall risk.

## 2013-04-17 NOTE — Plan of Care (Signed)
Hgb has increased from 6.1 to 6.8 after 2 units PRBCs. Discussed with RN- only streaks of blood noted in BSC. Not sure if equilibrating or possibly hemolyzing so will check LDH and haptoglobin; will also transfuse 2 more units PRBCs  Junious Silk, ANP

## 2013-04-17 NOTE — Progress Notes (Addendum)
Patient Name: CASSANDRA MCMANAMAN Date of Encounter: 04/17/2013    SUBJECTIVE: Last seen 1.5 years ago at which time we decided against surgical AVR but left the option for TAVR open to discuss if symptoms. At last cath the AS was moderate to severe. Admitted now for severe anemia and elevated troponin. For the 2 weeks prior to admission, the patient has had exertional interscapular pain. It is relieved by rest. Considerable discussion with the patient and son this morning concerning cardiac issues.  TELEMETRY:  NSR: Filed Vitals:   04/17/13 0130 04/17/13 0338 04/17/13 0400 04/17/13 0500  BP: 90/46  93/58   Pulse: 66 68 64   Temp: 97.9 F (36.6 C) 97.7 F (36.5 C)    TempSrc: Oral Oral    Resp: 14 15    Height:      Weight:    181 lb (82.1 kg)  SpO2: 98% 97% 95%     Intake/Output Summary (Last 24 hours) at 04/17/13 0725 Last data filed at 04/17/13 0600  Gross per 24 hour  Intake 1691.75 ml  Output    225 ml  Net 1466.75 ml    LABS: Basic Metabolic Panel:  Recent Labs  32/44/01 1832  NA 136  K 4.5  CL 101  CO2 24  GLUCOSE 115*  BUN 28*  CREATININE 1.36*  CALCIUM 9.0   CBC:  Recent Labs  04/16/13 1832 04/17/13 0450  WBC 8.9 8.0  NEUTROABS 7.0  --   HGB 6.1* 6.8*  HCT 19.4* 21.0*  MCV 79.5 79.8  PLT 427* 351   Cardiac Enzymes:  Recent Labs  04/16/13 2020 04/17/13 0210  CKTOTAL  --  256*  CKMB  --  41.2*  TROPONINI 0.89* 4.33*   ECHO 09/2011: Conclusions: 1. Moderate to severe aortic valve stenosis. Peak velocity 3.56 m/sec 2. There is mild aortic root dilatation. 3. Mild left atrial enlargement. 4. Left ventricular ejection fraction estimated by 2D at 55-60 percent. 5. There is mild concentric left ventricle hypertrophy. 6. Analysis of mitral valve inflow, pulmonary vein Doppler and tissue Doppler suggests grade I diastolic dysfunction without elevated left atrial pressure. 7. Trace of pulmonic valve regurgitation. 8. There is mild tricuspid  regurgitation.  Radiology/Studies:  Pleural effusion on CXR  Physical Exam: Blood pressure 93/58, pulse 64, temperature 97.7 F (36.5 C), temperature source Oral, resp. rate 15, height 5\' 10"  (1.778 m), weight 181 lb (82.1 kg), SpO2 95.00%. Weight change:    4/6 systolic murmur of aortic stenosis. No regurgitation, murmurs heard. Neck veins not distended Chest is clear anteriorly  ASSESSMENT:  1. Severe anemia, likely GI blood loss from either gastric sores or colonic AV malformations (associated with AS) 2. Exertional angina expressed as interscapular pain. This is likely a consequence of severe anemia in the setting of aortic stenosis, plus or minus coronary artery disease. 3. Elevated troponin and CK markers, likely secondary to severe anemia in the setting of AS and CAD. 4. Acute diastolic heart failure, secondary to anemia, in the setting of significant AS.  Plan:  1. No specific cardiac evaluation for elevated markers. As the patient's hemoglobin improves hopefully, cardiac symptoms, will resolve.  2. Check BNP, and if transfusions are planned, please diurese to avoid acute heart failure. Would not require chronic diuretic therapy once hemoglobin is above 8.5  3. Dr. Mayford Knife has ordered an echocardiogram, whatever it demonstrates will not change the conservative approach or cause any further cardiac evaluation at this time.  Selinda Eon 04/17/2013,  7:25 AM

## 2013-04-17 NOTE — Progress Notes (Signed)
Utilization review completed. Ulysess Witz, RN, BSN. 

## 2013-04-17 NOTE — Progress Notes (Signed)
CRITICAL VALUE ALERT  Critical value received:  CKMB 41.2 and troponin 4.33  Date of notification:  04/17/2013  Time of notification:  0415  Critical value read back:yes  Nurse who received alert:  Lillia Corporal  MD notified (1st page):  L. Easterwood NP  Time of first page:  0420  MD notified (2nd page):  Time of second page:  Responding MD:  Coralie Carpen NP  Time MD responded:  [phone]

## 2013-04-17 NOTE — Progress Notes (Signed)
  Echocardiogram 2D Echocardiogram has been performed.  Justin Deleon FRANCES 04/17/2013, 10:41 AM

## 2013-04-18 LAB — CBC
HCT: 25.7 % — ABNORMAL LOW (ref 39.0–52.0)
HCT: 26.6 % — ABNORMAL LOW (ref 39.0–52.0)
Hemoglobin: 9.1 g/dL — ABNORMAL LOW (ref 13.0–17.0)
Hemoglobin: 8.4 g/dL — ABNORMAL LOW (ref 13.0–17.0)
MCH: 26.2 pg (ref 26.0–34.0)
MCH: 26.1 pg (ref 26.0–34.0)
MCHC: 32.7 g/dL (ref 30.0–36.0)
MCV: 79.9 fL (ref 78.0–100.0)
Platelets: 352 10*3/uL (ref 150–400)
Platelets: 352 10*3/uL (ref 150–400)
Platelets: 339 10*3/uL (ref 150–400)
RBC: 3.48 MIL/uL — ABNORMAL LOW (ref 4.22–5.81)
RBC: 3.33 MIL/uL — ABNORMAL LOW (ref 4.22–5.81)
WBC: 11.1 10*3/uL — ABNORMAL HIGH (ref 4.0–10.5)
WBC: 10.5 10*3/uL (ref 4.0–10.5)

## 2013-04-18 LAB — TYPE AND SCREEN
ABO/RH(D): O POS
Antibody Screen: NEGATIVE
Unit division: 0
Unit division: 0

## 2013-04-18 LAB — BASIC METABOLIC PANEL
CO2: 23 mEq/L (ref 19–32)
Calcium: 8.5 mg/dL (ref 8.4–10.5)
Chloride: 105 mEq/L (ref 96–112)
Creatinine, Ser: 1.28 mg/dL (ref 0.50–1.35)
GFR calc non Af Amer: 49 mL/min — ABNORMAL LOW (ref >90)

## 2013-04-18 LAB — FOLATE: Folate: 16.4 ng/mL

## 2013-04-18 LAB — FERRITIN: Ferritin: 8 ng/mL — ABNORMAL LOW (ref 22–322)

## 2013-04-18 LAB — VITAMIN B12: Vitamin B-12: 745 pg/mL (ref 211–911)

## 2013-04-18 MED ORDER — SODIUM CHLORIDE 0.9 % IV SOLN
25.0000 mg | Freq: Once | INTRAVENOUS | Status: AC
  Administered 2013-04-18: 25 mg via INTRAVENOUS
  Filled 2013-04-18: qty 0.5

## 2013-04-18 MED ORDER — IRON DEXTRAN 50 MG/ML IJ SOLN
500.0000 mg | INTRAMUSCULAR | Status: DC
  Filled 2013-04-18: qty 10

## 2013-04-18 MED ORDER — SODIUM CHLORIDE 0.9 % IV SOLN
500.0000 mg | Freq: Once | INTRAVENOUS | Status: AC
  Administered 2013-04-18: 500 mg via INTRAVENOUS
  Filled 2013-04-18: qty 10

## 2013-04-18 NOTE — Progress Notes (Signed)
Patient Name: Justin Deleon Date of Encounter: 04/18/2013    SUBJECTIVE: Now with  Severe  AS, by most recent echo. Admitted now for severe anemia and elevated troponin. For the 2 weeks prior to admission, the patient has had exertional interscapular pain. Prior to this, he was exercising 3x/week without problems. TELEMETRY:  NSR: Filed Vitals:   04/18/13 0007 04/18/13 0100 04/18/13 0419 04/18/13 0752  BP: 93/51 103/45 105/70 108/48  Pulse: 62 63 74 67  Temp: 97.8 F (36.6 C)  97.9 F (36.6 C) 97.4 F (36.3 C)  TempSrc: Oral  Oral Oral  Resp: 14 14 20 15   Height:      Weight:      SpO2: 92% 94% 90% 100%    Intake/Output Summary (Last 24 hours) at 04/18/13 1010 Last data filed at 04/18/13 0900  Gross per 24 hour  Intake   1463 ml  Output   1225 ml  Net    238 ml    LABS: Basic Metabolic Panel:  Recent Labs  11/91/47 1832 04/17/13 0825  NA 136 137  K 4.5 4.0  CL 101 105  CO2 24 23  GLUCOSE 115* 97  BUN 28* 23  CREATININE 1.36* 1.29  CALCIUM 9.0 8.5   CBC:  Recent Labs  04/16/13 1832  04/17/13 2315 04/18/13 0545  WBC 8.9  < > 11.7* 12.3*  NEUTROABS 7.0  --   --   --   HGB 6.1*  < > 8.9* 9.1*  HCT 19.4*  < > 27.1* 27.9*  MCV 79.5  < > 80.9 80.2  PLT 427*  < > 318 352  < > = values in this interval not displayed. Cardiac Enzymes:  Recent Labs  04/16/13 2020 04/17/13 0210 04/17/13 0825  CKTOTAL  --  256* 291*  CKMB  --  41.2* 49.7*  TROPONINI 0.89* 4.33* 3.26*   ECHO 10/14 Conclusions: 1. Severe aortic valve stenosis.   Left ventricular ejection fraction estimated by 2D at 55-60 percent. Apical hypokinesis  Radiology/Studies:  Pleural effusion on CXR  Physical Exam: Blood pressure 108/48, pulse 67, temperature 97.4 F (36.3 C), temperature source Oral, resp. rate 15, height 5\' 10"  (1.778 m), weight 181 lb (82.1 kg), SpO2 100.00%. Weight change:    4/6 systolic murmur of aortic stenosis. No regurgitation, murmurs heard. Neck veins not  distended Chest is clear anteriorly No edema  ASSESSMENT:  1. Severe anemia, likely GI blood loss from either gastric sores or colonic AV malformations (associated with AS) 2. Exertional angina expressed as interscapular pain. This is likely a consequence of severe anemia in the setting of aortic stenosis, plus or minus coronary artery disease. 3. Elevated troponin and CK markers, likely secondary to severe anemia in the setting of AS and CAD. 4. Acute diastolic heart failure, secondary to anemia, in the setting of significant AS.  Plan:  1. No specific cardiac evaluation for elevated markers. As the patient's hemoglobin improves hopefully, cardiac symptoms, will resolve.  2. Check BNP, and if transfusions are planned, please diurese to avoid acute heart failure. Would not require chronic diuretic therapy once hemoglobin is above 8.5  3. Reconsider TAVR if patient has sx while Hbg in the normal range.  Signed, Lance Muss S. 04/18/2013, 10:10 AM

## 2013-04-18 NOTE — Progress Notes (Signed)
Patient resides at [facility].  Current plan is for return to independent living. MD: please consider PT Eval if you think appropriate to insure that patient does not require a higher level of care.  Many thanks! Lorri Frederick. [place]  [phone]

## 2013-04-18 NOTE — Progress Notes (Signed)
I reviewed the EGD results with the patient. We have no specific source of the upper endoscopy to explain his anemia. We discussed colonoscopy. We will consider this for next week if cardiology feels he is stable to have this done.

## 2013-04-18 NOTE — Progress Notes (Addendum)
[facility] Progress Note [place] TEAM 1 - Stepdown/ICU TEAM   Justin Deleon ZOX:[id number] DOB: 03-13-1926 DOA: 04/16/2013 PCP: Kimber Relic, MD  Brief narrative: 79 y/o with dementia,  mod to severe Aortic stenosis, prostate CA s/p prostatectomy, HTN, GERD, PVD and HTN. He lives at [facility]. He went to see Dr Chilton Si on 10/7 and was found to have heme + stools. Dr Chilton Si notes Hgb dropped from 12 to 8 and sent him to see GI (Dr Daphine Deutscher) who did a colonoscopy in 2009. He check a Hgb and noted it was down to 6 and sent him to the ER.   Assessment/Plan: Principal Problem:   Anemia/  GI bleed - with heme- occult pos stools and reddish blood streaks  - oddly Hgb did not change much after 2 U prbc therefore 2 more ordered -anemia panel reveals low ferritin- will give IV Iron - Hgb up to 9.1 today - EGD negative for bleed- will need colonoscopy  Active Problems:     Elevated troponin - due to anemia?  Cards following   Aortic stenosis - careful with transfusions to prevent heart failure - currently stable  ECHO 09/2011:  Conclusions: 1. Moderate to severe aortic valve stenosis. Peak velocity 3.56 m/sec  2. There is mild aortic root dilatation.  3. Mild left atrial enlargement.  4. Left ventricular ejection fraction estimated by 2D at 55-60 percent.  5. There is mild concentric left ventricle hypertrophy.  6. Analysis of mitral valve inflow, pulmonary vein Doppler and tissue Doppler suggests grade I  diastolic dysfunction without elevated left atrial pressure.  7. Trace of pulmonic valve regurgitation.  8. There is mild tricuspid regurgitation.   ECHO 10/10 Left ventricle: The cavity size was normal. There was mild focal basal hypertrophy of the septum. Systolic function was normal. The estimated ejection fraction was in the range of 55% to 60%. Possible hypokinesis of the apical myocardium. - Aortic valve: There was severe stenosis. Mild regurgitation. Valve  area: 0.63cm^2(VTI). Valve area: 0.57cm^2 (Vmax). - Mitral valve: Calcified annulus. Moderate regurgitation. - Left atrium: The atrium was mildly to moderately dilated. - Right atrium: The atrium was mildly dilated. - Pulmonary arteries: Systolic pressure was severely increased. PA peak pressure: 71mm Hg (S).   ARF  - previously GFR > 60 and now in high 40s -follow    Prostate cancer - s/p prostatectomy     Hypertension - hypotensive currently    Peripheral vascular disease, unspecified    Hypertrophy of prostate with urinary obstruction and other lower urinary tract symptoms (LUTS)    Code Status: DNR Family Communication: none Disposition Plan: follow in SDU until hgb stable  Consultants: GI  Procedures: none  Antibiotics: none  DVT prophylaxis: SCD  HPI/Subjective: Pt alert and stating that he is feeling "much better" although he also states he was not feeling bad prior to admission. No dyspnea after blood transfusions. RN continues to note blood streaks in stools.    Objective: Blood pressure 108/48, pulse 67, temperature 97.4 F (36.3 C), temperature source Oral, resp. rate 15, height 5\' 10"  (1.778 m), weight 82.1 kg (181 lb), SpO2 100.00%.  Intake/Output Summary (Last 24 hours) at 04/18/13 0800 Last data filed at 04/18/13 0640  Gross per 24 hour  Intake   2243 ml  Output   1525 ml  Net    718 ml     Exam: General: No acute respiratory distress Lungs: faint crackles at bases- 1005 on room air Cardiovascular:  Regular rate and rhythm 2/6 murmur aortic Abdomen: Nontender, nondistended, soft, bowel sounds positive, no rebound, no ascites, peri-umbilical hernia Extremities: No significant cyanosis, clubbing, or edema bilateral lower extremities  Data Reviewed: Basic Metabolic Panel:  Recent Labs Lab 04/16/13 1832 04/17/13 0825  NA 136 137  K 4.5 4.0  CL 101 105  CO2 24 23  GLUCOSE 115* 97  BUN 28* 23  CREATININE 1.36* 1.29  CALCIUM 9.0 8.5    Liver Function Tests:  Recent Labs Lab 04/16/13 1832  AST 25  ALT 20  ALKPHOS 45  BILITOT 0.3  PROT 6.2  ALBUMIN 3.6   No results found for this basename: LIPASE, AMYLASE,  in the last 168 hours No results found for this basename: AMMONIA,  in the last 168 hours CBC:  Recent Labs Lab 04/16/13 1832 04/17/13 0450 04/17/13 1400 04/17/13 2315 04/18/13 0545  WBC 8.9 8.0 8.6 11.7* 12.3*  NEUTROABS 7.0  --   --   --   --   HGB 6.1* 6.8* 8.7* 8.9* 9.1*  HCT 19.4* 21.0* 27.1* 27.1* 27.9*  MCV 79.5 79.8 80.7 80.9 80.2  PLT 427* 351 309 318 352   Cardiac Enzymes:  Recent Labs Lab 04/16/13 2020 04/17/13 0210 04/17/13 0825  CKTOTAL  --  256* 291*  CKMB  --  41.2* 49.7*  TROPONINI 0.89* 4.33* 3.26*   BNP (last 3 results)  Recent Labs  04/17/13 0825  PROBNP 4224.0*   CBG: No results found for this basename: GLUCAP,  in the last 168 hours  Recent Results (from the past 240 hour(s))  MRSA PCR SCREENING     Status: None   Collection Time    04/16/13 10:13 PM      Result Value Range Status   MRSA by PCR NEGATIVE  NEGATIVE Final   Comment:            The GeneXpert MRSA Assay (FDA     approved for NASAL specimens     only), is one component of a     comprehensive MRSA colonization     surveillance program. It is not     intended to diagnose MRSA     infection nor to guide or     monitor treatment for     MRSA infections.     Studies:  Recent x-ray studies have been reviewed in detail by the Attending Physician  Scheduled Meds:  Scheduled Meds: . buPROPion  300 mg Oral BH-q7a  . cholecalciferol  3,000 Units Oral Daily  . docusate sodium  300 mg Oral Daily  . gabapentin  1,500 mg Oral QHS  . multivitamin-lutein  2 capsule Oral Daily  . [START ON 04/20/2013] pantoprazole (PROTONIX) IV  40 mg Intravenous Q12H  . polyethylene glycol  17 g Oral Daily  . senna-docusate  3 tablet Oral Daily  . sodium chloride  3 mL Intravenous Q12H  . tamsulosin  0.4 mg  Oral QPC breakfast  . vitamin C  1,000 mg Oral BID  . vitamin E  1,000 Units Oral Daily   Continuous Infusions:    Time spent on care of this patient: 35 min   Calvert Cantor, MD  [facility] Office  [phone] Pager - Text Page per Loretha Stapler as per below:  On-Call/Text Page:      [email or web address]      password [email or web address]  If 7PM-7AM, please contact night-coverage [email or web address] Password [email or web address] 04/18/2013, 8:00 AM   LOS: 2 days

## 2013-04-19 DIAGNOSIS — I959 Hypotension, unspecified: Secondary | ICD-10-CM

## 2013-04-19 LAB — BASIC METABOLIC PANEL
BUN: 14 mg/dL (ref 6–23)
CO2: 18 mEq/L — ABNORMAL LOW (ref 19–32)
Calcium: 8.7 mg/dL (ref 8.4–10.5)
Chloride: 102 mEq/L (ref 96–112)
Creatinine, Ser: 1.1 mg/dL (ref 0.50–1.35)
GFR calc non Af Amer: 59 mL/min — ABNORMAL LOW (ref >90)
Glucose, Bld: 72 mg/dL (ref 70–99)

## 2013-04-19 LAB — CBC
HCT: 28.2 % — ABNORMAL LOW (ref 39.0–52.0)
MCH: 25.9 pg — ABNORMAL LOW (ref 26.0–34.0)
MCHC: 31.9 g/dL (ref 30.0–36.0)
MCV: 81 fL (ref 78.0–100.0)
Platelets: 381 10*3/uL (ref 150–400)
RDW: 16.7 % — ABNORMAL HIGH (ref 11.5–15.5)
WBC: 10.7 10*3/uL — ABNORMAL HIGH (ref 4.0–10.5)

## 2013-04-19 MED ORDER — SODIUM CHLORIDE 0.9 % IV SOLN
500.0000 mg | Freq: Once | INTRAVENOUS | Status: AC
  Administered 2013-04-19: 12:00:00 500 mg via INTRAVENOUS
  Filled 2013-04-19: qty 10

## 2013-04-19 NOTE — Progress Notes (Signed)
The patient is feeling better today and will be transferred out of the cardiac unit today. We will plan colonoscopy in a couple of days.

## 2013-04-19 NOTE — Progress Notes (Signed)
[facility] Progress Note [place] TEAM 1 - Stepdown/ICU TEAM   Justin Deleon ZOX:[id number] DOB: [birth year] DOA: 04/16/2013 PCP: Kimber Relic, MD  Brief narrative: 79 y/o with dementia,  mod to severe Aortic stenosis, prostate CA s/p prostatectomy, HTN, GERD, PVD and HTN. He lives at [facility]. He went to see Dr Chilton Si on 10/7 and was found to have heme + stools. Dr Chilton Si notes Hgb dropped from 12 to 8 and sent him to see GI (Dr Daphine Deutscher) who did a colonoscopy in 2009. He check a Hgb and noted it was down to 6 and sent him to the ER.   Assessment/Plan: Principal Problem:   Anemia/  GI bleed - with heme- occult pos stools and reddish blood streaks - blood streaks resolved today - presented with Hgb of 6.1 - oddly Hgb did not change much after 2 U prbc therefore 2 more ordered -anemia panel reveals low ferritin-  giving IV Iron x 2 days- will nee to d/c with oral iron - Hgb improved to 8-9 and stable - EGD negative for bleed- will need colonoscopy  Active Problems:     Elevated troponin - due to demand from anemia?  Cards following   Aortic stenosis - careful with transfusions to prevent heart failure - currently stable  ECHO 09/2011:  Conclusions: 1. Moderate to severe aortic valve stenosis. Peak velocity 3.56 m/sec  2. There is mild aortic root dilatation.  3. Mild left atrial enlargement.  4. Left ventricular ejection fraction estimated by 2D at 55-60 percent.  5. There is mild concentric left ventricle hypertrophy.  6. Analysis of mitral valve inflow, pulmonary vein Doppler and tissue Doppler suggests grade I  diastolic dysfunction without elevated left atrial pressure.  7. Trace of pulmonic valve regurgitation.  8. There is mild tricuspid regurgitation.   ECHO 10/10 Left ventricle: The cavity size was normal. There was mild focal basal hypertrophy of the septum. Systolic function was normal. The estimated ejection fraction was in the range of 55% to  60%. Possible hypokinesis of the apical myocardium. - Aortic valve: There was severe stenosis. Mild regurgitation. Valve area: 0.63cm^2(VTI). Valve area: 0.57cm^2 (Vmax). - Mitral valve: Calcified annulus. Moderate regurgitation. - Left atrium: The atrium was mildly to moderately dilated. - Right atrium: The atrium was mildly dilated. - Pulmonary arteries: Systolic pressure was severely increased. PA peak pressure: 71mm Hg (S).   ARF  - previously GFR > 60- in high 40s on admission and improving -follow    Prostate cancer - s/p prostatectomy     Hypertension - hypotensive currently- holding B blocker-  Need adequate preload dur to Aortic stenosis    Peripheral vascular disease, unspecified    Hypertrophy of prostate with urinary obstruction and other lower urinary tract symptoms (LUTS)    Code Status: DNR Family Communication: none Disposition Plan: transfer to tele today- PT eval, ambulate   Consultants: GI  Procedures: none  Antibiotics: none  DVT prophylaxis: SCD  HPI/Subjective: Pt alert and has no complaints. Thinks he has been in the hospital for 6 days. No blood in stool this morning.    Objective: Blood pressure 108/45, pulse 78, temperature 97.7 F (36.5 C), temperature source Oral, resp. rate 17, height 5\' 10"  (1.778 m), weight 80.7 kg (177 lb 14.6 oz), SpO2 93.00%.  Intake/Output Summary (Last 24 hours) at 04/19/13 1003 Last data filed at 04/18/13 1137  Gross per 24 hour  Intake    310 ml  Output  0 ml  Net    310 ml     Exam: General: No acute respiratory distress Lungs: faint crackles at bases- 100% on room air Cardiovascular: Regular rate and rhythm 2/6 murmur aortic Abdomen: Nontender, nondistended, soft, bowel sounds positive, no rebound, no ascites, peri-umbilical hernia Extremities: No significant cyanosis, clubbing, or edema bilateral lower extremities  Data Reviewed: Basic Metabolic Panel:  Recent Labs Lab 04/16/13 1832  04/17/13 0825 04/18/13 0545 04/19/13 0540  NA 136 137 137 137  K 4.5 4.0 3.9 3.9  CL 101 105 105 102  CO2 24 23 23  18*  GLUCOSE 115* 97 87 72  BUN 28* 23 21 14   CREATININE 1.36* 1.29 1.28 1.10  CALCIUM 9.0 8.5 8.5 8.7   Liver Function Tests:  Recent Labs Lab 04/16/13 1832  AST 25  ALT 20  ALKPHOS 45  BILITOT 0.3  PROT 6.2  ALBUMIN 3.6   No results found for this basename: LIPASE, AMYLASE,  in the last 168 hours No results found for this basename: AMMONIA,  in the last 168 hours CBC:  Recent Labs Lab 04/16/13 1832  04/17/13 2315 04/18/13 0545 04/18/13 1350 04/18/13 2210 04/19/13 0540  WBC 8.9  < > 11.7* 12.3* 11.1* 10.5 10.7*  NEUTROABS 7.0  --   --   --   --   --   --   HGB 6.1*  < > 8.9* 9.1* 8.4* 8.7* 9.0*  HCT 19.4*  < > 27.1* 27.9* 25.7* 26.6* 28.2*  MCV 79.5  < > 80.9 80.2 80.1 79.9 81.0  PLT 427*  < > 318 352 352 339 381  < > = values in this interval not displayed. Cardiac Enzymes:  Recent Labs Lab 04/16/13 2020 04/17/13 0210 04/17/13 0825  CKTOTAL  --  256* 291*  CKMB  --  41.2* 49.7*  TROPONINI 0.89* 4.33* 3.26*   BNP (last 3 results)  Recent Labs  04/17/13 0825  PROBNP 4224.0*   CBG: No results found for this basename: GLUCAP,  in the last 168 hours  Recent Results (from the past 240 hour(s))  MRSA PCR SCREENING     Status: None   Collection Time    04/16/13 10:13 PM      Result Value Range Status   MRSA by PCR NEGATIVE  NEGATIVE Final   Comment:            The GeneXpert MRSA Assay (FDA     approved for NASAL specimens     only), is one component of a     comprehensive MRSA colonization     surveillance program. It is not     intended to diagnose MRSA     infection nor to guide or     monitor treatment for     MRSA infections.     Studies:  Recent x-ray studies have been reviewed in detail by the Attending Physician  Scheduled Meds:  Scheduled Meds: . buPROPion  300 mg Oral BH-q7a  . cholecalciferol  3,000 Units Oral  Daily  . docusate sodium  300 mg Oral Daily  . gabapentin  1,500 mg Oral QHS  . iron dextran (INFED/DEXFERRUM) infusion  500 mg Intravenous Once  . multivitamin-lutein  2 capsule Oral Daily  . polyethylene glycol  17 g Oral Daily  . senna-docusate  3 tablet Oral Daily  . sodium chloride  3 mL Intravenous Q12H  . tamsulosin  0.4 mg Oral QPC breakfast  . vitamin C  1,000 mg Oral BID  .  vitamin E  1,000 Units Oral Daily   Continuous Infusions:    Time spent on care of this patient: 35 min   Calvert Cantor, MD  [facility] Office  [phone] Pager - Text Page per Loretha Stapler as per below:  On-Call/Text Page:      [email or web address]      password [email or web address]  If 7PM-7AM, please contact night-coverage [email or web address] Password [email or web address] 04/19/2013, 10:03 AM   LOS: 3 days

## 2013-04-19 NOTE — Progress Notes (Signed)
Patient ID: Justin Deleon, male   DOB: 21-Sep-1925, 79 y.o.   MRN: [id number]     Primary cardiologist:  Subjective:   No chest pain or SOB overnight   Objective:   Temp:  [97.7 F (36.5 C)-98 F (36.7 C)] 97.7 F (36.5 C) (10/12 0739) Pulse Rate:  [69-83] 78 (10/12 0739) Resp:  [13-22] 17 (10/12 0739) BP: (90-117)/(45-77) 108/45 mmHg (10/12 0739) SpO2:  [93 %-100 %] 93 % (10/12 0739) Weight:  [177 lb 14.6 oz (80.7 kg)] 177 lb 14.6 oz (80.7 kg) (10/12 0500) Last BM Date: 04/17/13  Filed Weights   04/16/13 2207 04/17/13 0500 04/19/13 0500  Weight: 181 lb (82.1 kg) 181 lb (82.1 kg) 177 lb 14.6 oz (80.7 kg)    Intake/Output Summary (Last 24 hours) at 04/19/13 0830 Last data filed at 04/18/13 1137  Gross per 24 hour  Intake    430 ml  Output      0 ml  Net    430 ml    Telemetry:SR with occas PVCs  Exam:  General:NAD  Resp:CTAB  Cardiac:RRR, 3/6 mid to late peaking systolic murmur RUSB, no JVD  WU:JWJXBJY soft, NT, ND  NWG:NFAOZHYQMVH are warm, no edema  Neuro: no focal deficits  Psych: appropriate affect  Lab Results:  Basic Metabolic Panel:  Recent Labs Lab 04/16/13 1832 04/17/13 0825 04/18/13 0545  NA 136 137 137  K 4.5 4.0 3.9  CL 101 105 105  CO2 24 23 23   GLUCOSE 115* 97 87  BUN 28* 23 21  CREATININE 1.36* 1.29 1.28  CALCIUM 9.0 8.5 8.5    Liver Function Tests:  Recent Labs Lab 04/16/13 1832  AST 25  ALT 20  ALKPHOS 45  BILITOT 0.3  PROT 6.2  ALBUMIN 3.6    CBC:  Recent Labs Lab 04/18/13 1350 04/18/13 2210 04/19/13 0540  WBC 11.1* 10.5 10.7*  HGB 8.4* 8.7* 9.0*  HCT 25.7* 26.6* 28.2*  MCV 80.1 79.9 81.0  PLT 352 339 381    Cardiac Enzymes:  Recent Labs Lab 04/16/13 2020 04/17/13 0210 04/17/13 0825  CKTOTAL  --  256* 291*  CKMB  --  41.2* 49.7*  TROPONINI 0.89* 4.33* 3.26*    BNP:  Recent Labs  04/17/13 0825  PROBNP 4224.0*    Coagulation:  Recent Labs Lab 04/16/13 1853  INR 1.01     ECG:   Medications:   Scheduled Medications: . buPROPion  300 mg Oral BH-q7a  . cholecalciferol  3,000 Units Oral Daily  . docusate sodium  300 mg Oral Daily  . gabapentin  1,500 mg Oral QHS  . iron dextran (INFED/DEXFERRUM) infusion  500 mg Intravenous Once  . multivitamin-lutein  2 capsule Oral Daily  . polyethylene glycol  17 g Oral Daily  . senna-docusate  3 tablet Oral Daily  . sodium chloride  3 mL Intravenous Q12H  . tamsulosin  0.4 mg Oral QPC breakfast  . vitamin C  1,000 mg Oral BID  . vitamin E  1,000 Units Oral Daily     Infusions:     PRN Medications:  acetaminophen, acetaminophen, menthol-cetylpyridinium, ondansetron (ZOFRAN) IV, ondansetron, polyvinyl alcohol, zolpidem   04/17/13 Echo: LVEF 55-60%, severe AS (AVA 0.63 by VTI, mean gradient 35) Mod MR, PASP 71  04/16/13: IMPRESSION:  1. Unremarkable bowel gas pattern; no free intra-abdominal air  seen.  2. Small left pleural effusion noted.  Assessment/Plan   79 yo male history of dementia, severe aortic stenosis, HTN, prostate CA s/p prostatectomy,  HTN admitted with anemia and heme + stools. Trop peaked 4.33, EKG with LVH and strain pattern without specific ischemic changes.   1. Troponin elevation - likely secondary increased demand due tosevere AS in the setting of severely compromised oxygen carrying capacity/anemia.  - continue GI workup for anemia - pending clinical course, may need to be reevaluated for TAVR once GI bleed issues resolved/stabilized - careful monitoring of volume status if further transfusions needed.  - low normal blood pressures, avoiding beta blockers     Dina Rich, M.D., F.A.C.C.

## 2013-04-19 NOTE — Evaluation (Signed)
Physical Therapy Evaluation Patient Details Name: Justin Deleon MRN: [id number] DOB: 02-22-1926 Today's Date: 04/19/2013 Time: 1191-4782 PT Time Calculation (min): 27 min  PT Assessment / Plan / Recommendation History of Present Illness  Patient is an 79 yo male admitted with anemia (Hgb 6.1).  Also noted new dx of severe AS.  Patient with h/o peripheral neuropathy, PVD, HTN.  Clinical Impression  Patient presents with problems listed below.  Will benefit from acute PT to maximize independence prior to discharge.  Patient close to baseline functional level.    PT Assessment  Patient needs continued PT services    Follow Up Recommendations  Supervision - Intermittent (Patient just completed PT for balance therapy)    Does the patient have the potential to tolerate intense rehabilitation      Barriers to Discharge Decreased caregiver support Lives alone.  Has attendant that helps him 2x/week.  Children in area as well.    Equipment Recommendations  None recommended by PT    Recommendations for Other Services     Frequency Min 3X/week    Precautions / Restrictions Precautions Precautions: Fall Restrictions Weight Bearing Restrictions: No   Pertinent Vitals/Pain       Mobility  Bed Mobility Bed Mobility: Supine to Sit;Sitting - Scoot to Edge of Bed;Sit to Supine Supine to Sit: 5: Supervision;With rails;HOB elevated Sitting - Scoot to Edge of Bed: 5: Supervision Sit to Supine: 5: Supervision;HOB elevated Details for Bed Mobility Assistance: No cues needed.  Assist for safety only. Transfers Transfers: Sit to Stand;Stand to Sit Sit to Stand: 4: Min guard;With upper extremity assist;From bed Stand to Sit: 4: Min guard;With upper extremity assist;To bed Details for Transfer Assistance: Patient uses safe technique.  Assist for balance/safety only. Ambulation/Gait Ambulation/Gait Assistance: 4: Min guard Ambulation Distance (Feet): 180 Feet Assistive device: Rolling  walker Ambulation/Gait Assistance Details: Patient ambulates with flexed posture, taking quick short steps.  Cues to slow pace and try to take longer steps.  Balance improved with use of RW. Gait Pattern: Step-through pattern;Decreased step length - right;Decreased step length - left;Shuffle;Trunk flexed    Exercises     PT Diagnosis: Difficulty walking;Abnormality of gait;Generalized weakness;Altered mental status  PT Problem List: Decreased strength;Decreased activity tolerance;Decreased balance;Decreased mobility;Decreased cognition PT Treatment Interventions: DME instruction;Gait training;Functional mobility training;Balance training;Patient/family education     PT Goals(Current goals can be found in the care plan section) Acute Rehab PT Goals Patient Stated Goal: To return home PT Goal Formulation: With patient Time For Goal Achievement: 04/26/13 Potential to Achieve Goals: Good  Visit Information  Last PT Received On: 04/19/13 Assistance Needed: +1 History of Present Illness: Patient is an 79 yo male admitted with anemia (Hgb 6.1).  Also noted new dx of severe AS.  Patient with h/o peripheral neuropathy, PVD, HTN.       Prior Functioning  Home Living Family/patient expects to be discharged to:: Other (Comment) (Independent Living Facility - [facility]) Living Arrangements: Alone Available Help at Discharge: Personal care attendant;Available PRN/intermittently;Family (Personal care attendant 2x/week; Cleaning lady weekly) Type of Home: Independent living facility Home Access: Level entry Home Layout: One level Home Equipment: Walker - 4 wheels;Shower seat Prior Function Level of Independence: Independent with assistive device(s);Needs assistance ADL's / Homemaking Assistance Needed: Housekeeping and meals, transportation Comments: Patient exercises 3x/week in exercise room at facility Communication Communication: [facility]    Cognition  Cognition Arousal/Alertness:  Awake/alert Behavior During Therapy: WFL for tasks assessed/performed Overall Cognitive Status: History of cognitive impairments - at  baseline Memory: Decreased short-term memory (Patient repeating himself in conversation)    Extremity/Trunk Assessment Upper Extremity Assessment Upper Extremity Assessment: Overall WFL for tasks assessed Lower Extremity Assessment Lower Extremity Assessment: Generalized weakness Cervical / Trunk Assessment Cervical / Trunk Assessment: Kyphotic   Balance Balance Balance Assessed: Yes Static Sitting Balance Static Sitting - Balance Support: No upper extremity supported;Feet supported Static Sitting - Level of Assistance: 5: Stand by assistance Static Sitting - Comment/# of Minutes: 8 Static Standing Balance Static Standing - Balance Support: No upper extremity supported Static Standing - Level of Assistance: 4: Min assist Static Standing - Comment/# of Minutes: Patient with increased sway in stance without UE support.  Improves with use of RW  End of Session PT - End of Session Equipment Utilized During Treatment: Gait belt Activity Tolerance: Patient limited by fatigue Patient left: in bed;with call bell/phone within reach Nurse Communication: Mobility status  GP     Vena Austria 04/19/2013, 4:23 PM Durenda Hurt. Renaldo Fiddler, [facility] Acute Rehab Services Pager [phone]

## 2013-04-20 ENCOUNTER — Telehealth: Payer: Self-pay | Admitting: Interventional Cardiology

## 2013-04-20 ENCOUNTER — Encounter (HOSPITAL_COMMUNITY): Payer: Self-pay | Admitting: Gastroenterology

## 2013-04-20 LAB — CBC
HCT: 25.9 % — ABNORMAL LOW (ref 39.0–52.0)
Hemoglobin: 8.4 g/dL — ABNORMAL LOW (ref 13.0–17.0)
MCH: 26.3 pg (ref 26.0–34.0)
MCHC: 32.4 g/dL (ref 30.0–36.0)
MCV: 80.9 fL (ref 78.0–100.0)
Platelets: 390 10*3/uL (ref 150–400)
RBC: 3.2 MIL/uL — ABNORMAL LOW (ref 4.22–5.81)
RDW: 16.8 % — ABNORMAL HIGH (ref 11.5–15.5)
WBC: 7.9 10*3/uL (ref 4.0–10.5)

## 2013-04-20 MED ORDER — PEG 3350-KCL-NA BICARB-NACL 420 G PO SOLR
4000.0000 mL | Freq: Once | ORAL | Status: AC
  Administered 2013-04-20: 15:00:00 4000 mL via ORAL
  Filled 2013-04-20: qty 4000

## 2013-04-20 MED ORDER — SODIUM CHLORIDE 0.9 % IV SOLN
INTRAVENOUS | Status: DC
  Administered 2013-04-21: 500 mL via INTRAVENOUS
  Administered 2013-04-21: via INTRAVENOUS

## 2013-04-20 NOTE — Progress Notes (Signed)
Discussed colonoscopy again with patient. Will plan to have this done tomorrow by Dr Ewing Schlein.

## 2013-04-20 NOTE — Progress Notes (Signed)
Pt tolerating golytely well.  Instructed to call for assist when ready to use [facility] and also when ready to get off it.  Pt verbalized understanding.  Will continue to monitor.  Amanda Pea, Charity fundraiser.

## 2013-04-20 NOTE — Progress Notes (Signed)
Physical Therapy Treatment Patient Details Name: Justin Deleon MRN: [id number] DOB: 07/19/1925 Today's Date: 04/20/2013 Time: 7829-5621 PT Time Calculation (min): 27 min  PT Assessment / Plan / Recommendation  History of Present Illness Patient is an 79 yo male admitted with anemia (Hgb 6.1).  Also noted new dx of severe AS.  Patient with h/o peripheral neuropathy, PVD, HTN.   PT Comments   Pt admitted with above. Pt currently with functional limitations due to continued balance issues.  Pt will benefit from skilled PT to increase their independence and safety with mobility to allow discharge to the venue listed below.   Follow Up Recommendations  Home health PT;Supervision - Intermittent                 Equipment Recommendations  None recommended by PT        Frequency Min 3X/week   Progress towards PT Goals Progress towards PT goals: Progressing toward goals  Plan Current plan remains appropriate    Precautions / Restrictions Precautions Precautions: Fall Restrictions Weight Bearing Restrictions: No   Pertinent Vitals/Pain VSS, no pain   Mobility  Bed Mobility Bed Mobility: Sit to Supine Supine to Sit: Not tested (comment) Sitting - Scoot to Edge of Bed: Not tested (comment) Sit to Supine: 4: Min guard Details for Bed Mobility Assistance: No cues needed.  Assist for safety only.   Transfers Transfers: Sit to Stand;Stand to Sit Sit to Stand: 4: Min guard;With upper extremity assist;From bed Stand to Sit: 4: Min guard;With upper extremity assist;To bed Details for Transfer Assistance: Patient uses safe technique.  Assist for balance/safety only..  Pt ambulated to bathroom and performed his own hygeine with supervision.  Also brushed his teeth.   Ambulation/Gait Ambulation/Gait Assistance: 4: Min guard Ambulation Distance (Feet): 250 Feet Assistive device: Rolling walker Ambulation/Gait Assistance Details: Pt continues to ambulate with flexed posture, taking quick  and short steps.  Cues to slow pace and take longer steps.  Pt needs to use RW for balance.   Gait Pattern: Step-through pattern;Decreased step length - right;Decreased step length - left;Shuffle;Trunk flexed Gait velocity: decreased Stairs: No Wheelchair Mobility Wheelchair Mobility: No    PT Goals (current goals can now be found in the care plan section)    Visit Information  Last PT Received On: 04/20/13 Assistance Needed: +1 History of Present Illness: Patient is an 79 yo male admitted with anemia (Hgb 6.1).  Also noted new dx of severe AS.  Patient with h/o peripheral neuropathy, PVD, HTN.    Subjective Data  Subjective: "I would like to walk."   Cognition  Cognition Arousal/Alertness: Awake/alert Behavior During Therapy: WFL for tasks assessed/performed Overall Cognitive Status: History of cognitive impairments - at baseline Memory: Decreased short-term memory (Patient repeating himself in conversation)    Balance  Static Sitting Balance Static Sitting - Balance Support: No upper extremity supported;Feet supported Static Sitting - Level of Assistance: 5: Stand by assistance Static Sitting - Comment/# of Minutes: 4 Static Standing Balance Static Standing - Balance Support: No upper extremity supported Static Standing - Level of Assistance: 4: Min assist Static Standing - Comment/# of Minutes: Pt with decr balance when not using RW.    End of Session PT - End of Session Equipment Utilized During Treatment: Gait belt Activity Tolerance: Patient limited by fatigue Patient left: in bed;with call bell/phone within reach Nurse Communication: Mobility status        INGOLD,Roschelle Calandra 04/20/2013, 11:39 AM [facility] [phone] (pager)

## 2013-04-20 NOTE — Progress Notes (Signed)
[facility] Progress Note [place] TEAM 1 - Stepdown/ICU TEAM   Justin Deleon WUJ:[id number] DOB: [birth year] DOA: 04/16/2013 PCP: Kimber Relic, MD  Brief narrative: 79 y/o with dementia, mod to severe Aortic stenosis, prostate CA s/p prostatectomy, HTN, GERD, PVD and HTN. He lives at [facility]. He went to see Dr Chilton Si on 10/7 and was found to have heme + stools. He checked a Hgb and noted it was down to 6 and sent him to the ER.   Assessment/Plan:  GI bleed -heme-occult positive stools and reddish blood streaks - blood streaks resolved - EGD negative for bleed - colonoscopy planned for 10/13  Acute on chronic blood loss Anemia  - presented with Hgb of 6.1 - Hgb did not change much after 2 U prbc therefore 2 more ordered - anemia panel revealed low ferritin - IV Iron x 2 days - d/c with oral iron - Hgb improved to 8-9 and stable  Elevated troponin - due to demand from anemia - Cards following   Mod to severe Aortic stenosis - careful with transfusions to prevent heart failure - currently stable   ARF  - previously GFR > 60 - in high 40s on admission - continues to improve  Prostate cancer - s/p prostatectomy   Hypertension - holding B blocker due to transient hypotension   Peripheral vascular disease, unspecified  Hypertrophy of prostate with urinary obstruction and other lower urinary tract symptoms (LUTS)  Code Status: DNR Family Communication: none Disposition Plan: Await colonoscopy in a.m.  Consultants: GI Cardiology  Procedures: EGD - 10/10 - normal EGD, there is nothing to explain anemia on this exam  Antibiotics: none  DVT prophylaxis: SCD  HPI/Subjective: Patient is alert and interactive.  He is quite worried about the severe diarrhea that will develop due to his colonoscopy prep and not being able to get assistance to the bathroom soon enough.  I have observed him standing from the bed and pivoting to the bedside commode and he  does this with no evidence of instability.  I have therefore placed a bedside commode at the bedside in an attempt to assist him with his concern.  He denies chest pain fevers chills nausea vomiting or abdominal pain.  Objective: Blood pressure 127/66, pulse 81, temperature 97.1 F (36.2 C), temperature source Oral, resp. rate 18, height 5\' 10"  (1.778 m), weight 79.3 kg (174 lb 13.2 oz), SpO2 92.00%.  Intake/Output Summary (Last 24 hours) at 04/20/13 1425 Last data filed at 04/20/13 1343  Gross per 24 hour  Intake   1080 ml  Output    800 ml  Net    280 ml   Exam: General: No acute respiratory distress Lungs: Clear to auscultation throughout all fields with no wheeze Cardiovascular: Regular rate and rhythm with 2/6 holosystolic murmur Abdomen: Nontender, nondistended, soft, bowel sounds positive, no rebound, no ascites, peri-umbilical hernia Extremities: No significant cyanosis, clubbing, or edema bilateral lower extremities  Data Reviewed: Basic Metabolic Panel:  Recent Labs Lab 04/16/13 1832 04/17/13 0825 04/18/13 0545 04/19/13 0540  NA 136 137 137 137  K 4.5 4.0 3.9 3.9  CL 101 105 105 102  CO2 24 23 23  18*  GLUCOSE 115* 97 87 72  BUN 28* 23 21 14   CREATININE 1.36* 1.29 1.28 1.10  CALCIUM 9.0 8.5 8.5 8.7   Liver Function Tests:  Recent Labs Lab 04/16/13 1832  AST 25  ALT 20  ALKPHOS 45  BILITOT 0.3  PROT 6.2  ALBUMIN 3.6   CBC:  Recent Labs Lab 04/16/13 1832  04/18/13 0545 04/18/13 1350 04/18/13 2210 04/19/13 0540 04/20/13 0413  WBC 8.9  < > 12.3* 11.1* 10.5 10.7* 7.9  NEUTROABS 7.0  --   --   --   --   --   --   HGB 6.1*  < > 9.1* 8.4* 8.7* 9.0* 8.4*  HCT 19.4*  < > 27.9* 25.7* 26.6* 28.2* 25.9*  MCV 79.5  < > 80.2 80.1 79.9 81.0 80.9  PLT 427*  < > 352 352 339 381 390  < > = values in this interval not displayed.  Cardiac Enzymes:  Recent Labs Lab 04/16/13 2020 04/17/13 0210 04/17/13 0825  CKTOTAL  --  256* 291*  CKMB  --  41.2* 49.7*   TROPONINI 0.89* 4.33* 3.26*   BNP (last 3 results)  Recent Labs  04/17/13 0825  PROBNP 4224.0*    Recent Results (from the past 240 hour(s))  MRSA PCR SCREENING     Status: None   Collection Time    04/16/13 10:13 PM      Result Value Range Status   MRSA by PCR NEGATIVE  NEGATIVE Final   Comment:            The GeneXpert MRSA Assay (FDA     approved for NASAL specimens     only), is one component of a     comprehensive MRSA colonization     surveillance program. It is not     intended to diagnose MRSA     infection nor to guide or     monitor treatment for     MRSA infections.     Studies:  Recent x-ray studies have been reviewed in detail by the Attending Physician  Scheduled Meds:  Scheduled Meds: . buPROPion  300 mg Oral BH-q7a  . cholecalciferol  3,000 Units Oral Daily  . docusate sodium  300 mg Oral Daily  . gabapentin  1,500 mg Oral QHS  . multivitamin-lutein  2 capsule Oral Daily  . polyethylene glycol  17 g Oral Daily  . polyethylene glycol-electrolytes  4,000 mL Oral Once  . senna-docusate  3 tablet Oral Daily  . sodium chloride  3 mL Intravenous Q12H  . tamsulosin  0.4 mg Oral QPC breakfast  . vitamin C  1,000 mg Oral BID  . vitamin E  1,000 Units Oral Daily     Time spent on care of this patient: 35 min   Trenesha Alcaide T, MD  [facility] Office  [phone] Pager - Text Page per Loretha Stapler as per below:  On-Call/Text Page:      [email or web address]      password [email or web address]  If 7PM-7AM, please contact night-coverage [email or web address] Password [email or web address] 04/20/2013, 2:25 PM   LOS: 4 days

## 2013-04-20 NOTE — Progress Notes (Signed)
Pt's son Jonny Ruiz in to see pt and explain that pt for colonscopy tomorrow and has pre to drink.  Also instructed pt to call for assist before getting out of bed to prevent fall.  Verbalized understanding.  Amanda Pea, Charity fundraiser.

## 2013-04-20 NOTE — Telephone Encounter (Signed)
Nellie a nurse in the heart failure mch called looking for Dr.Smith. advised her that he was in the hospital today, she sts that she will page him

## 2013-04-20 NOTE — Progress Notes (Signed)
Attempted to reach Dr Michaelle Copas office to give pt's son phone no. For MD to give him a call, unable to reach him.  Message left with answering service to call nurse.  Amanda Pea, Charity fundraiser.

## 2013-04-20 NOTE — Progress Notes (Signed)
Pt stool clear and tolerated prep well.  Will continue to monitor.  Amanda Pea, Charity fundraiser.

## 2013-04-20 NOTE — Progress Notes (Signed)
Patient Name: Justin Deleon Date of Encounter: 04/20/2013    SUBJECTIVE: Last seen 1.5 years ago at which time we decided against surgical AVR but left the option for TAVR open to discuss if symptoms. At last cath the AS was moderate to severe. Admitted now for severe anemia and elevated troponin. For the 2 weeks prior to admission, the patient has had exertional interscapular pain. It is relieved by rest. Considerable discussion with the patient and son this morning concerning cardiac issues.  Over weekend he has improved. He denies dyspnea and has no interscapular pain but activity is limited.  TELEMETRY:  NSR Filed Vitals:   04/19/13 1045 04/19/13 1300 04/19/13 2129 04/20/13 0432  BP: 125/60 112/58 123/74 112/58  Pulse:  80 85 72  Temp: 97.6 F (36.4 C) 97.9 F (36.6 C) 98.5 F (36.9 C) 97.3 F (36.3 C)  TempSrc: Oral Oral Oral Axillary  Resp:  20 18 18   Height: 5\' 10"  (1.778 m)     Weight: 177 lb 12.8 oz (80.65 kg)   174 lb 13.2 oz (79.3 kg)  SpO2: 98% 94% 94% 93%    Intake/Output Summary (Last 24 hours) at 04/20/13 0913 Last data filed at 04/20/13 0433  Gross per 24 hour  Intake    720 ml  Output    600 ml  Net    120 ml    LABS: Basic Metabolic Panel:  Recent Labs  16/10/96 0545 04/19/13 0540  NA 137 137  K 3.9 3.9  CL 105 102  CO2 23 18*  GLUCOSE 87 72  BUN 21 14  CREATININE 1.28 1.10  CALCIUM 8.5 8.7   CBC:  Recent Labs  04/19/13 0540 04/20/13 0413  WBC 10.7* 7.9  HGB 9.0* 8.4*  HCT 28.2* 25.9*  MCV 81.0 80.9  PLT 381 390   Cardiac Enzymes: No results found for this basename: CKTOTAL, CKMB, CKMBINDEX, TROPONINI,  in the last 72 hours ECHO 09/2011: Conclusions: 1. Moderate to severe aortic valve stenosis. Peak velocity 3.56 m/sec 2. There is mild aortic root dilatation. 3. Mild left atrial enlargement. 4. Left ventricular ejection fraction estimated by 2D at 55-60 percent. 5. There is mild concentric left ventricle hypertrophy. 6. Analysis  of mitral valve inflow, pulmonary vein Doppler and tissue Doppler suggests grade I diastolic dysfunction without elevated left atrial pressure. 7. Trace of pulmonic valve regurgitation. 8. There is mild tricuspid regurgitation.   ------------------------------------------------------------ ECHO 04/17/13: Study Conclusions  - Left ventricle: The cavity size was normal. There was mild focal basal hypertrophy of the septum. Systolic function was normal. The estimated ejection fraction was in the range of 55% to 60%. Possible hypokinesis of the apical myocardium. - Aortic valve: There was severe stenosis. Mild regurgitation. Valve area: 0.63cm^2(VTI). Valve area: 0.57cm^2 (Vmax). - Mitral valve: Calcified annulus. Moderate regurgitation. - Left atrium: The atrium was mildly to moderately dilated. - Right atrium: The atrium was mildly dilated. - Pulmonary arteries: Systolic pressure was severely increased. PA peak pressure: 71mm Hg (S).  BNP    Component Value Date/Time   PROBNP 4224.0* 04/17/2013 0825    Radiology/Studies:  Pleural effusion on CXR  Physical Exam: Blood pressure 112/58, pulse 72, temperature 97.3 F (36.3 C), temperature source Axillary, resp. rate 18, height 5\' 10"  (1.778 m), weight 174 lb 13.2 oz (79.3 kg), SpO2 93.00%. Weight change: -1.8 oz (-0.05 kg)   4/6 systolic murmur of aortic stenosis. No regurgitation, murmurs heard. Neck veins not distended Chest is clear anteriorly  ASSESSMENT:  1. Severe anemia, likely GI blood loss from either gastric soure or colonic AV malformations (associated with AS). For colonoscopy today. 2. Exertional angina expressed as interscapular pain. This is likely a consequence of severe anemia in the setting of aortic stenosis, plus or minus coronary artery disease. Now resolved with improved hemoglobin. 3. Elevated troponin and CK markers, likely secondary to severe anemia in the setting of AS and CAD.m Trending down. 4.  Acute diastolic heart failure(BNP 4200), secondary to anemia, in the setting of significant AS. Asymptomatic with reference to symptoms.  Plan:  1. Await GI w/u. 2. Plan conservative therapy for AS unless symptomatic once anemia resolved and patient ambulatory. Selinda Eon 04/20/2013, 9:13 AM

## 2013-04-20 NOTE — Telephone Encounter (Signed)
New Problem   Nurse request a call back.// no details.

## 2013-04-20 NOTE — Progress Notes (Signed)
The consent for the colonoscopy was signed and placed in the patient's chart.

## 2013-04-21 ENCOUNTER — Encounter (HOSPITAL_COMMUNITY): Payer: Self-pay | Admitting: Gastroenterology

## 2013-04-21 ENCOUNTER — Encounter (HOSPITAL_COMMUNITY)
Admission: EM | Disposition: A | Discharge status: Skilled Nursing Facility | Payer: Self-pay | Source: Home / Self Care | Attending: Internal Medicine

## 2013-04-21 DIAGNOSIS — N289 Disorder of kidney and ureter, unspecified: Secondary | ICD-10-CM

## 2013-04-21 DIAGNOSIS — N401 Enlarged prostate with lower urinary tract symptoms: Secondary | ICD-10-CM

## 2013-04-21 DIAGNOSIS — C61 Malignant neoplasm of prostate: Secondary | ICD-10-CM

## 2013-04-21 DIAGNOSIS — E876 Hypokalemia: Secondary | ICD-10-CM

## 2013-04-21 HISTORY — PX: COLONOSCOPY: SHX5424

## 2013-04-21 LAB — CBC
Hemoglobin: 8.5 g/dL — ABNORMAL LOW (ref 13.0–17.0)
MCH: 26.1 pg (ref 26.0–34.0)
Platelets: 402 10*3/uL — ABNORMAL HIGH (ref 150–400)
RBC: 3.26 MIL/uL — ABNORMAL LOW (ref 4.22–5.81)

## 2013-04-21 LAB — MAGNESIUM: Magnesium: 1.9 mg/dL (ref 1.5–2.5)

## 2013-04-21 LAB — BASIC METABOLIC PANEL
Calcium: 8.6 mg/dL (ref 8.4–10.5)
Creatinine, Ser: 1.15 mg/dL (ref 0.50–1.35)
GFR calc Af Amer: 65 mL/min — ABNORMAL LOW (ref >90)
GFR calc non Af Amer: 56 mL/min — ABNORMAL LOW (ref >90)
Glucose, Bld: 82 mg/dL (ref 70–99)
Potassium: 3.1 mEq/L — ABNORMAL LOW (ref 3.5–5.1)
Sodium: 139 mEq/L (ref 135–145)

## 2013-04-21 SURGERY — COLONOSCOPY
Anesthesia: Moderate Sedation

## 2013-04-21 MED ORDER — MIDAZOLAM HCL 5 MG/ML IJ SOLN
INTRAMUSCULAR | Status: AC
  Filled 2013-04-21: qty 2

## 2013-04-21 MED ORDER — FENTANYL CITRATE 0.05 MG/ML IJ SOLN
INTRAMUSCULAR | Status: AC
  Filled 2013-04-21: qty 2

## 2013-04-21 MED ORDER — POTASSIUM CHLORIDE CRYS ER 20 MEQ PO TBCR
40.0000 meq | EXTENDED_RELEASE_TABLET | ORAL | Status: AC
  Administered 2013-04-21 (×2): 40 meq via ORAL
  Filled 2013-04-21 (×2): qty 2

## 2013-04-21 MED ORDER — FENTANYL CITRATE 0.05 MG/ML IJ SOLN
INTRAMUSCULAR | Status: DC | PRN
  Administered 2013-04-21 (×2): 25 ug via INTRAVENOUS

## 2013-04-21 MED ORDER — MIDAZOLAM HCL 5 MG/5ML IJ SOLN
INTRAMUSCULAR | Status: DC | PRN
  Administered 2013-04-21 (×3): 1 mg via INTRAVENOUS

## 2013-04-21 NOTE — Progress Notes (Signed)
Patient underwent a colonoscopy today.  Possible d/c tomorrow?   Patient reviewed in length of stay meeting this morning and placement in short term SNF was encouraged rather than placement in ALF. Patient is agreeable to higher level of care but was sleepy today and CSW was unable to discuss. CSW contacted his son Evans Levee  [phone]) about SNF placement and he strongly agrees and is pleased with this. Stated that he would talk to his father tonight about this.  CSW contacted Wille Celeste at [facility]. She agrees with above plan and states that she has an vacancy in her SNF unit for patient.  CSW will monitor and plan d/c back to facility at higher level of care when medically stable.  Lorri Frederick. [place]  [phone]

## 2013-04-21 NOTE — Op Note (Signed)
[name] [address] [place], [address]   COLONOSCOPY PROCEDURE REPORT  PATIENT: Justin Deleon, Justin Deleon  MR#: [id number] BIRTHDATE: [birth year] , 87  yrs. old GENDER: Male ENDOSCOPIST: Vida Rigger, MD REFERRED BY: PROCEDURE DATE:  04/21/2013 PROCEDURE:   Colonoscopy, diagnostic ASA CLASS:   Class II INDICATIONS:Anemia, non-specific and heme-positive stool. MEDICATIONS: Fentanyl 50 mcg IV and Versed 3 mg IV  DESCRIPTION OF PROCEDURE:   After the risks benefits and alternatives of the procedure were thoroughly explained, informed consent was obtained.  The Pentax Ped Colon [id number]  endoscope was introduced through the anus and advanced to the terminal ileum which was intubated for a short distance , limited by No adverse events experienced.to advanced to the cecum did require abdominal pressure but no position changes   The quality of the prep was adequate. .  The instrument was then slowly withdrawn as the colon was fully examined.the findings are recorded below and there was no signs of active bleeding and the patient tolerated the procedure well there was no obvious immediate complication           FINDINGS:  #1 internal and external hemorrhoid #2. Questionable mild radiation proctitis 3 sigmoid and distal descending diverticuli and rare hepatic flexure diverticula 4. Questionably  a few small cecal and ascending colon AVMs not bleeding unable to make bleed with washing and suction 5. Otherwise within normal limits to the terminal ileum COMPLICATIONS: none  IMPRESSION:  above  RECOMMENDATIONS:advance diet happy to see back when necessary add iron  reevaluate aspirin and blood thinner needs care with nonsteroidals and check his CBC periodically _______________________________ eSigned:  Vida Rigger, MD 04/21/2013 10:35 AM   CC:

## 2013-04-21 NOTE — Progress Notes (Signed)
OT Cancellation Note  Patient Details Name: Justin Deleon MRN: [id number] DOB: Aug 16, 1925   Cancelled Treatment:    Reason Eval/Treat Not Completed: Patient at procedure or test/ unavailable (colonoscopy )  Boone Master B 04/21/2013, 10:44 AM Pager: [phone]

## 2013-04-21 NOTE — Progress Notes (Signed)
Pt refused am meds after colonoscopy today.  Instructed rather take them tonight.  Will continue tgo monitor.

## 2013-04-21 NOTE — Progress Notes (Addendum)
[facility] Progress Note   Justin Deleon ZOX:[id number] DOB: February 19, 1926 DOA: 04/16/2013 PCP: Kimber Relic, MD  Brief narrative: 79 y/o with dementia, mod to severe Aortic stenosis, prostate CA s/p prostatectomy, HTN, GERD, PVD and HTN. He lives at [facility]. He went to see Dr Chilton Si on 10/7 and was found to have heme + stools. He checked a Hgb and noted it was down to 6 and sent him to the ER. Has received 4 units of PRBC's and his Hgb is now stable; EGD w/o abnormalities that explained bleeding; colonoscopy on today (04/21/13)  Assessment/Plan:  GI bleed -heme-occult positive stools and reddish blood streaks - blood streaks resolved - EGD negative for bleed - colonoscopy planned for today 10/14 -Hgb remains stable 8.5  Acute on chronic blood loss Anemia  - presented with Hgb of 6.1 - Hgb did not change much after 2 U prbc therefore 2 more ordered - anemia panel revealed low ferritin - IV Iron x 2 days  - will d/c with oral iron supplementation - Hgb improved to 8-9 and stable  Elevated troponin - due to demand from anemia - Cards following   Mod to severe Aortic stenosis - careful with transfusions to prevent heart failure - currently stable -plan is for conservative management for now   Acute kidney injury on CKD stage 2 at baseline  - previously GFR > 60 - in high 40s on admission; now 56  - continue improving. Most likely associated with impaired perfusion given anemia. -Cr 1.15 -will monitor  Prostate cancer - s/p prostatectomy   Hypertension - holding B blocker due to transient hypotension   Peripheral vascular disease, unspecified  Hypertrophy of prostate with urinary obstruction and other lower urinary tract symptoms (LUTS)  hypokalemia -will replete -check Mg -BMET in am  Code Status: DNR Family Communication: daughter at bedside Disposition Plan: Await colonoscopy results; if Hgb stable and no further intervention planned, can d/c  tomorrow.  Consultants: GI Cardiology  Procedures: EGD - 10/10 - normal EGD, there is nothing to explain anemia on this exam Colonoscopy 10/14- pending  Antibiotics: none  DVT prophylaxis: SCD  HPI/Subjective: Patient is alert and interactive.  No CP, no SOB; tolerated prep well. Colonoscopy today  Objective: Blood pressure 134/66, pulse 72, temperature 97.7 F (36.5 C), temperature source Oral, resp. rate 11, height 5\' 10"  (1.778 m), weight 79.4 kg (175 lb 0.7 oz), SpO2 100.00%.  Intake/Output Summary (Last 24 hours) at 04/21/13 1032 Last data filed at 04/21/13 0859  Gross per 24 hour  Intake    363 ml  Output    900 ml  Net   -537 ml   Exam: General: No acute respiratory distress Lungs: Clear to auscultation throughout all fields with no wheeze Cardiovascular: Regular rate and rhythm with holosystolic murmur Abdomen: Nontender, nondistended, soft, bowel sounds positive, no rebound, no ascites, peri-umbilical hernia Extremities: No significant cyanosis, clubbing, or edema bilateral lower extremities  Data Reviewed: Basic Metabolic Panel:  Recent Labs Lab 04/16/13 1832 04/17/13 0825 04/18/13 0545 04/19/13 0540 04/21/13 0405  NA 136 137 137 137 139  K 4.5 4.0 3.9 3.9 3.1*  CL 101 105 105 102 104  CO2 24 23 23  18* 24  GLUCOSE 115* 97 87 72 82  BUN 28* 23 21 14 10   CREATININE 1.36* 1.29 1.28 1.10 1.15  CALCIUM 9.0 8.5 8.5 8.7 8.6   Liver Function Tests:  Recent Labs Lab 04/16/13 1832  AST 25  ALT 20  ALKPHOS 45  BILITOT 0.3  PROT 6.2  ALBUMIN 3.6   CBC:  Recent Labs Lab 04/16/13 1832  04/18/13 1350 04/18/13 2210 04/19/13 0540 04/20/13 0413 04/21/13 0405  WBC 8.9  < > 11.1* 10.5 10.7* 7.9 7.0  NEUTROABS 7.0  --   --   --   --   --   --   HGB 6.1*  < > 8.4* 8.7* 9.0* 8.4* 8.5*  HCT 19.4*  < > 25.7* 26.6* 28.2* 25.9* 26.5*  MCV 79.5  < > 80.1 79.9 81.0 80.9 81.3  PLT 427*  < > 352 339 381 390 402*  < > = values in this interval not  displayed.  Cardiac Enzymes:  Recent Labs Lab 04/16/13 2020 04/17/13 0210 04/17/13 0825  CKTOTAL  --  256* 291*  CKMB  --  41.2* 49.7*  TROPONINI 0.89* 4.33* 3.26*   BNP (last 3 results)  Recent Labs  04/17/13 0825  PROBNP 4224.0*    Recent Results (from the past 240 hour(s))  MRSA PCR SCREENING     Status: None   Collection Time    04/16/13 10:13 PM      Result Value Range Status   MRSA by PCR NEGATIVE  NEGATIVE Final   Comment:            The GeneXpert MRSA Assay (FDA     approved for NASAL specimens     only), is one component of a     comprehensive MRSA colonization     surveillance program. It is not     intended to diagnose MRSA     infection nor to guide or     monitor treatment for     MRSA infections.     Scheduled Meds:  Scheduled Meds: . [MAR HOLD] buPROPion  300 mg Oral BH-q7a  . [facility] HOLD] cholecalciferol  3,000 Units Oral Daily  . [facility] HOLD] docusate sodium  300 mg Oral Daily  . [facility] HOLD] gabapentin  1,500 mg Oral QHS  . [MAR HOLD] multivitamin-lutein  2 capsule Oral Daily  . [MAR HOLD] polyethylene glycol  17 g Oral Daily  . potassium chloride  40 mEq Oral Q4H  . [MAR HOLD] senna-docusate  3 tablet Oral Daily  . [MAR HOLD] sodium chloride  3 mL Intravenous Q12H  . [facility] HOLD] tamsulosin  0.4 mg Oral QPC breakfast  . [MAR HOLD] vitamin C  1,000 mg Oral BID  . [MAR HOLD] vitamin E  1,000 Units Oral Daily   . sodium chloride 500 mL (04/21/13 0920)   Time spent on care of this patient: 35 min   Jenasis Straley, MD [phone]   If 7PM-7AM, please contact night-coverage [email or web address] Password [email or web address] 04/21/2013, 10:32 AM   LOS: 5 days

## 2013-04-21 NOTE — Evaluation (Signed)
Occupational Therapy Evaluation Patient Details Name: Justin Deleon MRN: [id number] DOB: [birth year] Today's Date: 04/21/2013 Time: 9147-8295 OT Time Calculation (min): 12 min  OT Assessment / Plan / Recommendation History of present illness Patient is an 79 yo male admitted with anemia (Hgb 6.1).  Also noted new dx of severe AS.  Patient with h/o peripheral neuropathy, PVD, HTN.   Clinical Impression   PT admitted with anemia s/p colonscopy. Pt currently with functional limitiations due to the deficits listed below (see OT problem list).  Pt will benefit from skilled OT to increase their independence and safety with adls and balance to allow discharge HHOT at [facility] .     OT Assessment  Patient needs continued OT Services    Follow Up Recommendations  Home health OT    Barriers to Discharge      Equipment Recommendations  None recommended by OT    Recommendations for Other Services    Frequency  Min 2X/week    Precautions / Restrictions Precautions Precautions: Fall   Pertinent Vitals/Pain No pain reported Noted scrotal edema    ADL  Eating/Feeding: Modified independent Where Assessed - Eating/Feeding: Bed level Grooming: Wash/dry hands;Min guard Where Assessed - Grooming: Unsupported standing (walking outside of RW) Toilet Transfer: Supervision/safety Statistician Method: Sit to Barista: Regular height toilet Toileting - Clothing Manipulation and Hygiene: Supervision/safety Where Assessed - Engineer, mining and Hygiene: Sit to stand from 3-in-1 or toilet Equipment Used: Gait belt;Rolling walker Transfers/Ambulation Related to ADLs: Pt ambulated to the rest room using RW pushing the RW slightly too far ahead. Pt uses a rollator at baseline . Pt reports "i can do it just let me go" ADL Comments: pt very independent and expressing urgency in the need to move faster to the bathroom on OT arrival. Pt attempting OOB  independently with bed alarm sounding. Pt completed toilet transfer with good use of RW. Pt standing at sink for hand hygiene with RW outside base of support. Pt needed cues for safety. Pt able to complete bed mobility without (A)    OT Diagnosis: Generalized weakness;Cognitive deficits  OT Problem List: Decreased strength;Decreased range of motion;Decreased activity tolerance;Impaired balance (sitting and/or standing);Decreased cognition;Decreased safety awareness;Decreased knowledge of use of DME or AE;Decreased knowledge of precautions OT Treatment Interventions: Self-care/ADL training;Therapeutic exercise;DME and/or AE instruction;Therapeutic activities;Cognitive remediation/compensation;Patient/family education;Balance training   OT Goals(Current goals can be found in the care plan section) Acute Rehab OT Goals Patient Stated Goal: to return to [facility] OT Goal Formulation: With patient Time For Goal Achievement: 05/05/13 Potential to Achieve Goals: Good ADL Goals Pt Will Perform Grooming: with modified independence;standing Pt Will Perform Upper Body Bathing: with modified independence;standing Pt Will Perform Lower Body Bathing: with modified independence;sit to/from stand Pt Will Transfer to Toilet: with modified independence;bedside commode  Visit Information  Last OT Received On: 04/21/13 Assistance Needed: +1 History of Present Illness: Patient is an 79 yo male admitted with anemia (Hgb 6.1).  Also noted new dx of severe AS.  Patient with h/o peripheral neuropathy, PVD, HTN.       Prior Functioning     Home Living Family/patient expects to be discharged to:: Other (Comment) (independent living- [facility]) Living Arrangements: Alone Available Help at Discharge: Personal care attendant;Available PRN/intermittently;Family Type of Home: Independent living facility Home Access: Level entry Home Layout: One level Home Equipment: Walker - 4 wheels;Shower seat Prior  Function Level of Independence: Independent with assistive device(s);Needs assistance ADL's / Homemaking  Assistance Needed: Housekeeping and meals, transportation Comments: Patient exercises 3x/week in exercise room at facility Communication Communication: HOH Dominant Hand: Right         Vision/Perception Vision - History Baseline Vision: Wears glasses all the time Patient Visual Report: No change from baseline   Cognition  Cognition Arousal/Alertness: Awake/alert Behavior During Therapy: WFL for tasks assessed/performed Overall Cognitive Status: History of cognitive impairments - at baseline Memory: Decreased short-term memory    Extremity/Trunk Assessment Upper Extremity Assessment Upper Extremity Assessment: Overall WFL for tasks assessed Lower Extremity Assessment Lower Extremity Assessment: Defer to PT evaluation Cervical / Trunk Assessment Cervical / Trunk Assessment: Kyphotic     Mobility Bed Mobility Bed Mobility: Supine to Sit;Sitting - Scoot to Edge of Bed;Sit to Supine Supine to Sit: 6: Modified independent (Device/Increase time) Sitting - Scoot to Edge of Bed: 6: Modified independent (Device/Increase time) Sit to Supine: 6: Modified independent (Device/Increase time) Details for Bed Mobility Assistance: pt needed extended time and HOB 15 degrees Transfers Transfers: Sit to Stand;Stand to Sit Sit to Stand: 4: Min guard;With upper extremity assist;From bed Stand to Sit: 4: Min guard;With upper extremity assist;To bed Details for Transfer Assistance: cues for safety with RW     Exercise     Balance     End of Session OT - End of Session Activity Tolerance: Patient tolerated treatment well Patient left: in bed;with call bell/phone within reach;with bed alarm set Nurse Communication: Mobility status;Precautions  GO     Harolyn Rutherford 04/21/2013, 2:21 PM Pager: [phone]

## 2013-04-21 NOTE — Progress Notes (Signed)
PT Cancellation Note  Patient Details Name: Justin Deleon MRN: [id number] DOB: 01/07/1926   Cancelled Treatment:    Reason Eval/Treat Not Completed: Patient at procedure or test/unavailable (pt leaving for colonoscopy and unable to participate at this time)   Justin Deleon 04/21/2013, 8:36 AM Justin Deleon, PT [phone]

## 2013-04-21 NOTE — Progress Notes (Addendum)
The patient refused the bed alarm.  He was told to call the nurse for help when ambulating and verbalized understanding, but was noncompliant overnight.

## 2013-04-22 ENCOUNTER — Encounter (HOSPITAL_COMMUNITY): Payer: Self-pay | Admitting: Gastroenterology

## 2013-04-22 LAB — CBC
Hemoglobin: 9 g/dL — ABNORMAL LOW (ref 13.0–17.0)
MCH: 26.2 pg (ref 26.0–34.0)
MCHC: 31.6 g/dL (ref 30.0–36.0)
Platelets: 424 10*3/uL — ABNORMAL HIGH (ref 150–400)
RBC: 3.44 MIL/uL — ABNORMAL LOW (ref 4.22–5.81)
RDW: 17.9 % — ABNORMAL HIGH (ref 11.5–15.5)

## 2013-04-22 LAB — BASIC METABOLIC PANEL
CO2: 23 mEq/L (ref 19–32)
GFR calc non Af Amer: 56 mL/min — ABNORMAL LOW (ref >90)
Glucose, Bld: 76 mg/dL (ref 70–99)
Potassium: 4 mEq/L (ref 3.5–5.1)
Sodium: 139 mEq/L (ref 135–145)

## 2013-04-22 MED ORDER — FERROUS SULFATE 325 (65 FE) MG PO TABS: 325.0000 mg | ORAL_TABLET | Freq: Every day | ORAL | Status: DC

## 2013-04-22 MED ORDER — ZOLPIDEM TARTRATE 10 MG SL SUBL: SUBLINGUAL_TABLET | SUBLINGUAL | Status: DC

## 2013-04-22 NOTE — Progress Notes (Signed)
The patient had some confusion throughout the night.  He was disoriented to place and situation at times.  He did not sleep well despite having an Ambien.  Otherwise, the patient did not have any complaints of pain and did not have any other changes overnight.

## 2013-04-22 NOTE — Progress Notes (Signed)
[facility] Progress Note   Justin Deleon EXB:[id number] DOB: 1925-08-26 DOA: 04/16/2013 PCP: Kimber Relic, MD  Brief narrative: 79 y/o with dementia, mod to severe Aortic stenosis, prostate CA s/p prostatectomy, HTN, GERD, PVD and HTN. He lives at [facility]. He went to see Dr Chilton Si on 10/7 and was found to have heme + stools. He checked a Hgb and noted it was down to 6 and sent him to the ER. Has received 4 units of PRBC's and his Hgb is now stable; EGD w/o abnormalities that explained bleeding; colonoscopy on today (04/21/13)  Assessment/Plan: GI bleed -heme-occult positive stools and reddish blood streaks - blood streaks resolved - EGD negative for bleed - colonoscopy as below. -Hgb remains stable 8.5  Acute on chronic blood loss Anemia  - presented with Hgb of 6.1 - Hgb did not change much after 2 U prbc therefore 2 more ordered - anemia panel revealed low ferritin - IV Iron x 2 days  - will d/c with oral iron supplementation - Hgb improved to 8-9 and stable  Elevated troponin - due to demand from anemia - Cards following   Mod to severe Aortic stenosis - careful with transfusions to prevent heart failure - currently stable -plan is for conservative management for now   Acute kidney injury on CKD stage 2 at baseline  - previously GFR > 60 - in high 40s on admission; now 56  - continue improving. Most likely associated with impaired perfusion given anemia. -Cr 1.15 -will monitor  Prostate cancer - s/p prostatectomy   Hypertension - holding B blocker due to transient hypotension   Peripheral vascular disease, unspecified  Hypertrophy of prostate with urinary obstruction and other lower urinary tract symptoms (LUTS):  hypokalemia -will replete -check Mg -BMET in am  Code Status: DNR Family Communication: daughter at bedside Disposition Plan: Await colonoscopy results; if Hgb stable and no further intervention planned, can d/c  tomorrow.  Consultants: GI Cardiology  Procedures: EGD - 10/10 - normal EGD, there is nothing to explain anemia on this exam Colonoscopy 10/14- internal and external hemorrhoid #2. Questionable mild  radiation proctitis 3 sigmoid and distal descending diverticuli and  rare hepatic flexure diverticula 4. Questionably a few small cecal  and ascending colon AVMs   Antibiotics: none  DVT prophylaxis: SCD  HPI/Subjective: Patient is alert and interactive.  No CP, no SOB; tolerated prep well. Colonoscopy today  Objective: Blood pressure 145/74, pulse 76, temperature 97.8 F (36.6 C), temperature source Oral, resp. rate 19, height 5\' 10"  (1.778 m), weight 78.472 kg (173 lb), SpO2 94.00%.  Intake/Output Summary (Last 24 hours) at 04/22/13 0749 Last data filed at 04/22/13 0551  Gross per 24 hour  Intake    700 ml  Output   1125 ml  Net   -425 ml   Exam: General: No acute respiratory distress Lungs: Clear to auscultation throughout all fields with no wheeze Cardiovascular: Regular rate and rhythm with holosystolic murmur Abdomen: Nontender, nondistended, soft, bowel sounds positive, no rebound, no ascites, peri-umbilical hernia Extremities: No significant cyanosis, clubbing, or edema bilateral lower extremities  Data Reviewed: Basic Metabolic Panel:  Recent Labs Lab 04/17/13 0825 04/18/13 0545 04/19/13 0540 04/21/13 0405 04/21/13 0930 04/22/13 0540  NA 137 137 137 139  --  139  K 4.0 3.9 3.9 3.1*  --  4.0  CL 105 105 102 104  --  105  CO2 23 23 18* 24  --  23  GLUCOSE 97 87 72  82  --  76  BUN 23 21 14 10   --  10  CREATININE 1.29 1.28 1.10 1.15  --  1.15  CALCIUM 8.5 8.5 8.7 8.6  --  8.9  MG  --   --   --   --  1.9  --    Liver Function Tests:  Recent Labs Lab 04/16/13 1832  AST 25  ALT 20  ALKPHOS 45  BILITOT 0.3  PROT 6.2  ALBUMIN 3.6   CBC:  Recent Labs Lab 04/16/13 1832  04/18/13 2210 04/19/13 0540 04/20/13 0413 04/21/13 0405 04/22/13 0540   WBC 8.9  < > 10.5 10.7* 7.9 7.0 7.0  NEUTROABS 7.0  --   --   --   --   --   --   HGB 6.1*  < > 8.7* 9.0* 8.4* 8.5* 9.0*  HCT 19.4*  < > 26.6* 28.2* 25.9* 26.5* 28.5*  MCV 79.5  < > 79.9 81.0 80.9 81.3 82.8  PLT 427*  < > 339 381 390 402* 424*  < > = values in this interval not displayed.  Cardiac Enzymes:  Recent Labs Lab 04/16/13 2020 04/17/13 0210 04/17/13 0825  CKTOTAL  --  256* 291*  CKMB  --  41.2* 49.7*  TROPONINI 0.89* 4.33* 3.26*   BNP (last 3 results)  Recent Labs  04/17/13 0825  PROBNP 4224.0*    Recent Results (from the past 240 hour(s))  MRSA PCR SCREENING     Status: None   Collection Time    04/16/13 10:13 PM      Result Value Range Status   MRSA by PCR NEGATIVE  NEGATIVE Final   Comment:            The GeneXpert MRSA Assay (FDA     approved for NASAL specimens     only), is one component of a     comprehensive MRSA colonization     surveillance program. It is not     intended to diagnose MRSA     infection nor to guide or     monitor treatment for     MRSA infections.     Scheduled Meds:  Scheduled Meds: . buPROPion  300 mg Oral BH-q7a  . cholecalciferol  3,000 Units Oral Daily  . docusate sodium  300 mg Oral Daily  . gabapentin  1,500 mg Oral QHS  . multivitamin-lutein  2 capsule Oral Daily  . polyethylene glycol  17 g Oral Daily  . senna-docusate  3 tablet Oral Daily  . sodium chloride  3 mL Intravenous Q12H  . tamsulosin  0.4 mg Oral QPC breakfast  . vitamin C  1,000 mg Oral BID  . vitamin E  1,000 Units Oral Daily     Time spent on care of this patient: 35 min   Marinda Elk, MD [phone]   If 7PM-7AM, please contact night-coverage [email or web address] Password [email or web address] 04/22/2013, 7:49 AM   LOS: 6 days

## 2013-04-22 NOTE — Progress Notes (Signed)
PT Cancellation Note  Patient Details Name: JAUN GALLUZZO MRN: [id number] DOB: Sep 17, 1925   Cancelled Treatment:    Reason Eval/Treat Not Completed: Patient declined, no reason specified.  Refused to work with PT.  Thanks.   INGOLD,Sruthi Maurer 04/22/2013, 11:12 AM  [facility] [phone] (pager)

## 2013-04-22 NOTE — Plan of Care (Signed)
Problem: Discharge Progression Outcomes Goal: Home Health Care arrangements in place Outcome: Not Applicable Date Met:  04/22/13 Discharging to SNF @ [facility], [place], [place]. Packet to be sent with son who will transport pt to facility

## 2013-04-22 NOTE — Progress Notes (Signed)
Justin Deleon 9:45 AM  Subjective: Patient without any post colonoscopy problems and no new complaint  Objective: Vital signs stable afebrile no acute distress abdomen is soft nontender  Assessment: Probable AVM bleeding currently no signs of bleeding  Plan: Either myself or one of my partners happy to see back when necessary and care with blood thinners going forward [facility] E

## 2013-04-22 NOTE — Clinical Social Work Placement (Signed)
     Clinical Social Work Department CLINICAL SOCIAL WORK PLACEMENT NOTE 04/22/2013  Patient:  Justin Deleon, Justin Deleon  Account Number:  [id number] Admit date:  04/16/2013  Clinical Social Worker:  Lupita Leash Hayes Czaja, LCSWA  Date/time:  04/20/2013 02:00 PM  Clinical Social Work is seeking post-discharge placement for this patient at the following level of care:      (*CSW will update this form in Epic as items are completed)     Patient/family provided with [facility] Department of Clinical Social Works list of facilities offering this level of care within the geographic area requested by the patient (or if unable, by the patients family).    Patient/family informed of their freedom to choose among providers that offer the needed level of care, that participate in Medicare, Medicaid or managed care program needed by the patient, have an available bed and are willing to accept the patient.    Patient/family informed of [facility] ownership interest in [facility], as well as of the fact that they are under no obligation to receive care at this facility.  PASARR submitted to EDS on  PASARR number received from E[employer] on   FL2 transmitted to all facilities in geographic area requested by pt/family on  04/20/2013 FL2 transmitted to all facilities within larger geographic area on   Patient informed that his/her managed care company has contracts with or will negotiate with  certain facilities, including the following:   Has existing PASARR. No SNF search done as patient will increase level of care at [facility] to SNF     Patient/family informed of bed offers received:  04/21/2013 Patient chooses bed at [facility] Physician recommends and patient chooses bed at    Patient to be transferred to [facility] on  04/22/2013 Patient to be transferred to facility by [facility] with [facility]  The following physician request were entered in Epic:   Additional  Comments: 04/22/13.  Ok per MD for d/c today to SNF level at [facility]. Patient was not pleased wtih this arrangement as he wanted to return to his apartment but he reluctantly agreed to short term stay in SNF. Patient's [name] is aware and agrees that SNF short term is appropriate plan. No further CSW needs identified. CSW signing off.

## 2013-04-22 NOTE — Progress Notes (Signed)
Called report to [facility]. Told them that the son will pick patient up at 1pm and take him and the discharge packet with him to the facility.

## 2013-04-22 NOTE — Discharge Summary (Signed)
Physician Discharge Summary  Justin Deleon AVW:[id number] DOB: 04-Feb-1926 DOA: 04/16/2013  PCP: Kimber Relic, MD  Admit date: 04/16/2013 Discharge date: 04/22/2013  Time spent: 35 minutes  Recommendations for Outpatient Follow-up:  1. Follow up with CBC in 2 weeks.  Discharge Diagnoses:  Principal Problem:   Anemia Active Problems:   Moderate to severe aortic stenosis   Prostate cancer s/p prostatectomy   Hypotension, unspecified   Peripheral vascular disease, unspecified   Hypertrophy of prostate with urinary obstruction and other lower urinary tract symptoms (LUTS)   GI bleed   Elevated troponin   Generalized weakness   Renal insufficiency   Calcific aortic stenosis   Discharge Condition: guarded  Diet recommendation: heart healthy  Filed Weights   04/20/13 0432 04/21/13 0542 04/22/13 0509  Weight: 79.3 kg (174 lb 13.2 oz) 79.4 kg (175 lb 0.7 oz) 78.472 kg (173 lb)    History of present illness:  79 y.o. Caucasian male with history of prostate cancer, hypertension, cataracts, GERD, peripheral vascular disease, heart murmur, and hypertension who presents with the above complaints. Patient is a poor historian, but was able to provide some history. Patient had apparently seen his primary care physician Dr. Chilton Si on 04/14/2013 and had labs checked, and his anemia was worse with a hemoglobin of 8.4 on 04/06/2013. He was referred to Dr. Danise Edge who had performed colonoscopy in 2009, results unavailable for my review. Dr. Laural Benes checked patient's labs and he was anemic as a result was instructed to have the patient come to the emergency department for further care and management. In the emergency department patient was found to be anemic with hemoglobin of 6.1. Hospitalist service was asked to admit the patient for further care and management. Patient reported that he did notice some dark brown stools but denied any black stools or any overt blood in his stools. Has not  had any recent fevers, chills, nausea, vomiting, chest pain, shortness of breath, diarrhea, headaches or vision changes. Patient does complain of nonspecific generalized abdominal pain which he has had since 04/14/2013. Patient denies any NSAID use, does admit to taking aspirin 81 mg daily, however is not recorded on his medication list.   Hospital Course:  GI bleed /Acute on chronic blood loss Anemia: -heme-occult positive stools and reddish blood streaks - blood streaks resolved  - EGD negative for bleed  - colonoscopy: - internal and external hemorrhoid.Questionable mild radiation proctitis.. sigmoid and distal descending diverticuli and rare hepatic flexure diverticula. Questionably a few small cecal and ascending colon AVMs - presented with Hgb of 6.1  - Hgb did not change much after 2 U prbc therefore 2 more ordered  - anemia panel revealed low ferritin - IV Iron x 2 days  - will d/c with oral iron supplementation  - Hgb improved to 8-9 and stable.  Elevated troponin  - due to demand from anemia  - Cards following   Mod to severe Aortic stenosis  - careful with transfusions to prevent heart failure  - currently stable  -plan is for conservative management for now  Acute kidney injury on CKD stage 2 at baseline  - previously GFR > 60 - in high 40s on admission; now 56  - continue improving. Most likely associated with impaired perfusion given anemia.  -Cr 1.15   Prostate cancer  - s/p prostatectomy   Hypertension  - holding B blocker due to transient hypotension   Peripheral vascular disease, unspecified  Hypertrophy of prostate with urinary obstruction  and other lower urinary tract symptoms (LUTS):   Procedures:  EGD  Colonoscopy  Consultations:  GI  cards  Discharge Exam: Filed Vitals:   04/22/13 0509  BP: 145/74  Pulse: 76  Temp: 97.8 F (36.6 C)  Resp: 19    General: A&O x3 Cardiovascular: RRR Respiratory: good air movement CTA B/L  Discharge  Instructions      Discharge Orders   Future Appointments Provider Department Dept Phone   04/28/2013 9:45 AM Kimber Relic, MD [facility] [phone]   Future Orders Complete By Expires   Diet - low sodium heart healthy  As directed    Increase activity slowly  As directed        Medication List         buPROPion 300 MG 24 hr tablet  Commonly known as:  WELLBUTRIN XL  Take 300 mg by mouth every morning.     cholecalciferol 1000 UNITS tablet  Commonly known as:  VITAMIN D  Take 3,000 Units by mouth daily.     docusate sodium 100 MG capsule  Commonly known as:  COLACE  Take 300 mg by mouth daily.     ferrous sulfate 325 (65 FE) MG tablet  Take 1 tablet (325 mg total) by mouth daily with breakfast.     ferrous sulfate 325 (65 FE) MG tablet  Take 325 mg by mouth daily with breakfast. Take 1 daily to treat anemia.     gabapentin 300 MG capsule  Commonly known as:  NEURONTIN  Take 1,500 mg by mouth at bedtime.     multivitamin with minerals tablet  Take 1 tablet by mouth as directed.     PRESERVISION AREDS Caps  Take 2 capsules by mouth daily.     polyethylene glycol powder powder  Commonly known as:  GLYCOLAX/MIRALAX  Take 17 g by mouth daily. Mix 17 grams with 8 oz. of water daily for laxative.     potassium chloride 10 MEQ tablet  Commonly known as:  K-DUR,KLOR-CON  Take 10 mEq by mouth daily.     Red Yeast Rice 600 MG Tabs  Take 3 tablets by mouth daily.     SENNA PLUS 8.6-50 MG per tablet  Generic drug:  senna-docusate  Take 3 tablets by mouth daily.     SYSTANE 0.4-0.3 % Soln  Generic drug:  Polyethyl Glycol-Propyl Glycol  Apply to eye. One drop 4 times daily as needed for dry eyes     tamsulosin 0.4 MG Caps capsule  Commonly known as:  FLOMAX  Take one capsule by mouth once daily     vitamin C 1000 MG tablet  Take 1,000 mg by mouth 2 (two) times daily.     vitamin E 1000 UNIT capsule  Take 1,000 Units by mouth daily. Take 1 capsule  daily for vitamin E supplement.     vitamin E 400 UNIT capsule  Take 400 Units by mouth daily.     Zolpidem Tartrate 10 MG Subl  Take one tablet by mouth at bedtime as needed for rest.       No Known Allergies Follow-up Information   Follow up with GREEN, Lenon Curt, MD In 2 weeks. (hospital follow up check a cbc)    Specialty:  Internal Medicine   Contact information:   [address] [place] [address] [phone]        The results of significant diagnostics from this hospitalization (including imaging, microbiology, ancillary and laboratory) are listed below  for reference.    Significant Diagnostic Studies: Dg Abd 2 Views  04/16/2013   *RADIOLOGY REPORT*  Clinical Data: Epigastric abdominal pain for 2 weeks.  ABDOMEN - 2 VIEW  Comparison: Pelvic radiograph performed 03/30/2011  Findings: The visualized bowel gas pattern is unremarkable. Scattered air and fluid filled loops of colon are seen; no abnormal dilatation of small bowel loops is seen to suggest small bowel obstruction.  No free intra-abdominal air is identified, though evaluation for free air is limited on a single supine view.  Degenerative change is noted at the lower lumbar spine; the sacroiliac joints are unremarkable in appearance.  A small left pleural effusion is noted; calcified granulomata are seen overlying the left lung base.  Clips are noted within the right upper quadrant, reflecting prior cholecystectomy.  Brachytherapy seeds are noted at the prostate bed.  Screws are seen within the proximal left femur.  IMPRESSION:  1.  Unremarkable bowel gas pattern; no free intra-abdominal air seen. 2.  Small left pleural effusion noted.   Original Report Authenticated By: Tonia Ghent, M.D.    Microbiology: Recent Results (from the past 240 hour(s))  MRSA PCR SCREENING     Status: None   Collection Time    04/16/13 10:13 PM      Result Value Range Status   MRSA by PCR NEGATIVE  NEGATIVE Final   Comment:             The GeneXpert MRSA Assay (FDA     approved for NASAL specimens     only), is one component of a     comprehensive MRSA colonization     surveillance program. It is not     intended to diagnose MRSA     infection nor to guide or     monitor treatment for     MRSA infections.     Labs: Basic Metabolic Panel:  Recent Labs Lab 04/17/13 0825 04/18/13 0545 04/19/13 0540 04/21/13 0405 04/21/13 0930 04/22/13 0540  NA 137 137 137 139  --  139  K 4.0 3.9 3.9 3.1*  --  4.0  CL 105 105 102 104  --  105  CO2 23 23 18* 24  --  23  GLUCOSE 97 87 72 82  --  76  BUN 23 21 14 10   --  10  CREATININE 1.29 1.28 1.10 1.15  --  1.15  CALCIUM 8.5 8.5 8.7 8.6  --  8.9  MG  --   --   --   --  1.9  --    Liver Function Tests:  Recent Labs Lab 04/16/13 1832  AST 25  ALT 20  ALKPHOS 45  BILITOT 0.3  PROT 6.2  ALBUMIN 3.6   No results found for this basename: LIPASE, AMYLASE,  in the last 168 hours No results found for this basename: AMMONIA,  in the last 168 hours CBC:  Recent Labs Lab 04/16/13 1832  04/18/13 2210 04/19/13 0540 04/20/13 0413 04/21/13 0405 04/22/13 0540  WBC 8.9  < > 10.5 10.7* 7.9 7.0 7.0  NEUTROABS 7.0  --   --   --   --   --   --   HGB 6.1*  < > 8.7* 9.0* 8.4* 8.5* 9.0*  HCT 19.4*  < > 26.6* 28.2* 25.9* 26.5* 28.5*  MCV 79.5  < > 79.9 81.0 80.9 81.3 82.8  PLT 427*  < > 339 381 390 402* 424*  < > = values in this interval not  displayed. Cardiac Enzymes:  Recent Labs Lab 04/16/13 2020 04/17/13 0210 04/17/13 0825  CKTOTAL  --  256* 291*  CKMB  --  41.2* 49.7*  TROPONINI 0.89* 4.33* 3.26*   BNP: BNP (last 3 results)  Recent Labs  04/17/13 0825  PROBNP 4224.0*   CBG: No results found for this basename: GLUCAP,  in the last 168 hours     Signed:  Marinda Elk  [facility] 04/22/2013, 8:07 AM

## 2013-04-23 LAB — CBC AND DIFFERENTIAL
HCT: 29 % — AB (ref 41–53)
Hemoglobin: 9.7 g/dL — AB (ref 13.5–17.5)
Platelets: 484 10*3/uL — AB (ref 150–399)

## 2013-04-23 LAB — BASIC METABOLIC PANEL
BUN: 13 mg/dL (ref 4–21)
Sodium: 141 mmol/L (ref 137–147)

## 2013-04-24 ENCOUNTER — Encounter: Payer: Self-pay | Admitting: Nurse Practitioner

## 2013-04-24 ENCOUNTER — Non-Acute Institutional Stay (SKILLED_NURSING_FACILITY): Payer: Medicare Other | Admitting: Nurse Practitioner

## 2013-04-24 DIAGNOSIS — R609 Edema, unspecified: Secondary | ICD-10-CM

## 2013-04-24 DIAGNOSIS — R7989 Other specified abnormal findings of blood chemistry: Secondary | ICD-10-CM

## 2013-04-24 DIAGNOSIS — I739 Peripheral vascular disease, unspecified: Secondary | ICD-10-CM

## 2013-04-24 DIAGNOSIS — N138 Other obstructive and reflux uropathy: Secondary | ICD-10-CM

## 2013-04-24 DIAGNOSIS — F329 Major depressive disorder, single episode, unspecified: Secondary | ICD-10-CM

## 2013-04-24 DIAGNOSIS — R531 Weakness: Secondary | ICD-10-CM

## 2013-04-24 DIAGNOSIS — I959 Hypotension, unspecified: Secondary | ICD-10-CM

## 2013-04-24 DIAGNOSIS — F32A Depression, unspecified: Secondary | ICD-10-CM

## 2013-04-24 DIAGNOSIS — G4700 Insomnia, unspecified: Secondary | ICD-10-CM

## 2013-04-24 DIAGNOSIS — I350 Nonrheumatic aortic (valve) stenosis: Secondary | ICD-10-CM

## 2013-04-24 DIAGNOSIS — I359 Nonrheumatic aortic valve disorder, unspecified: Secondary | ICD-10-CM

## 2013-04-24 DIAGNOSIS — K5900 Constipation, unspecified: Secondary | ICD-10-CM

## 2013-04-24 DIAGNOSIS — N401 Enlarged prostate with lower urinary tract symptoms: Secondary | ICD-10-CM

## 2013-04-24 DIAGNOSIS — G609 Hereditary and idiopathic neuropathy, unspecified: Secondary | ICD-10-CM

## 2013-04-24 DIAGNOSIS — D649 Anemia, unspecified: Secondary | ICD-10-CM

## 2013-04-24 DIAGNOSIS — R5381 Other malaise: Secondary | ICD-10-CM

## 2013-04-24 DIAGNOSIS — I5031 Acute diastolic (congestive) heart failure: Secondary | ICD-10-CM

## 2013-04-24 DIAGNOSIS — K922 Gastrointestinal hemorrhage, unspecified: Secondary | ICD-10-CM

## 2013-04-24 NOTE — Assessment & Plan Note (Signed)
Takes Ambien nightly for sleep/rest.

## 2013-04-24 NOTE — Assessment & Plan Note (Signed)
Had EGD and Colonoscope--f/u GI

## 2013-04-24 NOTE — Assessment & Plan Note (Signed)
Takes Flomax, dribbles, s/p proctectomy for prostate cancer.

## 2013-04-24 NOTE — Assessment & Plan Note (Signed)
Chronic heart murmurs.

## 2013-04-24 NOTE — Progress Notes (Signed)
Patient ID: Justin Deleon, male   DOB: 07-Mar-1926, 79 y.o.   MRN: [id number] Code Status: DNR  No Known Allergies  Chief Complaint  Patient presents with  . Medical Managment of Chronic Issues    GI bleed.   Marland Kitchen Hospitalization Follow-up    HPI: Patient is a 79 y.o. male seen in the SNF at [facility] today for evaluation of s/p hospitalization for GI bleed and other chronic medical conditions. He has a  history of prostate cancer, hypertension, cataracts, GERD, peripheral vascular disease, heart murmur, and hypertension. He saw Dr. Chilton Si on 04/14/2013 for hemoglobin of 8.4 on 04/06/2013. He was referred to Dr. Laural Benes checked patient's labs and he was anemic as a result was instructed to have the patient come to the ED. In the ED, his hemoglobin of 6.1. He was found to have aheme-occult positive stools and reddish blood streaks - blood streaks resolved. EGD negative for bleed, colonoscopy: - internal and external hemorrhoid.Questionable mild radiation proctitis.. sigmoid and distal descending diverticuli and rare hepatic flexure diverticula. Questionably a few small cecal and ascending colon AVMs. S/p PRBC 4 units and Fe started after 2x IV Fe. Hgb 8-9 upon discharge. Repeated Hgb was 9s in SNF @[facility] . Elevated troponin  due to demand from anemia-f/u Cards.   Problem List Items Addressed This Visit   Acute diastolic heart failure - Primary     Well compensated clinically, no apparent edema or SOB    Anemia, unspecified     s/p PRBC transfusion. Hgb 9s 04/23/13 checked @ SNF [facility]    Calcific aortic stenosis     Chronic heart murmurs.     Depression (emotion)     Managed with Wellbutrin. Tearful when he mentioned his decreased wife.     Edema     Not apparent.     Elevated troponin     Thought was related to anemia demand--f/u Cards.     Generalized weakness     He is doing better after 2 days in SNF-ambulates with walker and independent ADLs-trial of IL @ [facility] with continuation of  in home Therapy.     GI bleed     Had EGD and Colonoscope--f/u GI    Hypertrophy of prostate with urinary obstruction and other lower urinary tract symptoms (LUTS)     Takes Flomax, dribbles, s/p proctectomy for prostate cancer.     Hypotension, unspecified     Well controlled w/o antihypertensive agent.     Insomnia     Takes Ambien nightly for sleep/rest.     Peripheral vascular disease, unspecified     Stable and no open wounds.     Unspecified constipation     Controlled on Colace, Polyethylene, and Senna.     Unspecified hereditary and idiopathic peripheral neuropathy     Pain is managed with Gabapentin.        Review of Systems:  Review of Systems  Constitutional: Positive for malaise/fatigue. Negative for fever, chills, weight loss and diaphoresis.  HENT: Positive for hearing loss. Negative for congestion, ear discharge, ear pain, nosebleeds, sore throat and tinnitus.   Eyes: Negative for blurred vision, double vision, photophobia, pain, discharge and redness.  Respiratory: Negative for cough, hemoptysis, sputum production, shortness of breath, wheezing and stridor.   Cardiovascular: Negative for chest pain, palpitations, orthopnea, claudication, leg swelling and PND.  Gastrointestinal: Positive for melena. Negative for heartburn, nausea, vomiting, abdominal pain, diarrhea, constipation and blood in stool.  Genitourinary: Positive for frequency. Negative for  dysuria, urgency, hematuria and flank pain.  Musculoskeletal: Negative for back pain, falls, joint pain, myalgias and neck pain.  Skin: Negative for itching and rash.  Neurological: Negative for dizziness, tingling, tremors, sensory change, speech change, focal weakness, seizures, loss of consciousness and weakness.  Endo/Heme/Allergies: Negative for environmental allergies and polydipsia. Does not bruise/bleed easily.  Psychiatric/Behavioral: Positive for depression and memory loss. Negative for suicidal ideas,  hallucinations and substance abuse. The patient has insomnia. The patient is not nervous/anxious.      Past Medical History  Diagnosis Date  . Depressed   . Hernia   . Kidney stones   . Hypertension   . Prostate cancer   . Cataract 2012    Mohs MD  . Hypertrophy of prostate with urinary obstruction and other lower urinary tract symptoms (LUTS) 08/26/2012  . Edema 08/26/2012  . Ectropion, unspecified 05/27/2012  . Abnormality of gait 11/27/2011  . Unspecified hereditary and idiopathic peripheral neuropathy 08/28/2011  . Basal cell carcinoma of scalp and skin of neck 06/26/2011  . Pain in limb 05/28/2011  . Closed fracture of midcervical section of femur 05/28/2011  . Other and unspecified hyperlipidemia 04/06/2011  . Anemia, unspecified 04/06/2011  . Peripheral vascular disease, unspecified 04/06/2011  . Reflux esophagitis 04/06/2011  . Unspecified constipation 04/06/2011  . Insomnia, unspecified 04/06/2011  . Undiagnosed cardiac murmurs 04/06/2011  . Hypertrophy of prostate with urinary obstruction and other lower urinary tract symptoms (LUTS) 2013  . Depression, acute   . Aortic valve stenosis, rheumatic    Past Surgical History  Procedure Laterality Date  . Cholecystectomy  1963/1978  . Hernia repair  2008    umbilical  . Spine surgery  2010    lumbar-L3-4/L4-5  . Fracture surgery Left 03/2011    hip  . Eye surgery      retina repair right  . Esophagogastroduodenoscopy N/A 04/17/2013    Procedure: ESOPHAGOGASTRODUODENOSCOPY (EGD);  Surgeon: Graylin Shiver, MD;  Location: [facility] ENDOSCOPY;  Service: Endoscopy;  Laterality: N/A;  . Colonoscopy N/A 04/21/2013    Procedure: COLONOSCOPY;  Surgeon: Petra Kuba, MD;  Location: [facility] ENDOSCOPY;  Service: Endoscopy;  Laterality: N/A;   Social History:   reports that he quit smoking about 25 years ago. His smoking use included Cigars. He has never used smokeless tobacco. He reports that he drinks about 0.6 ounces of alcohol per week. He reports  that he does not use illicit drugs.  Family History  Problem Relation Age of Onset  . Heart disease Father     MI  . Cancer Sister     bladder    Medications: Patient's Medications  New Prescriptions   No medications on file  Previous Medications   ASCORBIC ACID (VITAMIN C) 1000 MG TABLET    Take 1,000 mg by mouth 2 (two) times daily.   BUPROPION (WELLBUTRIN XL) 300 MG 24 HR TABLET    Take 300 mg by mouth every morning.   CHOLECALCIFEROL (VITAMIN D) 1000 UNITS TABLET    Take 3,000 Units by mouth daily.   DOCUSATE SODIUM (COLACE) 100 MG CAPSULE    Take 300 mg by mouth daily.    FERROUS SULFATE 325 (65 FE) MG TABLET    Take 325 mg by mouth daily with breakfast. Take 1 daily to treat anemia.   FERROUS SULFATE 325 (65 FE) MG TABLET    Take 1 tablet (325 mg total) by mouth daily with breakfast.   GABAPENTIN (NEURONTIN) 300 MG CAPSULE    Take  1,500 mg by mouth at bedtime.   MULTIPLE VITAMINS-MINERALS (MULTIVITAMIN WITH MINERALS) TABLET    Take 1 tablet by mouth as directed.    MULTIPLE VITAMINS-MINERALS (PRESERVISION AREDS) CAPS    Take 2 capsules by mouth daily.   POLYETHYL GLYCOL-PROPYL GLYCOL (SYSTANE) 0.4-0.3 % SOLN    Apply to eye. One drop 4 times daily as needed for dry eyes   POLYETHYLENE GLYCOL POWDER (GLYCOLAX/MIRALAX) POWDER    Take 17 g by mouth daily. Mix 17 grams with 8 oz. of water daily for laxative.   POTASSIUM CHLORIDE (K-DUR,KLOR-CON) 10 MEQ TABLET    Take 10 mEq by mouth daily.   RED YEAST RICE 600 MG TABS    Take 3 tablets by mouth daily.    SENNA-DOCUSATE (SENNA PLUS) 8.6-50 MG PER TABLET    Take 3 tablets by mouth daily.    TAMSULOSIN (FLOMAX) 0.4 MG CAPS    Take one capsule by mouth once daily   VITAMIN E (VITAMIN E) 1000 UNIT CAPSULE    Take 1,000 Units by mouth daily. Take 1 capsule daily for vitamin E supplement.   VITAMIN E 400 UNIT CAPSULE    Take 400 Units by mouth daily.   ZOLPIDEM TARTRATE 10 MG SUBL    Take one tablet by mouth at bedtime as needed for rest.   Modified Medications   No medications on file  Discontinued Medications   No medications on file     Physical Exam: Physical Exam  Constitutional: He is oriented to person, place, and time. He appears well-developed and well-nourished. No distress.  HENT:  Head: Normocephalic and atraumatic.  Right Ear: External ear normal.  Left Ear: External ear normal.  Nose: Nose normal.  Mouth/Throat: Oropharynx is clear and moist. No oropharyngeal exudate.  Eyes: Conjunctivae and EOM are normal. Pupils are equal, round, and reactive to light. Right eye exhibits no discharge. Left eye exhibits no discharge. No scleral icterus.  Neck: Normal range of motion. Neck supple. No JVD present. No tracheal deviation present. No thyromegaly present.  Cardiovascular: Normal rate and regular rhythm.   Murmur heard.  Systolic murmur is present with a grade of 3/6  Pulmonary/Chest: Effort normal and breath sounds normal. No stridor. No respiratory distress. He has no wheezes. He has no rales. He exhibits no tenderness.  Abdominal: Soft. Bowel sounds are normal. He exhibits no distension and no mass. There is no tenderness. There is no rebound and no guarding.  Musculoskeletal: Normal range of motion. He exhibits no edema and no tenderness.  Ambulates with walker.   Lymphadenopathy:    He has no cervical adenopathy.  Neurological: He is alert and oriented to person, place, and time. He has normal reflexes. He displays normal reflexes. No cranial nerve deficit. He exhibits normal muscle tone. Coordination normal.  Skin: Skin is warm and dry. No rash noted. He is not diaphoretic. No erythema. No pallor.  Psychiatric: His behavior is normal. His mood appears not anxious. His affect is not angry, not blunt, not labile and not inappropriate. His speech is not rapid and/or pressured, not delayed and not slurred. Cognition and memory are impaired. He does not express impulsivity or inappropriate judgment. He exhibits a  depressed mood. He is communicative. He exhibits abnormal recent memory. He exhibits normal remote memory.    Filed Vitals:   04/24/13 1449  BP: 116/74  Pulse: 68  Temp: 98 F (36.7 C)  TempSrc: Tympanic  Resp: 20      Labs reviewed: Basic  Metabolic Panel:  Recent Labs  14/78/29 0540 04/21/13 0405 04/21/13 0930 04/22/13 0540 04/23/13  NA 137 139  --  139 141  K 3.9 3.1*  --  4.0 4.3  CL 102 104  --  105  --   CO2 18* 24  --  23  --   GLUCOSE 72 82  --  76  --   BUN 14 10  --  10 13  CREATININE 1.10 1.15  --  1.15 1.2  CALCIUM 8.7 8.6  --  8.9  --   MG  --   --  1.9  --   --    Liver Function Tests:  Recent Labs  04/16/13 1832  AST 25  ALT 20  ALKPHOS 45  BILITOT 0.3  PROT 6.2  ALBUMIN 3.6   CBC:  Recent Labs  04/16/13 1832  04/20/13 0413 04/21/13 0405 04/22/13 0540 04/23/13  WBC 8.9  < > 7.9 7.0 7.0 8.1  NEUTROABS 7.0  --   --   --   --   --   HGB 6.1*  < > 8.4* 8.5* 9.0* 9.7*  HCT 19.4*  < > 25.9* 26.5* 28.5* 29*  MCV 79.5  < > 80.9 81.3 82.8  --   PLT 427*  < > 390 402* 424* 484*  < > = values in this interval not displayed. Past Procedures:  04/17/13 Echocardiogram: EF 55-60%  Assessment/Plan Acute diastolic heart failure Well compensated clinically, no apparent edema or SOB  Anemia, unspecified s/p PRBC transfusion. Hgb 9s 04/23/13 checked @ SNF [facility]  Calcific aortic stenosis Chronic heart murmurs.   Edema Not apparent.   Elevated troponin Thought was related to anemia demand--f/u Cards.   Generalized weakness He is doing better after 2 days in SNF-ambulates with walker and independent ADLs-trial of IL @ [facility] with continuation of in home Therapy.   GI bleed Had EGD and Colonoscope--f/u GI  Hypertrophy of prostate with urinary obstruction and other lower urinary tract symptoms (LUTS) Takes Flomax, dribbles, s/p proctectomy for prostate cancer.   Hypotension, unspecified Well controlled w/o antihypertensive agent.    Peripheral vascular disease, unspecified Stable and no open wounds.   Unspecified hereditary and idiopathic peripheral neuropathy Pain is managed with Gabapentin.   Unspecified constipation Controlled on Colace, Polyethylene, and Senna.   Depression (emotion) Managed with Wellbutrin. Tearful when he mentioned his decreased wife.   Insomnia Takes Ambien nightly for sleep/rest.     Family/ Staff Communication: may return to IL @ [facility]  Goals of Care: IL  Labs/tests ordered: CBC, CMP done 04/23/13

## 2013-04-24 NOTE — Assessment & Plan Note (Signed)
Well compensated clinically, no apparent edema or SOB

## 2013-04-24 NOTE — Assessment & Plan Note (Signed)
Stable and no open wounds.

## 2013-04-24 NOTE — Assessment & Plan Note (Signed)
He is doing better after 2 days in SNF-ambulates with walker and independent ADLs-trial of IL @ [facility] with continuation of in home Therapy.

## 2013-04-24 NOTE — Assessment & Plan Note (Signed)
Pain is managed with Gabapentin.

## 2013-04-24 NOTE — Assessment & Plan Note (Signed)
Managed with Wellbutrin. Tearful when he mentioned his decreased wife.

## 2013-04-24 NOTE — Assessment & Plan Note (Addendum)
Controlled on Colace, Polyethylene, and Senna.

## 2013-04-24 NOTE — Assessment & Plan Note (Signed)
Thought was related to anemia demand--f/u Cards.

## 2013-04-24 NOTE — Assessment & Plan Note (Addendum)
s/p PRBC transfusion. Hgb 9s 04/23/13 checked @ SNF [facility]

## 2013-04-24 NOTE — Assessment & Plan Note (Signed)
Not apparent.  

## 2013-04-24 NOTE — Assessment & Plan Note (Signed)
Well controlled w/o antihypertensive agent.

## 2013-04-27 NOTE — Telephone Encounter (Signed)
It appears rx was written on 04/22/13 by Marinda Elk, MD

## 2013-04-28 ENCOUNTER — Non-Acute Institutional Stay: Payer: Medicare Other | Admitting: Internal Medicine

## 2013-04-28 ENCOUNTER — Encounter: Payer: Self-pay | Admitting: Internal Medicine

## 2013-04-28 VITALS — BP 118/60 | HR 76 | Ht 70.0 in | Wt 178.0 lb

## 2013-04-28 DIAGNOSIS — I350 Nonrheumatic aortic (valve) stenosis: Secondary | ICD-10-CM

## 2013-04-28 DIAGNOSIS — I359 Nonrheumatic aortic valve disorder, unspecified: Secondary | ICD-10-CM

## 2013-04-28 DIAGNOSIS — N289 Disorder of kidney and ureter, unspecified: Secondary | ICD-10-CM

## 2013-04-28 DIAGNOSIS — D649 Anemia, unspecified: Secondary | ICD-10-CM

## 2013-04-28 DIAGNOSIS — K922 Gastrointestinal hemorrhage, unspecified: Secondary | ICD-10-CM

## 2013-04-28 NOTE — Progress Notes (Signed)
Subjective:    Patient ID: Justin Deleon, male    DOB: [birth year], 79 y.o.   MRN: [id number]  Chief Complaint  Patient presents with  . Hospitalization Follow-up    GI bleed. In [facility] 04/16/13 to 04/22/13    HPI Post hospitalization for profound anemia from 04/16/13 to 04/22/13. No source of bleeding found. Was transfused blood. Had EGD and colonoscopy. Tranferred to [facility], but only stayed 2 days and then returned to his apt on 04/24/13. Says he has done fine. More energy than when last seen.  Current Outpatient Prescriptions on File Prior to Visit  Medication Sig Dispense Refill  . Ascorbic Acid (VITAMIN C) 1000 MG tablet Take 1,000 mg by mouth 2 (two) times daily.      Marland Kitchen buPROPion (WELLBUTRIN XL) 300 MG 24 hr tablet Take 300 mg by mouth every morning.      . cholecalciferol (VITAMIN D) 1000 UNITS tablet Take 3,000 Units by mouth daily.      Marland Kitchen docusate sodium (COLACE) 100 MG capsule Take 300 mg by mouth daily.       . ferrous sulfate 325 (65 FE) MG tablet Take 325 mg by mouth daily with breakfast. Take 1 daily to treat anemia.      Marland Kitchen gabapentin (NEURONTIN) 300 MG capsule Take 1,500 mg by mouth at bedtime.      . Multiple Vitamins-Minerals (MULTIVITAMIN WITH MINERALS) tablet Take 1 tablet by mouth as directed.       . Multiple Vitamins-Minerals (PRESERVISION AREDS) CAPS Take 2 capsules by mouth daily.      Bertram Gala Glycol-Propyl Glycol (SYSTANE) 0.4-0.3 % SOLN Apply to eye. One drop 4 times daily as needed for dry eyes      . polyethylene glycol powder (GLYCOLAX/MIRALAX) powder Take 17 g by mouth daily. Mix 17 grams with 8 oz. of water daily for laxative.      . potassium chloride (K-DUR,KLOR-CON) 10 MEQ tablet Take 10 mEq by mouth daily.      . Red Yeast Rice 600 MG TABS Take 3 tablets by mouth daily.       Marland Kitchen senna-docusate (SENNA PLUS) 8.6-50 MG per tablet Take 3 tablets by mouth daily.       . tamsulosin (FLOMAX) 0.4 MG CAPS Take one capsule by mouth once daily  30 capsule  6  .  vitamin E (VITAMIN E) 1000 UNIT capsule Take 1,000 Units by mouth daily. Take 1 capsule daily for vitamin E supplement.      . vitamin E 400 UNIT capsule Take 400 Units by mouth daily.      . Zolpidem Tartrate 10 MG SUBL Take one tablet by mouth at bedtime as needed for rest.  30 tablet  0   No current facility-administered medications on file prior to visit.    Review of Systems  Constitutional: Negative for fever, chills, diaphoresis, activity change, appetite change, fatigue and unexpected weight change.  HENT: Positive for hearing loss and rhinorrhea.   Eyes: Negative.   Respiratory: Negative for apnea, cough, choking, chest tightness and shortness of breath.   Cardiovascular: Negative for chest pain, palpitations and leg swelling.  Gastrointestinal: Negative.   Endocrine: Negative.   Genitourinary: Negative.   Musculoskeletal: Positive for arthralgias, back pain and gait problem.  Skin: Negative for color change, pallor, rash and wound.  Neurological: Negative.   Hematological: Bruises/bleeds easily.  Psychiatric/Behavioral: Positive for confusion.       Objective:BP 118/60  Pulse 76  Ht 5\' 10"  (1.778  m)  Wt 178 lb (80.74 kg)  BMI 25.54 kg/m2    Physical Exam  Constitutional: He is oriented to person, place, and time. No distress.  Frail elderly male  HENT:  Nose: Nose normal.  Mouth/Throat: No oropharyngeal exudate.  Bilateral loss of hearing  Eyes:  Corrective lenses  Neck: Normal range of motion. Neck supple. No JVD present. No tracheal deviation present. No thyromegaly present.  Cardiovascular: Normal rate and regular rhythm.  Exam reveals no gallop and no friction rub.   Murmur heard. Grade 3/6 cooing ejection murmur  Abdominal: Soft. Bowel sounds are normal. He exhibits no distension and no mass. There is no tenderness.  Genitourinary: Guaiac positive stool.  Prostate is absent. There is a 5mm firm, but not hard, nodule in the left lower side. Stool is a  maroon/ brown and heme positive.  Musculoskeletal: Normal range of motion. He exhibits edema. He exhibits no tenderness.  Lymphadenopathy:    He has no cervical adenopathy.  Neurological: He is alert and oriented to person, place, and time. No cranial nerve deficit. Coordination normal.  04/14/13 MMSE: 29/30. Failed clock drawing.  Skin:  Actinic keratosis. Bruises.  Psychiatric: He has a normal mood and affect. His behavior is normal. Thought content normal.  Some loss of memory. Poor historian.     Nursing Home on 04/24/2013  Component Date Value Range Status  . Hemoglobin 04/23/2013 9.7* 13.5 - 17.5 g/dL Final  . HCT 16/04/9603 29* 41 - 53 % Final  . Platelets 04/23/2013 484* 150 - 399 K/L Final  . WBC 04/23/2013 8.1   Final  . Glucose 04/23/2013 85   Final  . BUN 04/23/2013 13  4 - 21 mg/dL Final  . Creatinine 54/03/8118 1.2  0.6 - 1.3 mg/dL Final  . Potassium 14/78/2956 4.3  3.4 - 5.3 mmol/L Final  . Sodium 04/23/2013 141  137 - 147 mmol/L Final  Admission on 04/16/2013, Discharged on 04/22/2013  No results displayed because visit has over 200 results.           Assessment & Plan:  Moderate to severe aortic stenosis; asymptomatic  GI bleed: resolved  Renal insufficiency: recheck lab prior to next visit  Anemia, unspecified; recheck lab in a few weeks

## 2013-04-28 NOTE — Patient Instructions (Signed)
Continue current medications. 

## 2013-04-30 ENCOUNTER — Encounter: Payer: Self-pay | Admitting: Internal Medicine

## 2013-05-07 ENCOUNTER — Encounter: Payer: Self-pay | Admitting: Internal Medicine

## 2013-06-09 ENCOUNTER — Encounter: Payer: Self-pay | Admitting: Internal Medicine

## 2013-06-09 ENCOUNTER — Other Ambulatory Visit: Payer: Self-pay | Admitting: Internal Medicine

## 2013-06-09 ENCOUNTER — Non-Acute Institutional Stay: Payer: Medicare Other | Admitting: Internal Medicine

## 2013-06-09 VITALS — BP 114/62 | HR 68 | Ht 70.0 in | Wt 177.0 lb

## 2013-06-09 DIAGNOSIS — I350 Nonrheumatic aortic (valve) stenosis: Secondary | ICD-10-CM

## 2013-06-09 DIAGNOSIS — G609 Hereditary and idiopathic neuropathy, unspecified: Secondary | ICD-10-CM

## 2013-06-09 DIAGNOSIS — I359 Nonrheumatic aortic valve disorder, unspecified: Secondary | ICD-10-CM

## 2013-06-09 DIAGNOSIS — K922 Gastrointestinal hemorrhage, unspecified: Secondary | ICD-10-CM

## 2013-06-09 DIAGNOSIS — G4700 Insomnia, unspecified: Secondary | ICD-10-CM

## 2013-06-09 DIAGNOSIS — E559 Vitamin D deficiency, unspecified: Secondary | ICD-10-CM

## 2013-06-09 DIAGNOSIS — D649 Anemia, unspecified: Secondary | ICD-10-CM

## 2013-06-09 DIAGNOSIS — R609 Edema, unspecified: Secondary | ICD-10-CM

## 2013-06-09 MED ORDER — VITAMIN D (CHOLECALCIFEROL) 25 MCG (1000 UT) PO TABS: ORAL_TABLET | ORAL | Status: AC

## 2013-06-09 MED ORDER — PREGABALIN 50 MG PO CAPS: ORAL_CAPSULE | ORAL | Status: DC

## 2013-06-09 NOTE — Patient Instructions (Signed)
Stop KCL and Colace. Use up Vit C and stop. Stop gabapentin and start Lyrica. Continue walks and exercises.

## 2013-06-09 NOTE — Progress Notes (Signed)
Subjective:    Patient ID: Justin Deleon, male    DOB: Nov 25, 1925, 79 y.o.   MRN: [id number]  Chief Complaint  Patient presents with  . Medical Managment of Chronic Issues    anemia, Renal insufficiency , with daughter Thurston Hole    HPI Using 6 or 7 gabapentin 300 mg nightly. Says 5 do not work. Can't sleep unless he takes the additional doses.  Current Outpatient Prescriptions on File Prior to Visit  Medication Sig Dispense Refill  . Ascorbic Acid (VITAMIN C) 1000 MG tablet Take 1,000 mg by mouth 2 (two) times daily.      Marland Kitchen buPROPion (WELLBUTRIN XL) 300 MG 24 hr tablet Take 300 mg by mouth every morning.      . cholecalciferol (VITAMIN D) 1000 UNITS tablet Take 3,000 Units by mouth daily.      . ferrous sulfate 325 (65 FE) MG tablet Take 325 mg by mouth daily with breakfast. Take 1 daily to treat anemia.      Marland Kitchen gabapentin (NEURONTIN) 300 MG capsule Take 1,500 mg by mouth at bedtime.      . Multiple Vitamins-Minerals (MULTIVITAMIN WITH MINERALS) tablet Take 1 tablet by mouth as directed.       . Multiple Vitamins-Minerals (PRESERVISION AREDS) CAPS Take 2 capsules by mouth daily.      Bertram Gala Glycol-Propyl Glycol (SYSTANE) 0.4-0.3 % SOLN Apply to eye. One drop 4 times daily as needed for dry eyes      . polyethylene glycol powder (GLYCOLAX/MIRALAX) powder Take 17 g by mouth daily. Mix 17 grams with 8 oz. of water daily for laxative.      . potassium chloride (K-DUR,KLOR-CON) 10 MEQ tablet Take 10 mEq by mouth daily.      . Red Yeast Rice 600 MG TABS Take 3 tablets by mouth daily.       Marland Kitchen senna-docusate (SENNA PLUS) 8.6-50 MG per tablet Take 3 tablets by mouth daily.       . tamsulosin (FLOMAX) 0.4 MG CAPS Take one capsule by mouth once daily  30 capsule  6  . Zolpidem Tartrate 10 MG SUBL Take one tablet by mouth at bedtime as needed for rest.  30 tablet  0   No current facility-administered medications on file prior to visit.    Review of Systems  Constitutional: Negative for  fever, chills, diaphoresis, activity change, appetite change, fatigue and unexpected weight change.  HENT: Positive for hearing loss and rhinorrhea.   Eyes: Negative.   Respiratory: Negative for apnea, cough, choking, chest tightness and shortness of breath.   Cardiovascular: Negative for chest pain, palpitations and leg swelling.  Gastrointestinal: Negative.   Endocrine: Negative.   Genitourinary: Negative.   Musculoskeletal: Positive for arthralgias, back pain and gait problem.  Skin: Negative for color change, pallor, rash and wound.  Neurological: Negative.   Hematological: Bruises/bleeds easily.  Psychiatric/Behavioral: Positive for confusion.       Objective:   Physical Exam  Constitutional: He is oriented to person, place, and time. No distress.  Frail elderly male  HENT:  Nose: Nose normal.  Mouth/Throat: No oropharyngeal exudate.  Bilateral loss of hearing  Eyes:  Corrective lenses  Neck: Normal range of motion. Neck supple. No JVD present. No tracheal deviation present. No thyromegaly present.  Cardiovascular: Normal rate and regular rhythm.  Exam reveals no gallop and no friction rub.   Murmur heard. Grade 3/6 cooing ejection murmur  Pulmonary/Chest: No respiratory distress. He has no wheezes. He has no  rales.  Abdominal: Soft. Bowel sounds are normal. He exhibits no distension and no mass. There is no tenderness.  Genitourinary: Guaiac positive stool.  Prostate is absent. There is a 5mm firm, but not hard, nodule in the left lower side. Stool is a maroon/ brown and heme positive.  Musculoskeletal: Normal range of motion. He exhibits edema. He exhibits no tenderness.  Lymphadenopathy:    He has no cervical adenopathy.  Neurological: He is alert and oriented to person, place, and time. No cranial nerve deficit. Coordination normal.  04/14/13 MMSE: 29/30. Failed clock drawing.  Skin: No rash noted. No erythema. No pallor.  Actinic keratosis. Bruises.  Psychiatric: He  has a normal mood and affect. His behavior is normal. Thought content normal.  Some loss of memory. Poor historian.      LAB REVIEW 06/01/13 CBC: Hgb 12.1  CMP: normal except creat 1.37    Assessment & Plan:  Unspecified hereditary and idiopathic peripheral neuropathy: try lyrica instead of gabapentin  Unspecified vitamin D deficiency - Plan: Vitamin D, Cholecalciferol, 1000 UNITS TABS  Moderate to severe aortic stenosis: stable  Calcific aortic stenosis: unchanged  GI bleed: resolved  Anemia: improved  Edema: improved  Insomnia:due to pain

## 2013-06-23 ENCOUNTER — Encounter: Payer: Self-pay | Admitting: Internal Medicine

## 2013-06-23 ENCOUNTER — Non-Acute Institutional Stay: Payer: Medicare Other | Admitting: Internal Medicine

## 2013-06-23 VITALS — BP 142/80 | HR 76 | Temp 98.0°F | Wt 178.0 lb

## 2013-06-23 DIAGNOSIS — J069 Acute upper respiratory infection, unspecified: Secondary | ICD-10-CM | POA: Insufficient documentation

## 2013-06-23 DIAGNOSIS — G609 Hereditary and idiopathic neuropathy, unspecified: Secondary | ICD-10-CM

## 2013-06-23 DIAGNOSIS — R609 Edema, unspecified: Secondary | ICD-10-CM

## 2013-06-23 IMAGING — RF DG HIP OPERATIVE*L*
1 series · 2 of 2 positions shown · non-contrast
Comparison: Earlier today

CLINICAL DATA: Left hip fracture

OPERATIVE LEFT HIP

[Series 1: run · 2 of 2 slices shown]
[im 1/2]
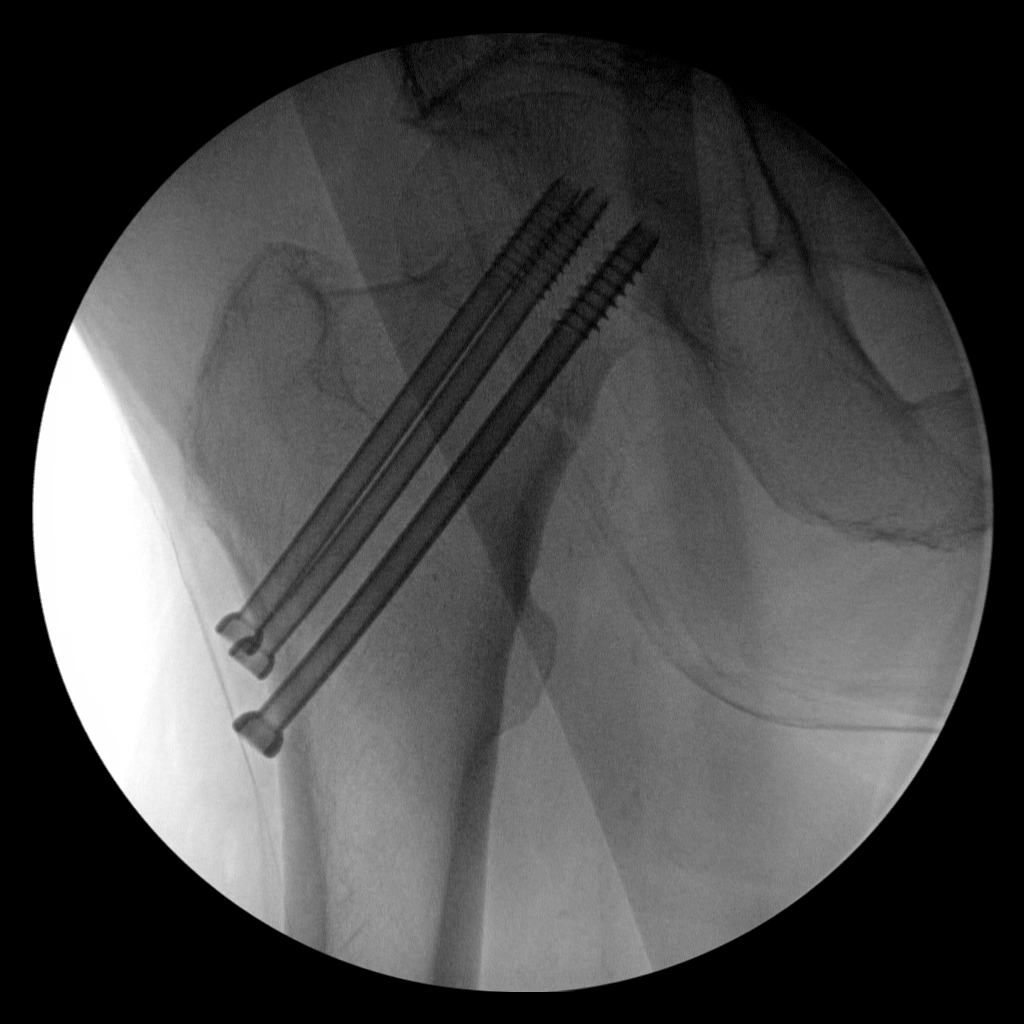
[im 2/2]
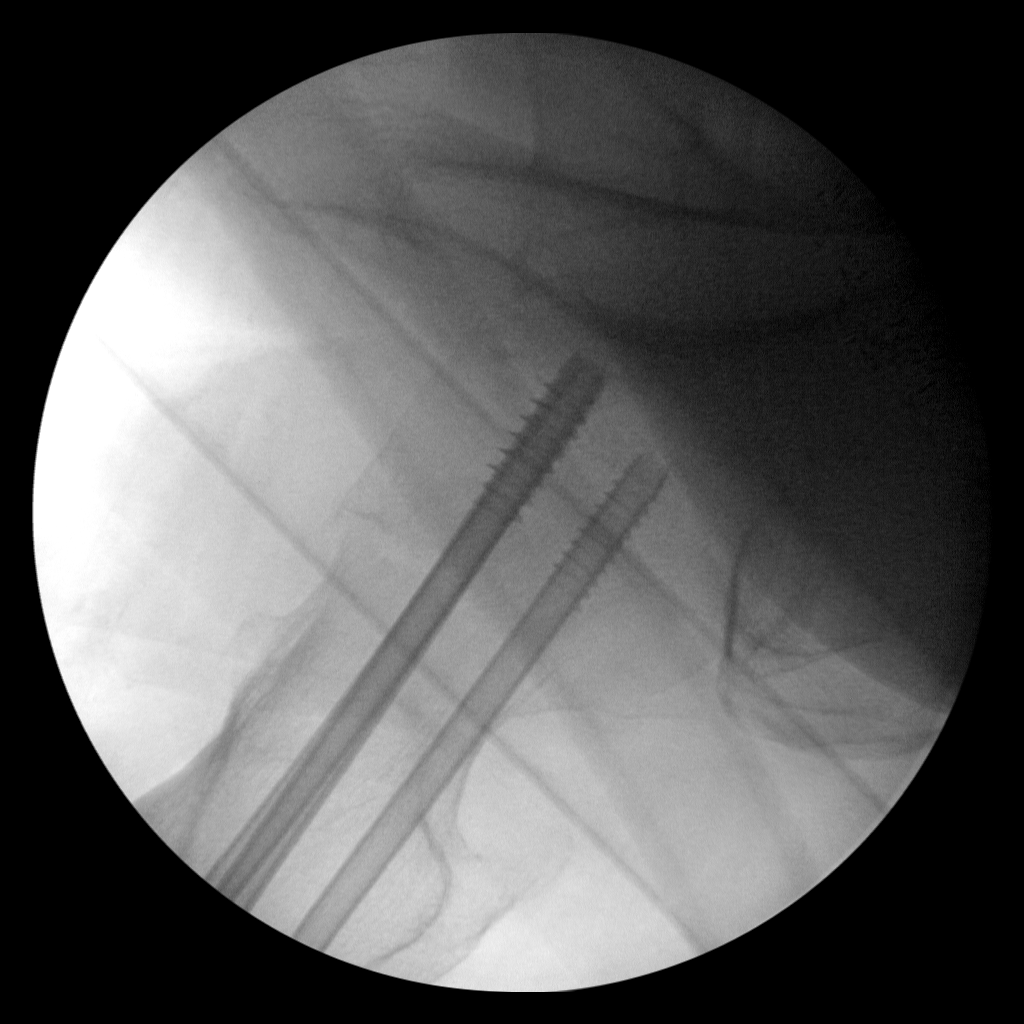

[2 of 2 positions shown; findings below may reference images not displayed]

FINDINGS: Status post open reduction and internal fixation of
subcapital femoral neck fracture.  3 Makoto Venegas are identified
within the proximal left femur.  The fracture fragments appear to
be in near anatomic alignment, similar to previous exam.
IMPRESSION: 1.  Status post placement of three Makoto Venegas within the left
hip.

## 2013-06-23 IMAGING — CR DG HIP 1V*L*
2 series · 2 of 2 positions shown · non-contrast
Comparison: None.

CLINICAL DATA: Left hip pain status post fall

LEFT HIP - 1 VIEW:

[t hip ap left]
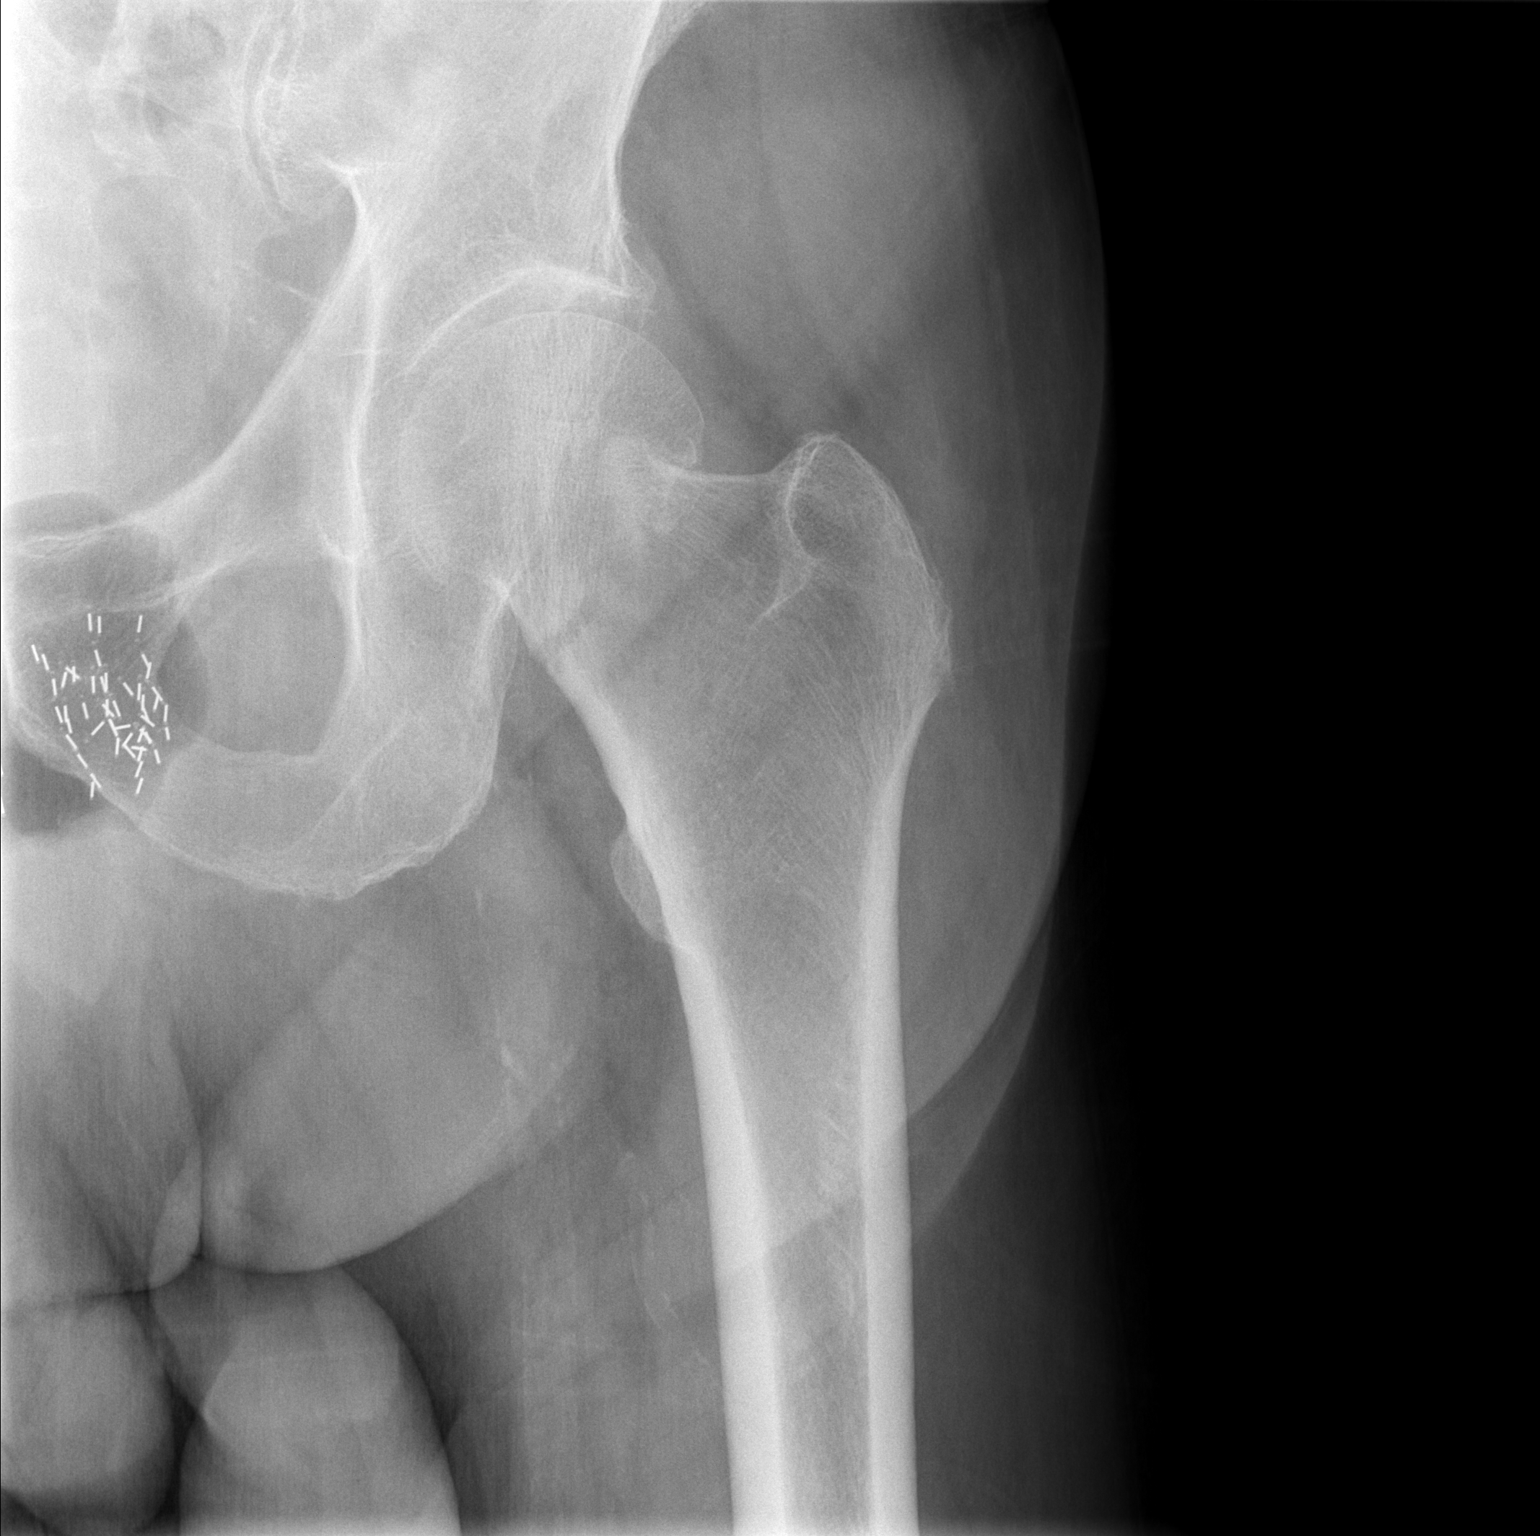

[t hip frog leg left]
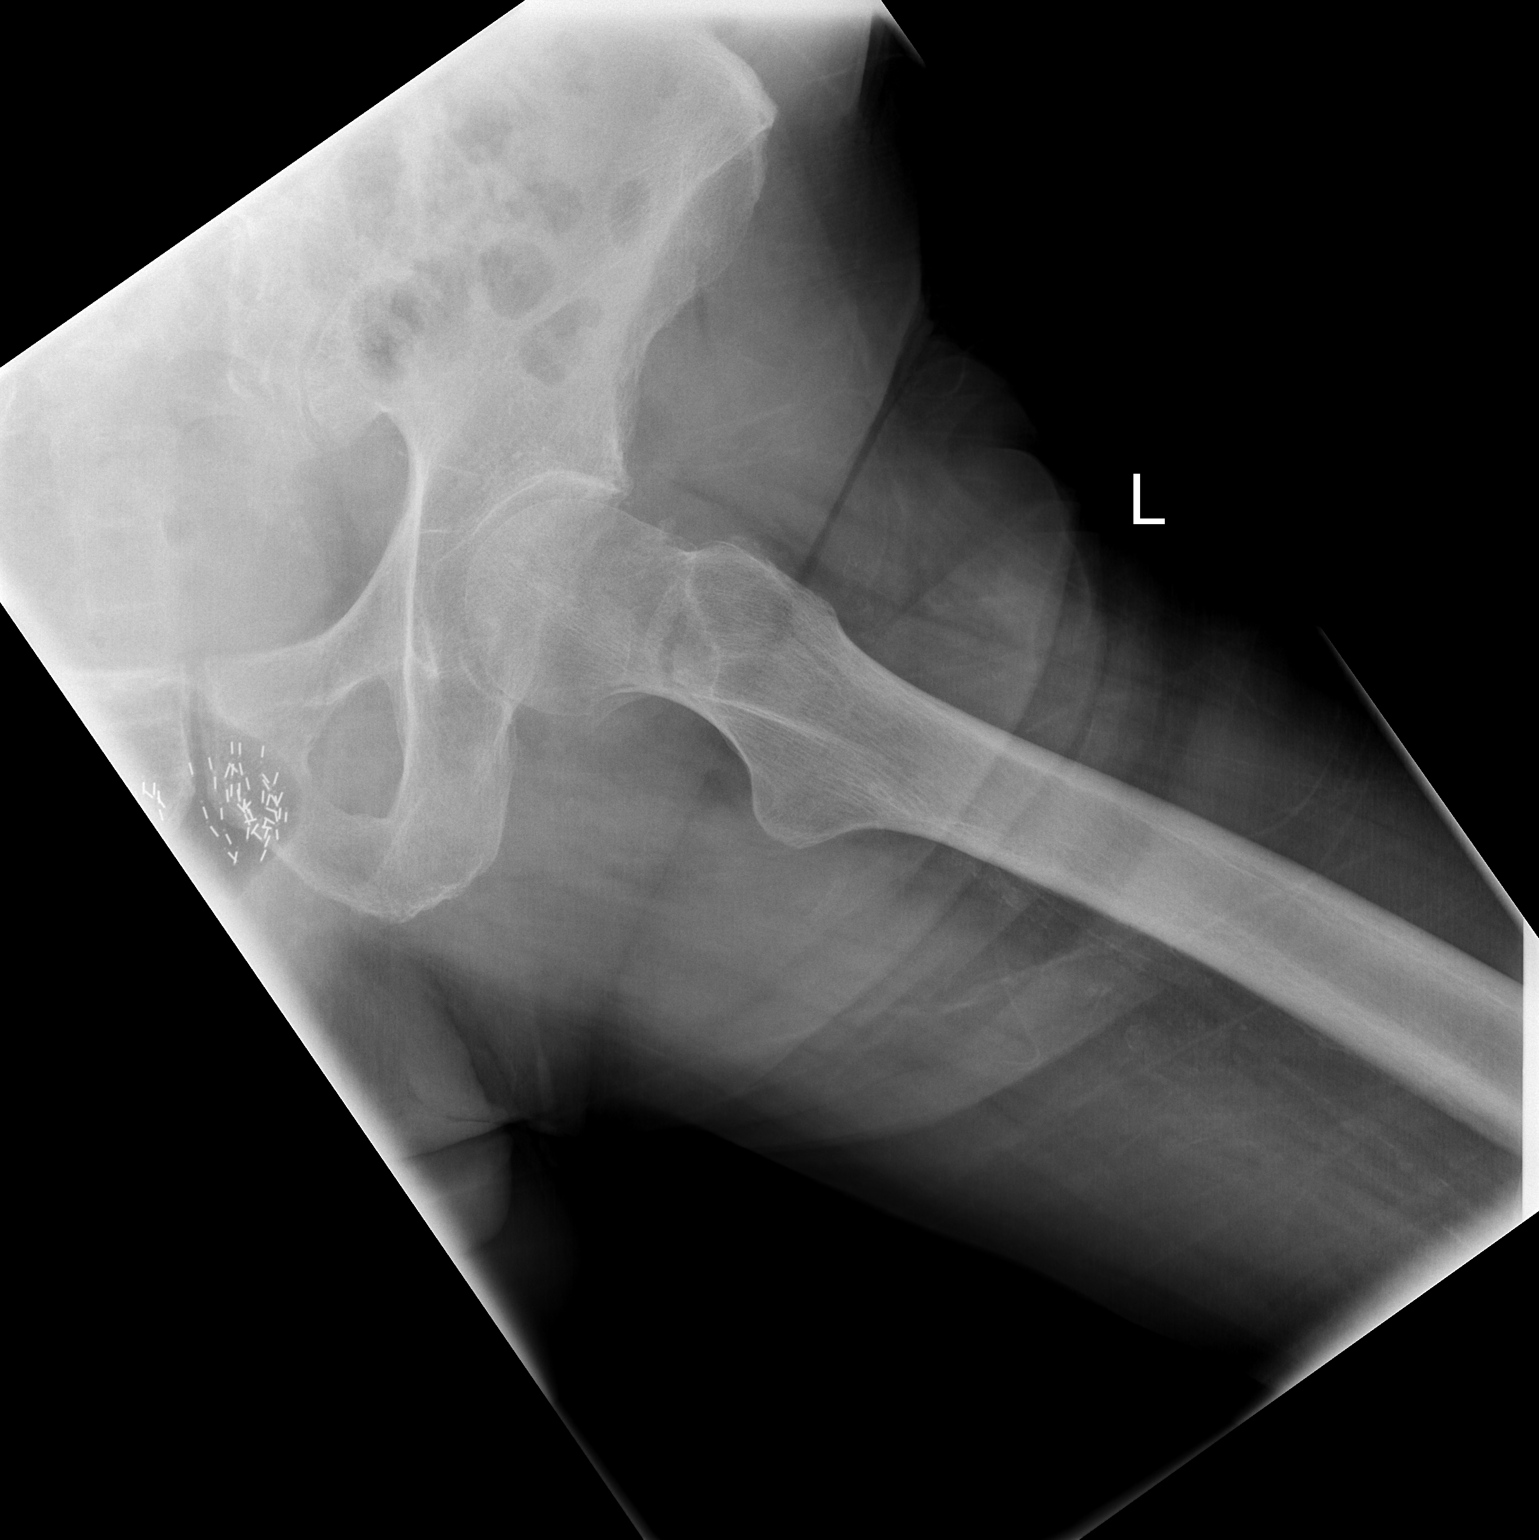

[2 of 2 positions shown; findings below may reference images not displayed]

FINDINGS: Lucency through the femoral neck is concerning for
minimal displaced fracture. No dislocation.  Vascular
atherosclerotic calcifications.  Metallic clips project over the
lower pelvis.
IMPRESSION: Minimally displaced left femoral neck fracture.

## 2013-06-23 MED ORDER — GABAPENTIN 300 MG PO CAPS: ORAL_CAPSULE | ORAL | Status: DC

## 2013-06-23 NOTE — Progress Notes (Signed)
Patient ID: Justin Deleon, male   DOB: 06-28-1926, 79 y.o.   MRN: [id number]    Location:  [facility]   Place of Service: Clinic (12)    No Known Allergies  Chief Complaint  Patient presents with  . Medication Problem    Lyrica cause him to be constipated and abdominal pain after 3 days. Stopped it and it cleared up. Resumed Gabapentin 300mg , taking only 5 capsules at bedtime. (was aking more prior to stopping)  . Nasal Congestion    for 4 days.    HPI:  Unspecified hereditary and idiopathic peripheral neuropathy: he is back on gabapentin and believes he can limit himself to 1500 mg nightly with satisfactory relief  Edema: resolved  URTI (acute upper respiratory infection): sick about a week with rhinorrhea and congestion with cough    Medications: Patient's Medications  New Prescriptions   No medications on file  Previous Medications   BUPROPION (WELLBUTRIN XL) 300 MG 24 HR TABLET    Take 300 mg by mouth every morning.   FERROUS SULFATE 325 (65 FE) MG TABLET    Take 325 mg by mouth daily with breakfast. Take 1 daily to treat anemia.   GABAPENTIN (NEURONTIN) 300 MG CAPSULE    Take 300 mg by mouth. Take 5 capsules at bedtime   MULTIPLE VITAMINS-MINERALS (MULTIVITAMIN WITH MINERALS) TABLET    Take 1 tablet by mouth as directed.    MULTIPLE VITAMINS-MINERALS (PRESERVISION AREDS) CAPS    Take 2 capsules by mouth daily.   OMEPRAZOLE MAGNESIUM (PRILOSEC OTC PO)    Take by mouth. Take one daily   POLYETHYL GLYCOL-PROPYL GLYCOL (SYSTANE) 0.4-0.3 % SOLN    Apply to eye. One drop 4 times daily as needed for dry eyes   POLYETHYLENE GLYCOL POWDER (GLYCOLAX/MIRALAX) POWDER    Take 17 g by mouth daily. Mix 17 grams with 8 oz. of water daily for laxative.   RED YEAST RICE 600 MG TABS    Take 3 tablets by mouth daily.    SENNA-DOCUSATE (SENNA PLUS) 8.6-50 MG PER TABLET    Take 3 tablets by mouth daily.    TAMSULOSIN (FLOMAX) 0.4 MG CAPS    Take one capsule by mouth once daily   VITAMIN D, CHOLECALCIFEROL, 1000 UNITS TABS    2 daily for vitamin D supplementation   ZOLPIDEM TARTRATE 10 MG SUBL    Take one tablet by mouth at bedtime as needed for rest.  Modified Medications   No medications on file  Discontinued Medications   BUPROPION (WELLBUTRIN XL) 300 MG 24 HR TABLET    TAKE 1 TABLET BY MOUTH DAILY   PREGABALIN (LYRICA) 50 MG CAPSULE    One twice daily to help neuropathy pain     Review of Systems  Constitutional: Negative for fever, chills, diaphoresis, activity change, appetite change, fatigue and unexpected weight change.  HENT: Positive for hearing loss and rhinorrhea.   Eyes: Negative.   Respiratory: Negative for apnea, cough, choking, chest tightness and shortness of breath.   Cardiovascular: Negative for chest pain, palpitations and leg swelling.  Gastrointestinal: Negative.   Endocrine: Negative.   Genitourinary: Negative.   Musculoskeletal: Positive for arthralgias, back pain and gait problem.  Skin: Negative for color change, pallor, rash and wound.  Neurological: Negative.   Hematological: Bruises/bleeds easily.  Psychiatric/Behavioral: Positive for confusion.    Filed Vitals:   06/23/13 1059  BP: 142/80  Pulse: 76  Temp: 98 F (36.7 C)  TempSrc: Oral  Weight:  178 lb (80.74 kg)   Physical Exam  Constitutional: He is oriented to person, place, and time. No distress.  Frail elderly male  HENT:  Nose: Nose normal.  Mouth/Throat: No oropharyngeal exudate.  Bilateral loss of hearing. Rhinorrhea.  Eyes:  Corrective lenses  Neck: Normal range of motion. Neck supple. No JVD present. No tracheal deviation present. No thyromegaly present.  Cardiovascular: Normal rate and regular rhythm.  Exam reveals no gallop and no friction rub.   Murmur heard. Grade 3/6 cooing ejection murmur  Pulmonary/Chest: No respiratory distress. He has no wheezes. He has no rales.  Abdominal: Soft. Bowel sounds are normal. He exhibits no distension and no mass.  There is no tenderness.  Genitourinary: Guaiac positive stool.  Prostate is absent. There is a 5mm firm, but not hard, nodule in the left lower side. Stool is a maroon/ brown and heme positive.  Musculoskeletal: Normal range of motion. He exhibits edema. He exhibits no tenderness.  Lymphadenopathy:    He has no cervical adenopathy.  Neurological: He is alert and oriented to person, place, and time. No cranial nerve deficit. Coordination normal.  04/14/13 MMSE: 29/30. Failed clock drawing.  Skin: No rash noted. No erythema. No pallor.  Actinic keratosis. Bruises.  Psychiatric: He has a normal mood and affect. His behavior is normal. Thought content normal.  Some loss of memory. Poor historian.        Assessment/Plan  Unspecified hereditary and idiopathic peripheral neuropathy: continue gabapentin  Edema: resolved  URTI (acute upper respiratory infection): symptomatic treatment

## 2013-07-29 ENCOUNTER — Encounter: Payer: Self-pay | Admitting: Internal Medicine

## 2013-08-20 ENCOUNTER — Other Ambulatory Visit: Payer: Self-pay | Admitting: Internal Medicine

## 2013-08-25 ENCOUNTER — Encounter: Payer: Self-pay | Admitting: Internal Medicine

## 2013-09-01 ENCOUNTER — Non-Acute Institutional Stay: Payer: Medicare Other | Admitting: Internal Medicine

## 2013-09-01 ENCOUNTER — Encounter: Payer: Self-pay | Admitting: Internal Medicine

## 2013-09-01 VITALS — BP 120/64 | HR 76 | Ht 70.0 in | Wt 171.0 lb

## 2013-09-01 DIAGNOSIS — R5383 Other fatigue: Secondary | ICD-10-CM

## 2013-09-01 DIAGNOSIS — I739 Peripheral vascular disease, unspecified: Secondary | ICD-10-CM

## 2013-09-01 DIAGNOSIS — E785 Hyperlipidemia, unspecified: Secondary | ICD-10-CM

## 2013-09-01 DIAGNOSIS — G4700 Insomnia, unspecified: Secondary | ICD-10-CM

## 2013-09-01 DIAGNOSIS — R531 Weakness: Secondary | ICD-10-CM

## 2013-09-01 DIAGNOSIS — I350 Nonrheumatic aortic (valve) stenosis: Secondary | ICD-10-CM

## 2013-09-01 DIAGNOSIS — I359 Nonrheumatic aortic valve disorder, unspecified: Secondary | ICD-10-CM

## 2013-09-01 DIAGNOSIS — D649 Anemia, unspecified: Secondary | ICD-10-CM

## 2013-09-01 DIAGNOSIS — R5381 Other malaise: Secondary | ICD-10-CM

## 2013-09-01 DIAGNOSIS — L570 Actinic keratosis: Secondary | ICD-10-CM

## 2013-09-13 ENCOUNTER — Other Ambulatory Visit: Payer: Self-pay | Admitting: Internal Medicine

## 2013-09-17 ENCOUNTER — Other Ambulatory Visit: Payer: Self-pay | Admitting: Internal Medicine

## 2013-09-17 DIAGNOSIS — E785 Hyperlipidemia, unspecified: Secondary | ICD-10-CM | POA: Insufficient documentation

## 2013-09-17 DIAGNOSIS — L570 Actinic keratosis: Secondary | ICD-10-CM | POA: Insufficient documentation

## 2013-09-17 NOTE — Progress Notes (Signed)
Patient ID: Justin Deleon, male   DOB: 1926-03-20, 80 y.o.   MRN: [id number]    Location:  [facility] (12)    No Known Allergies  Chief Complaint  Patient presents with  . Medical Managment of Chronic Issues    blood pressure, Peripheral Neuropathy, insomnia, depression    HPI:  Moderate to severe aortic stenosis: no syncope. Denies angina. Fatigues easily.  Peripheral vascular disease, unspecified: continues with some leg and foot pains. Difficult for me to say whether this is due to circulation or neuropathy.  Anemia: recheck next visit  Insomnia: improved with zolpidem.  Generalized weakness: unchanged. Needs help of anoter to stabilized hin when standing or sitting.  Hyperlipidemia:rrecheck next visit  Actinic keratosis: benign, Located on both hands.    Medications: Patient's Medications  New Prescriptions   No medications on file  Previous Medications   BUPROPION (WELLBUTRIN XL) 300 MG 24 HR TABLET    Take 300 mg by mouth every morning.   FERROUS SULFATE 325 (65 FE) MG TABLET    Take 325 mg by mouth daily with breakfast. Take 1 daily to treat anemia.   GABAPENTIN (NEURONTIN) 300 MG CAPSULE    Take 5 capsules at bedtime to help neuropathic pains in feet   MULTIPLE VITAMINS-MINERALS (MULTIVITAMIN WITH MINERALS) TABLET    Take 1 tablet by mouth as directed.    MULTIPLE VITAMINS-MINERALS (PRESERVISION AREDS) CAPS    Take 2 capsules by mouth daily.   OMEPRAZOLE MAGNESIUM (PRILOSEC OTC PO)    Take by mouth. Take one daily   POLYETHYL GLYCOL-PROPYL GLYCOL (SYSTANE) 0.4-0.3 % SOLN    Apply to eye. One drop 4 times daily as needed for dry eyes   POLYETHYLENE GLYCOL POWDER (GLYCOLAX/MIRALAX) POWDER    Take 17 g by mouth daily. Mix 17 grams with 8 oz. of water daily for laxative.   RED YEAST RICE 600 MG TABS    Take 3 tablets by mouth daily.    SENNA-DOCUSATE (SENNA PLUS) 8.6-50 MG PER TABLET    Take 3 tablets by mouth daily.    TAMSULOSIN (FLOMAX) 0.4 MG CAPS    Take one capsule by mouth once daily   VITAMIN D, CHOLECALCIFEROL, 1000 UNITS TABS    2 daily for vitamin D supplementation   ZOLPIDEM (AMBIEN) 10 MG TABLET    TAKE 1 TABLET BY MOUTH EVERY NIGHT AT BEDTIME AS NEEDED FOR REST   ZOLPIDEM TARTRATE 10 MG SUBL    Take one tablet by mouth at bedtime as needed for rest.  Modified Medications   Modified Medication Previous Medication   BUPROPION (WELLBUTRIN XL) 300 MG 24 HR TABLET buPROPion (WELLBUTRIN XL) 300 MG 24 hr tablet      TAKE 1 TABLET BY MOUTH EVERY DAY    TAKE 1 TABLET BY MOUTH DAILY  Discontinued Medications   No medications on file     Review of Systems  Constitutional: Negative for fever, chills, diaphoresis, activity change, appetite change, fatigue and unexpected weight change.  HENT: Positive for hearing loss and rhinorrhea.   Eyes: Negative.        Corrective lenses  Respiratory: Negative for apnea, cough, choking, chest tightness and shortness of breath.   Cardiovascular: Negative for chest pain, palpitations and leg swelling.  Gastrointestinal: Negative.   Endocrine: Negative.   Genitourinary: Negative.   Musculoskeletal: Positive for arthralgias, back pain and gait problem.  Skin: Negative for color change, pallor and wound.  AK on hands. Rash at neck.  Neurological: Negative.   Hematological: Bruises/bleeds easily.  Psychiatric/Behavioral: Positive for confusion.    Filed Vitals:   09/01/13 0917  BP: 120/64  Pulse: 76  Height: 5\' 10"  (1.778 m)  Weight: 171 lb (77.565 kg)   Physical Exam  Constitutional: He is oriented to person, place, and time. No distress.  Frail elderly male  HENT:  Nose: Nose normal.  Mouth/Throat: No oropharyngeal exudate.  Bilateral loss of hearing. Rhinorrhea.  Eyes:  Corrective lenses  Neck: Normal range of motion. Neck supple. No JVD present. No tracheal deviation present. No thyromegaly present.  Cardiovascular: Normal rate and regular  rhythm.  Exam reveals no gallop and no friction rub.   Murmur heard. Grade 3/6 cooing ejection murmur  Pulmonary/Chest: No respiratory distress. He has no wheezes. He has no rales.  Abdominal: Soft. Bowel sounds are normal. He exhibits no distension and no mass. There is no tenderness.  Musculoskeletal: Normal range of motion. He exhibits edema. He exhibits no tenderness.  Lymphadenopathy:    He has no cervical adenopathy.  Neurological: He is alert and oriented to person, place, and time. No cranial nerve deficit. Coordination normal.  04/14/13 MMSE: 29/30. Failed clock drawing.  Skin: Rash (at neck) noted. No erythema. No pallor.  Actinic keratosis.   Psychiatric: He has a normal mood and affect. His behavior is normal. Thought content normal.  Some loss of memory. Poor historian.     Labs reviewed: No visits with results within 3 Month(s) from this visit. Latest known visit with results is:  Nursing Home on 04/24/2013  Component Date Value Ref Range Status  . Hemoglobin 04/23/2013 9.7* 13.5 - 17.5 g/dL Final  . HCT 04/23/2013 29* 41 - 53 % Final  . Platelets 04/23/2013 484* 150 - 399 K/L Final  . WBC 04/23/2013 8.1   Final  . Glucose 04/23/2013 85   Final  . BUN 04/23/2013 13  4 - 21 mg/dL Final  . Creatinine 04/23/2013 1.2  0.6 - 1.3 mg/dL Final  . Potassium 04/23/2013 4.3  3.4 - 5.3 mmol/L Final  . Sodium 04/23/2013 141  137 - 147 mmol/L Final      Assessment/Plan  1. Moderate to severe aortic stenosis unchanged  2. Peripheral vascular disease, unspecified unchanged  3. Anemia recheck  4. Insomnia improved  5. Generalized weakness unchanged  6. Hyperlipidemia Red yeast rice  7. Actinic keratosis To see dermatologist

## 2013-09-17 NOTE — Patient Instructions (Signed)
Continue current medications. 

## 2013-09-18 ENCOUNTER — Other Ambulatory Visit: Payer: Self-pay | Admitting: Internal Medicine

## 2013-10-01 ENCOUNTER — Other Ambulatory Visit: Payer: Self-pay | Admitting: Internal Medicine

## 2013-10-09 ENCOUNTER — Other Ambulatory Visit: Payer: Self-pay | Admitting: Internal Medicine

## 2013-10-13 ENCOUNTER — Other Ambulatory Visit: Payer: Self-pay | Admitting: Internal Medicine

## 2013-11-03 ENCOUNTER — Ambulatory Visit: Payer: Medicare Other | Admitting: Internal Medicine

## 2013-11-10 HISTORY — PX: SKIN CANCER EXCISION: SHX779

## 2013-11-11 ENCOUNTER — Other Ambulatory Visit: Payer: Self-pay | Admitting: Internal Medicine

## 2013-11-17 ENCOUNTER — Non-Acute Institutional Stay: Payer: Medicare Other | Admitting: Internal Medicine

## 2013-11-17 ENCOUNTER — Encounter: Payer: Self-pay | Admitting: Internal Medicine

## 2013-11-17 VITALS — BP 124/64 | HR 76 | Wt 169.0 lb

## 2013-11-17 DIAGNOSIS — R531 Weakness: Secondary | ICD-10-CM

## 2013-11-17 DIAGNOSIS — I350 Nonrheumatic aortic (valve) stenosis: Secondary | ICD-10-CM

## 2013-11-17 DIAGNOSIS — R5383 Other fatigue: Secondary | ICD-10-CM

## 2013-11-17 DIAGNOSIS — N138 Other obstructive and reflux uropathy: Secondary | ICD-10-CM

## 2013-11-17 DIAGNOSIS — G609 Hereditary and idiopathic neuropathy, unspecified: Secondary | ICD-10-CM

## 2013-11-17 DIAGNOSIS — D649 Anemia, unspecified: Secondary | ICD-10-CM

## 2013-11-17 DIAGNOSIS — I359 Nonrheumatic aortic valve disorder, unspecified: Secondary | ICD-10-CM

## 2013-11-17 DIAGNOSIS — R5381 Other malaise: Secondary | ICD-10-CM

## 2013-11-17 DIAGNOSIS — I739 Peripheral vascular disease, unspecified: Secondary | ICD-10-CM

## 2013-11-17 DIAGNOSIS — E785 Hyperlipidemia, unspecified: Secondary | ICD-10-CM

## 2013-11-17 DIAGNOSIS — N401 Enlarged prostate with lower urinary tract symptoms: Secondary | ICD-10-CM

## 2013-11-17 DIAGNOSIS — R609 Edema, unspecified: Secondary | ICD-10-CM

## 2013-11-17 DIAGNOSIS — M79609 Pain in unspecified limb: Secondary | ICD-10-CM

## 2013-11-17 NOTE — Progress Notes (Signed)
Patient ID: Justin Deleon, male   DOB: 1925/10/01, 80 y.o.   MRN: [id number]    Location:  [facility]   Place of Service: CLINIC    No Known Allergies  Chief Complaint  Patient presents with  . Peripheral Neuropathy    wants to talk with Dr. Nyoka Cowden, to see if there is anything new, to make it less painful in legs afer he exercise .    HPI:  Unspecified hereditary and idiopathic peripheral neuropathy: returns today to discuss the continuing problem of pain in his legs. He has combined issues of PVD and peripheral neuropathy. He exercises, but this is painful . He walks, but this is painful.  Hypertrophy of prostate with urinary obstruction and other lower urinary tract symptoms (LUTS): on Flomax and says bladder is emptying satisfactorily  Calcific aortic stenosis; unchanged  Peripheral vascular disease, unspecified: unchanged. Continues to walk  Pain in limb; both legs  Edema; resolved  Generalized weakness; related to age and muscular atrophy  Anemia: recheck next visit. Remains on iron.  Hyperlipidemia: recheck next visit.    Medications: Patient's Medications  New Prescriptions   No medications on file  Previous Medications   BUPROPION (WELLBUTRIN XL) 300 MG 24 HR TABLET    Take 300 mg by mouth every morning.   CEPHALEXIN (KEFLEX) 500 MG CAPSULE    Take 500 mg by mouth. Take one three times daily for 10 days   FERROUS SULFATE 325 (65 FE) MG TABLET    TAKE 1 TABLET TWICE A DAY FOR ANEMIA   GABAPENTIN (NEURONTIN) 300 MG CAPSULE    Take 5 capsules at bedtime to help neuropathic pains in feet   MULTIPLE VITAMINS-MINERALS (MULTIVITAMIN WITH MINERALS) TABLET    Take 1 tablet by mouth as directed.    MULTIPLE VITAMINS-MINERALS (PRESERVISION AREDS) CAPS    Take 2 capsules by mouth daily.   OMEPRAZOLE MAGNESIUM (PRILOSEC OTC PO)    Take by mouth. Take one daily   POLYETHYL GLYCOL-PROPYL GLYCOL (SYSTANE) 0.4-0.3 % SOLN    Apply to eye. One drop 4 times daily as needed for dry  eyes   POLYETHYLENE GLYCOL POWDER (GLYCOLAX/MIRALAX) POWDER    Take 17 g by mouth daily. Mix 17 grams with 8 oz. of water daily for laxative.   RED YEAST RICE 600 MG TABS    Take 3 tablets by mouth daily.    SENNA-DOCUSATE (SENNA PLUS) 8.6-50 MG PER TABLET    Take 3 tablets by mouth daily.    TAMSULOSIN (FLOMAX) 0.4 MG CAPS CAPSULE    TAKE ONE CAPSULE BY MOUTH ONCE DAILY   VITAMIN D, CHOLECALCIFEROL, 1000 UNITS TABS    2 daily for vitamin D supplementation   ZOLPIDEM TARTRATE 10 MG SUBL    Take one tablet by mouth at bedtime as needed for rest.  Modified Medications   No medications on file  Discontinued Medications   BUPROPION (WELLBUTRIN XL) 300 MG 24 HR TABLET    TAKE 1 TABLET BY MOUTH EVERY DAY   ZOLPIDEM (AMBIEN) 10 MG TABLET    TAKE 1 TABLET BY MOUTH EVERY NIGHT AT BEDTIME AS NEEDED FOR REST     Review of Systems  Constitutional: Negative for fever, chills, diaphoresis, activity change, appetite change, fatigue and unexpected weight change.  HENT: Positive for hearing loss and rhinorrhea.   Eyes: Negative.        Corrective lenses  Respiratory: Negative for apnea, cough, choking, chest tightness and shortness of breath.   Cardiovascular: Negative for  chest pain, palpitations and leg swelling.  Gastrointestinal: Negative.   Endocrine: Negative.   Genitourinary: Negative.   Musculoskeletal: Positive for arthralgias, back pain and gait problem.  Skin: Negative for color change, pallor and wound.       AK on hands.  Skin cancers removed by Dr. Nevada Crane on the left postauricular area and the posterior neck. Healing laceration of the left forearm.  Neurological: Negative.   Hematological: Bruises/bleeds easily.  Psychiatric/Behavioral: Positive for confusion.    Filed Vitals:   11/17/13 0911  BP: 124/64  Pulse: 76  Weight: 169 lb (76.658 kg)   Body mass index is 24.25 kg/(m^2).  Physical Exam  Constitutional: He is oriented to person, place, and time. No distress.  Frail  elderly male  HENT:  Nose: Nose normal.  Mouth/Throat: No oropharyngeal exudate.  Bilateral loss of hearing. Rhinorrhea.  Eyes:  Corrective lenses  Neck: Normal range of motion. Neck supple. No JVD present. No tracheal deviation present. No thyromegaly present.  Cardiovascular: Normal rate and regular rhythm.  Exam reveals no gallop and no friction rub.   Murmur heard. Grade 3/6 cooing ejection murmur  Pulmonary/Chest: No respiratory distress. He has no wheezes. He has no rales.  Abdominal: Soft. Bowel sounds are normal. He exhibits no distension and no mass. There is no tenderness.  Musculoskeletal: Normal range of motion. He exhibits no edema and no tenderness.  Lymphadenopathy:    He has no cervical adenopathy.  Neurological: He is alert and oriented to person, place, and time. No cranial nerve deficit. Coordination normal.  04/14/13 MMSE: 29/30. Failed clock drawing.  Skin: No rash (at neck) noted. No erythema. No pallor.  Actinic keratosis.  Skin cancers removed from left postauricular area and posterior neck. \Healing laceration of the left forearm.  Psychiatric: He has a normal mood and affect. His behavior is normal. Thought content normal.  Some loss of memory. Poor historian.     Labs reviewed: No visits with results within 3 Month(s) from this visit. Latest known visit with results is:  [facility]urs[facility] Home on 04/24/2013  Component Date Value Ref Range Status  . Hemoglobin 04/23/2013 9.7* 13.5 - 17.5 g/dL Final  . HCT 04/23/2013 29* 41 - 53 % Final  . Platelets 04/23/2013 484* 150 - 399 K/L Final  . WBC 04/23/2013 8.1   Final  . Glucose 04/23/2013 85   Final  . BUN 04/23/2013 13  4 - 21 mg/dL Final  . Creatinine 04/23/2013 1.2  0.6 - 1.3 mg/dL Final  . Potassium 04/23/2013 4.3  3.4 - 5.3 mmol/L Final  . Sodium 04/23/2013 141  137 - 147 mmol/L Final      Assessment/Plan  1. Unspecified hereditary and idiopathic peripheral neuropathy Continue gabapentin. I do not  think there is likely to be any better result with other medications.  2. Hypertrophy of prostate with urinary obstruction and other lower urinary tract symptoms (LUTS) continue tamsulosin  3. Calcific aortic stenosis unchanged  4. Peripheral vascular disease, unspecified Continue to walk and exercise  5. Pain in limb I advised patient there is no "cure" for the issues that cause his pain.  6. Edema resolved  7. Generalized weakness Continue to exercise  8. Anemia Recheck next visit  9. Hyperlipidemia recheck next visit

## 2013-12-07 ENCOUNTER — Other Ambulatory Visit: Payer: Self-pay | Admitting: Internal Medicine

## 2013-12-08 ENCOUNTER — Telehealth: Payer: Self-pay | Admitting: *Deleted

## 2013-12-08 ENCOUNTER — Other Ambulatory Visit: Payer: Self-pay | Admitting: *Deleted

## 2013-12-08 DIAGNOSIS — G609 Hereditary and idiopathic neuropathy, unspecified: Secondary | ICD-10-CM

## 2013-12-08 MED ORDER — GABAPENTIN 300 MG PO CAPS: ORAL_CAPSULE | ORAL | Status: DC

## 2013-12-08 NOTE — Telephone Encounter (Signed)
Refill medication early.

## 2013-12-08 NOTE — Telephone Encounter (Signed)
Patient called and stated that he needs an early refill on his Gabapentin. Patient stated that he has had to take more than 5 tablets a day because of PT. Medication is due next Monday, but patient is out of now. Patient stated that he is easing up on PT so he won't have to take extra from now on. Please Advise.

## 2013-12-08 NOTE — Telephone Encounter (Signed)
Rx Faxed to Pharmacy and patient Notified.

## 2013-12-08 NOTE — Telephone Encounter (Signed)
Patient requested and Dr. Nyoka Cowden Approved to be filled early

## 2013-12-11 ENCOUNTER — Other Ambulatory Visit: Payer: Self-pay | Admitting: Internal Medicine

## 2013-12-31 ENCOUNTER — Other Ambulatory Visit: Payer: Self-pay | Admitting: Internal Medicine

## 2014-02-05 ENCOUNTER — Other Ambulatory Visit: Payer: Self-pay | Admitting: Internal Medicine

## 2014-02-22 LAB — BASIC METABOLIC PANEL
BUN: 19 mg/dL (ref 4–21)
CREATININE: 1.4 mg/dL — AB (ref 0.6–1.3)
Glucose: 71 mg/dL
Potassium: 4.5 mmol/L (ref 3.4–5.3)
SODIUM: 139 mmol/L (ref 137–147)

## 2014-02-22 LAB — HEPATIC FUNCTION PANEL
ALK PHOS: 51 U/L (ref 25–125)
ALT: 20 U/L (ref 10–40)
AST: 23 U/L (ref 14–40)
BILIRUBIN, TOTAL: 0.9 mg/dL

## 2014-02-22 LAB — LIPID PANEL
Cholesterol: 192 mg/dL (ref 0–200)
HDL: 63 mg/dL (ref 35–70)
LDL Cholesterol: 110 mg/dL
TRIGLYCERIDES: 95 mg/dL (ref 40–160)

## 2014-02-22 LAB — CBC AND DIFFERENTIAL
HEMATOCRIT: 39 % — AB (ref 41–53)
Hemoglobin: 13.4 g/dL — AB (ref 13.5–17.5)
Platelets: 356 10*3/uL (ref 150–399)
WBC: 8.3 10^3/mL

## 2014-03-02 ENCOUNTER — Encounter: Payer: Self-pay | Admitting: Internal Medicine

## 2014-03-02 ENCOUNTER — Non-Acute Institutional Stay: Payer: Medicare Other | Admitting: Internal Medicine

## 2014-03-02 VITALS — BP 128/84 | HR 68 | Wt 172.0 lb

## 2014-03-02 DIAGNOSIS — G609 Hereditary and idiopathic neuropathy, unspecified: Secondary | ICD-10-CM

## 2014-03-02 DIAGNOSIS — M25561 Pain in right knee: Secondary | ICD-10-CM

## 2014-03-02 DIAGNOSIS — D649 Anemia, unspecified: Secondary | ICD-10-CM

## 2014-03-02 DIAGNOSIS — Z299 Encounter for prophylactic measures, unspecified: Secondary | ICD-10-CM

## 2014-03-02 DIAGNOSIS — N289 Disorder of kidney and ureter, unspecified: Secondary | ICD-10-CM

## 2014-03-02 DIAGNOSIS — M25569 Pain in unspecified knee: Secondary | ICD-10-CM

## 2014-03-02 DIAGNOSIS — M25562 Pain in left knee: Principal | ICD-10-CM

## 2014-03-02 DIAGNOSIS — I739 Peripheral vascular disease, unspecified: Secondary | ICD-10-CM

## 2014-03-02 MED ORDER — GABAPENTIN 300 MG PO CAPS: ORAL_CAPSULE | ORAL | Status: DC

## 2014-03-02 MED ORDER — HYDROCODONE-ACETAMINOPHEN 5-325 MG PO TABS: ORAL_TABLET | ORAL | Status: DC

## 2014-03-02 NOTE — Progress Notes (Signed)
Patient ID: Justin Deleon, male   DOB: Aug 18, 1925, 80 y.o.   MRN: [id number]    Location:  [facility] (12)    No Known Allergies  Chief Complaint  Patient presents with  . Medical Management of Chronic Issues    blood pressure, anemia, cholesterol, peripheral neuropathy. Here with daughter Lelon Frohlich.    HPI:  Pain in both knees: startsout talking about worsening of his neuropathic pains, but soon indicates that it is really hjis knees that bother him the most. He is limiting his activity due to the knee pains. He has trouble sleeping. He says his feet don't bother him as much anymore.  Unspecified hereditary and idiopathic peripheral neuropathy: using gabapentin for relief.  Anemia, unspecified anemia type: corrected. Still taking iron  Peripheral vascular disease, unspecified: unchanged  Renal insufficiency: mild and stable  Preventive measure - Plan: DNR (Do Not Resuscitate) reconfirmed    Medications: Patient's Medications  New Prescriptions   No medications on file  Previous Medications   BUPROPION (WELLBUTRIN XL) 300 MG 24 HR TABLET    Take 300 mg by mouth every morning.   FERROUS SULFATE 325 (65 FE) MG TABLET    TAKE 1 TABLET TWICE A DAY FOR ANEMIA   GABAPENTIN (NEURONTIN) 300 MG CAPSULE    Take 5 capsules at bedtime to help neuropathic pains in feet   MULTIPLE VITAMINS-MINERALS (MULTIVITAMIN WITH MINERALS) TABLET    Take 1 tablet by mouth as directed.    MULTIPLE VITAMINS-MINERALS (PRESERVISION AREDS) CAPS    Take 2 capsules by mouth daily. In morning   OMEPRAZOLE MAGNESIUM (PRILOSEC OTC PO)    Take by mouth. Take one daily, in morning   POLYETHYL GLYCOL-PROPYL GLYCOL (SYSTANE) 0.4-0.3 % SOLN    Apply to eye. One drop 4 times daily as needed for dry eyes   POLYETHYLENE GLYCOL POWDER (GLYCOLAX/MIRALAX) POWDER    Take 17 g by mouth daily. Mix 17 grams with 8 oz. of water daily for laxative, in morning   RED YEAST RICE 600 MG TABS    Take  1 tablet by mouth daily. In morning   SENNA-DOCUSATE (SENNA PLUS) 8.6-50 MG PER TABLET    Take 1 tablet by mouth daily. In evening   TAMSULOSIN (FLOMAX) 0.4 MG CAPS CAPSULE    TAKE ONE CAPSULE BY MOUTH EVERY DAY.   VITAMIN D, CHOLECALCIFEROL, 1000 UNITS TABS    2 daily for vitamin D supplementation   ZOLPIDEM (AMBIEN) 10 MG TABLET    TAKE 1 TABLET BY MOUTH EVERY NIGHT AT BEDTIME AS NEEDED FOR REST  Modified Medications   No medications on file  Discontinued Medications   CEPHALEXIN (KEFLEX) 500 MG CAPSULE    Take 500 mg by mouth. Take one three times daily for 10 days   ZOLPIDEM TARTRATE 10 MG SUBL    Take one tablet by mouth at bedtime as needed for rest.     Review of Systems  Constitutional: Negative for fever, chills, diaphoresis, activity change, appetite change, fatigue and unexpected weight change.  HENT: Positive for hearing loss and rhinorrhea.   Eyes: Negative.        Corrective lenses  Respiratory: Negative for apnea, cough, choking, chest tightness and shortness of breath.   Cardiovascular: Negative for chest pain, palpitations and leg swelling.  Gastrointestinal: Negative.   Endocrine: Negative.   Genitourinary: Negative.   Musculoskeletal: Positive for arthralgias, back pain and gait problem.       Tender  at both knees. No effusion or crepitance.  Skin: Negative for color change, pallor and wound.       AK on hands.  Skin cancers removed by Dr. Nevada Crane on the left postauricular area and the posterior neck. Healing laceration of the left forearm.  Neurological: Negative.   Hematological: Bruises/bleeds easily.  Psychiatric/Behavioral: Positive for confusion.    Filed Vitals:   03/02/14 0931  BP: 128/84  Pulse: 68  Weight: 172 lb (78.019 kg)   Body mass index is 24.68 kg/(m^2).  Physical Exam  Constitutional: He is oriented to person, place, and time. No distress.  Frail elderly male  HENT:  Nose: Nose normal.  Mouth/Throat: No oropharyngeal exudate.    Bilateral loss of hearing. Rhinorrhea.  Eyes:  Corrective lenses  Neck: Normal range of motion. Neck supple. No JVD present. No tracheal deviation present. No thyromegaly present.  Cardiovascular: Normal rate and regular rhythm.  Exam reveals no gallop and no friction rub.   Murmur heard. Grade 3/6 cooing ejection murmur  Pulmonary/Chest: No respiratory distress. He has no wheezes. He has no rales.  Abdominal: Soft. Bowel sounds are normal. He exhibits no distension and no mass. There is no tenderness.  Musculoskeletal: Normal range of motion. He exhibits no edema and no tenderness.  Lymphadenopathy:    He has no cervical adenopathy.  Neurological: He is alert and oriented to person, place, and time. No cranial nerve deficit. Coordination normal.  04/14/13 MMSE: 29/30. Failed clock drawing.  Skin: No rash (at neck) noted. No erythema. No pallor.  Actinic keratosis.  Skin cancers removed from left postauricular area and posterior neck. \Healing laceration of the left forearm.  Psychiatric: He has a normal mood and affect. His behavior is normal. Thought content normal.  Some loss of memory. Poor historian.     Labs reviewed: Nursing Home on 03/02/2014  Component Date Value Ref Range Status  . Hemoglobin 02/22/2014 13.4* 13.5 - 17.5 g/dL Final  . HCT 02/22/2014 39* 41 - 53 % Final  . Platelets 02/22/2014 356  150 - 399 K/L Final  . WBC 02/22/2014 8.3   Final  . Glucose 02/22/2014 71   Final  . BUN 02/22/2014 19  4 - 21 mg/dL Final  . Creatinine 02/22/2014 1.4* 0.6 - 1.3 mg/dL Final  . Potassium 02/22/2014 4.5  3.4 - 5.3 mmol/L Final  . Sodium 02/22/2014 139  137 - 147 mmol/L Final  . Triglycerides 02/22/2014 95  40 - 160 mg/dL Final  . Cholesterol 02/22/2014 192  0 - 200 mg/dL Final  . HDL 02/22/2014 63  35 - 70 mg/dL Final  . LDL Cholesterol 02/22/2014 110   Final  . Alkaline Phosphatase 02/22/2014 51  25 - 125 U/L Final  . ALT 02/22/2014 20  10 - 40 U/L Final  . AST  02/22/2014 23  14 - 40 U/L Final  . Bilirubin, Total 02/22/2014 0.9   Final    Assessment/Plan  1. Pain in both knees - HYDROcodone-acetaminophen (NORCO/VICODIN) 5-325 MG per tablet; Take 1 tablet prior to exercise to help pain  Dispense: 30 tablet; Refill: 0  2. Unspecified hereditary and idiopathic peripheral neuropathy - gabapentin (NEURONTIN) 300 MG capsule; Take 6 capsules at bedtime to help neuropathic pains in feet  Dispense: 180 capsule; Refill: 3  3. Anemia, unspecified anemia type -stop ferrous sulfate*  4. Peripheral vascular disease, unspecified unchanged  5. Renal insufficiency Follow lab  6. Preventive measure - DNR (Do Not Resuscitate)

## 2014-03-08 ENCOUNTER — Other Ambulatory Visit: Payer: Self-pay | Admitting: Internal Medicine

## 2014-03-09 ENCOUNTER — Other Ambulatory Visit: Payer: Self-pay | Admitting: *Deleted

## 2014-03-09 MED ORDER — ZOLPIDEM TARTRATE 10 MG PO TABS: ORAL_TABLET | ORAL | Status: DC

## 2014-03-09 NOTE — Telephone Encounter (Signed)
Patient requested and phoned into pharmacy

## 2014-03-12 ENCOUNTER — Encounter: Payer: Self-pay | Admitting: Internal Medicine

## 2014-03-23 HISTORY — PX: SKIN CANCER EXCISION: SHX779

## 2014-03-30 ENCOUNTER — Encounter: Payer: Self-pay | Admitting: Internal Medicine

## 2014-03-30 ENCOUNTER — Non-Acute Institutional Stay: Payer: Medicare Other | Admitting: Internal Medicine

## 2014-03-30 VITALS — BP 144/76 | HR 68 | Temp 97.5°F | Wt 174.0 lb

## 2014-03-30 DIAGNOSIS — G609 Hereditary and idiopathic neuropathy, unspecified: Secondary | ICD-10-CM

## 2014-03-30 DIAGNOSIS — M25562 Pain in left knee: Principal | ICD-10-CM

## 2014-03-30 DIAGNOSIS — F329 Major depressive disorder, single episode, unspecified: Secondary | ICD-10-CM

## 2014-03-30 DIAGNOSIS — M25561 Pain in right knee: Secondary | ICD-10-CM

## 2014-03-30 DIAGNOSIS — R5381 Other malaise: Secondary | ICD-10-CM

## 2014-03-30 DIAGNOSIS — R531 Weakness: Secondary | ICD-10-CM

## 2014-03-30 DIAGNOSIS — F3289 Other specified depressive episodes: Secondary | ICD-10-CM

## 2014-03-30 DIAGNOSIS — K5900 Constipation, unspecified: Secondary | ICD-10-CM

## 2014-03-30 DIAGNOSIS — M25569 Pain in unspecified knee: Secondary | ICD-10-CM

## 2014-03-30 DIAGNOSIS — R5383 Other fatigue: Secondary | ICD-10-CM

## 2014-03-30 DIAGNOSIS — F32A Depression, unspecified: Secondary | ICD-10-CM

## 2014-03-30 NOTE — Progress Notes (Signed)
Patient ID: Justin Deleon, male   DOB: 05-Nov-1925, 80 y.o.   MRN: [id number]    Location:  [facility] (12)    No Known Allergies  Chief Complaint  Patient presents with  . Medical Management of Chronic Issues    knee pain follow up after starting Hydrocodone. Here with daughter Webb Silversmith  . Skin Cancer    removed 03/23/14 by Dr. Nevada Crane below left ear, some frozen on chest.    HPI:  Pain in both knees: improved with use of Vicodin prior to exercise  Generalized weakness: improved  Depression (emotion): improved on bupropion  Unspecified hereditary and idiopathic peripheral neuropathy: controlled with use of gabapentin  Unspecified constipation: no worse since on Vicodin    Medications: Patient's Medications  New Prescriptions   No medications on file  Previous Medications   BUPROPION (WELLBUTRIN XL) 300 MG 24 HR TABLET    Take 300 mg by mouth every morning.   BUPROPION (WELLBUTRIN XL) 300 MG 24 HR TABLET    TAKE 1 TABLET BY MOUTH EVERY DAY   CEPHALEXIN (KEFLEX) 500 MG CAPSULE    Take 500 mg by mouth. Take one twice daily for 12 days   GABAPENTIN (NEURONTIN) 300 MG CAPSULE    Take 6 capsules at bedtime to help neuropathic pains in feet   HYDROCODONE-ACETAMINOPHEN (NORCO/VICODIN) 5-325 MG PER TABLET    Take 1 tablet prior to exercise to help pain   MULTIPLE VITAMINS-MINERALS (MULTIVITAMIN WITH MINERALS) TABLET    Take 1 tablet by mouth as directed.    MULTIPLE VITAMINS-MINERALS (PRESERVISION AREDS) CAPS    Take 2 capsules by mouth daily. In morning   OMEPRAZOLE MAGNESIUM (PRILOSEC OTC PO)    Take by mouth. Take one daily, in morning   POLYETHYL GLYCOL-PROPYL GLYCOL (SYSTANE) 0.4-0.3 % SOLN    Apply to eye. One drop 4 times daily as needed for dry eyes   POLYETHYLENE GLYCOL POWDER (GLYCOLAX/MIRALAX) POWDER    Take 17 g by mouth daily. Mix 17 grams with 8 oz. of water daily for laxative, in morning   RED YEAST RICE 600 MG TABS    Take 1 tablet by  mouth daily. In morning   SENNA-DOCUSATE (SENNA PLUS) 8.6-50 MG PER TABLET    Take 1 tablet by mouth daily. In evening   TAMSULOSIN (FLOMAX) 0.4 MG CAPS CAPSULE    TAKE ONE CAPSULE BY MOUTH EVERY DAY.   VITAMIN D, CHOLECALCIFEROL, 1000 UNITS TABS    2 daily for vitamin D supplementation   ZOLPIDEM (AMBIEN) 10 MG TABLET    Take one tablet by mouth at bedtime as needed for rest  Modified Medications   No medications on file  Discontinued Medications   No medications on file     Review of Systems  Constitutional: Negative for fever, chills, diaphoresis, activity change, appetite change, fatigue and unexpected weight change.  HENT: Positive for hearing loss and rhinorrhea.   Eyes: Negative.        Corrective lenses  Respiratory: Negative for apnea, cough, choking, chest tightness and shortness of breath.   Cardiovascular: Negative for chest pain, palpitations and leg swelling.  Gastrointestinal: Negative.   Endocrine: Negative.   Genitourinary: Negative.   Musculoskeletal: Positive for arthralgias, back pain and gait problem.       Tender at both knees. No effusion or crepitance.  Skin: Negative for color change, pallor and wound.       AK on hands.  Skin cancers  removed by Dr. Nevada Crane on the left postauricular area and the posterior neck. Healing laceration of the left forearm.  Neurological: Negative.   Hematological: Bruises/bleeds easily.  Psychiatric/Behavioral: Positive for confusion.    Filed Vitals:   03/30/14 0922  BP: 144/76  Pulse: 68  Temp: 97.5 F (36.4 C)  TempSrc: Oral  Weight: 174 lb (78.926 kg)   Body mass index is 24.97 kg/(m^2).  Physical Exam  Constitutional: He is oriented to person, place, and time. No distress.  Frail elderly male  HENT:  Nose: Nose normal.  Mouth/Throat: No oropharyngeal exudate.  Bilateral loss of hearing. Rhinorrhea.  Eyes:  Corrective lenses  Neck: Normal range of motion. Neck supple. No JVD present. No tracheal deviation  present. No thyromegaly present.  Cardiovascular: Normal rate and regular rhythm.  Exam reveals no gallop and no friction rub.   Murmur heard. Grade 3/6 cooing ejection murmur  Pulmonary/Chest: No respiratory distress. He has no wheezes. He has no rales.  Abdominal: Soft. Bowel sounds are normal. He exhibits no distension and no mass. There is no tenderness.  Musculoskeletal: Normal range of motion. He exhibits no edema and no tenderness.  Lymphadenopathy:    He has no cervical adenopathy.  Neurological: He is alert and oriented to person, place, and time. No cranial nerve deficit. Coordination normal.  04/14/13 MMSE: 29/30. Failed clock drawing.  Skin: No rash (at neck) noted. No erythema. No pallor.  Actinic keratosis.  Skin cancers removed from left postauricular area and posterior neck. \Healing laceration of the left forearm.  Psychiatric: He has a normal mood and affect. His behavior is normal. Thought content normal.  Some loss of memory. Poor historian.     Labs reviewed: Nursing Home on 03/02/2014  Component Date Value Ref Range Status  . Hemoglobin 02/22/2014 13.4* 13.5 - 17.5 g/dL Final  . HCT 02/22/2014 39* 41 - 53 % Final  . Platelets 02/22/2014 356  150 - 399 K/L Final  . WBC 02/22/2014 8.3   Final  . Glucose 02/22/2014 71   Final  . BUN 02/22/2014 19  4 - 21 mg/dL Final  . Creatinine 02/22/2014 1.4* 0.6 - 1.3 mg/dL Final  . Potassium 02/22/2014 4.5  3.4 - 5.3 mmol/L Final  . Sodium 02/22/2014 139  137 - 147 mmol/L Final  . Triglycerides 02/22/2014 95  40 - 160 mg/dL Final  . Cholesterol 02/22/2014 192  0 - 200 mg/dL Final  . HDL 02/22/2014 63  35 - 70 mg/dL Final  . LDL Cholesterol 02/22/2014 110   Final  . Alkaline Phosphatase 02/22/2014 51  25 - 125 U/L Final  . ALT 02/22/2014 20  10 - 40 U/L Final  . AST 02/22/2014 23  14 - 40 U/L Final  . Bilirubin, Total 02/22/2014 0.9   Final    Assessment/Plan  1. Pain in both knees Continue Vicodin  2. Generalized  weakness Continue exercising  3. Depression (emotion) Continue bupropion  4. Unspecified hereditary and idiopathic peripheral neuropathy Continue gabapentin  5. Unspecified constipation Continue Miralax

## 2014-04-05 ENCOUNTER — Other Ambulatory Visit: Payer: Self-pay | Admitting: Internal Medicine

## 2014-04-22 ENCOUNTER — Other Ambulatory Visit: Payer: Self-pay | Admitting: Nurse Practitioner

## 2014-04-22 ENCOUNTER — Other Ambulatory Visit: Payer: Self-pay | Admitting: Internal Medicine

## 2014-04-22 DIAGNOSIS — G609 Hereditary and idiopathic neuropathy, unspecified: Secondary | ICD-10-CM

## 2014-04-22 MED ORDER — GABAPENTIN 300 MG PO CAPS: ORAL_CAPSULE | ORAL | Status: DC

## 2014-04-22 NOTE — Telephone Encounter (Signed)
Patient requested to be faxed to pharmacy. Will talk to Dr. Nyoka Cowden regarding increasing the Gabapentin to 7 tablets at next appointment.

## 2014-05-03 ENCOUNTER — Other Ambulatory Visit: Payer: Self-pay | Admitting: Internal Medicine

## 2014-05-03 DIAGNOSIS — M25561 Pain in right knee: Secondary | ICD-10-CM

## 2014-05-03 DIAGNOSIS — M25562 Pain in left knee: Principal | ICD-10-CM

## 2014-05-03 MED ORDER — HYDROCODONE-ACETAMINOPHEN 5-325 MG PO TABS: ORAL_TABLET | ORAL | Status: DC

## 2014-05-03 NOTE — Telephone Encounter (Signed)
Patient called and Requested and will have someone pick up to take to pharmacy.

## 2014-06-01 ENCOUNTER — Other Ambulatory Visit: Payer: Self-pay | Admitting: Internal Medicine

## 2014-06-01 IMAGING — CR DG CHEST 2V
2 series · 2 of 2 positions shown · non-contrast
Comparison: 05/09/2011.

CLINICAL DATA: Chest discomfort.  Shortness of breath.

CHEST - 2 VIEW

[PA]
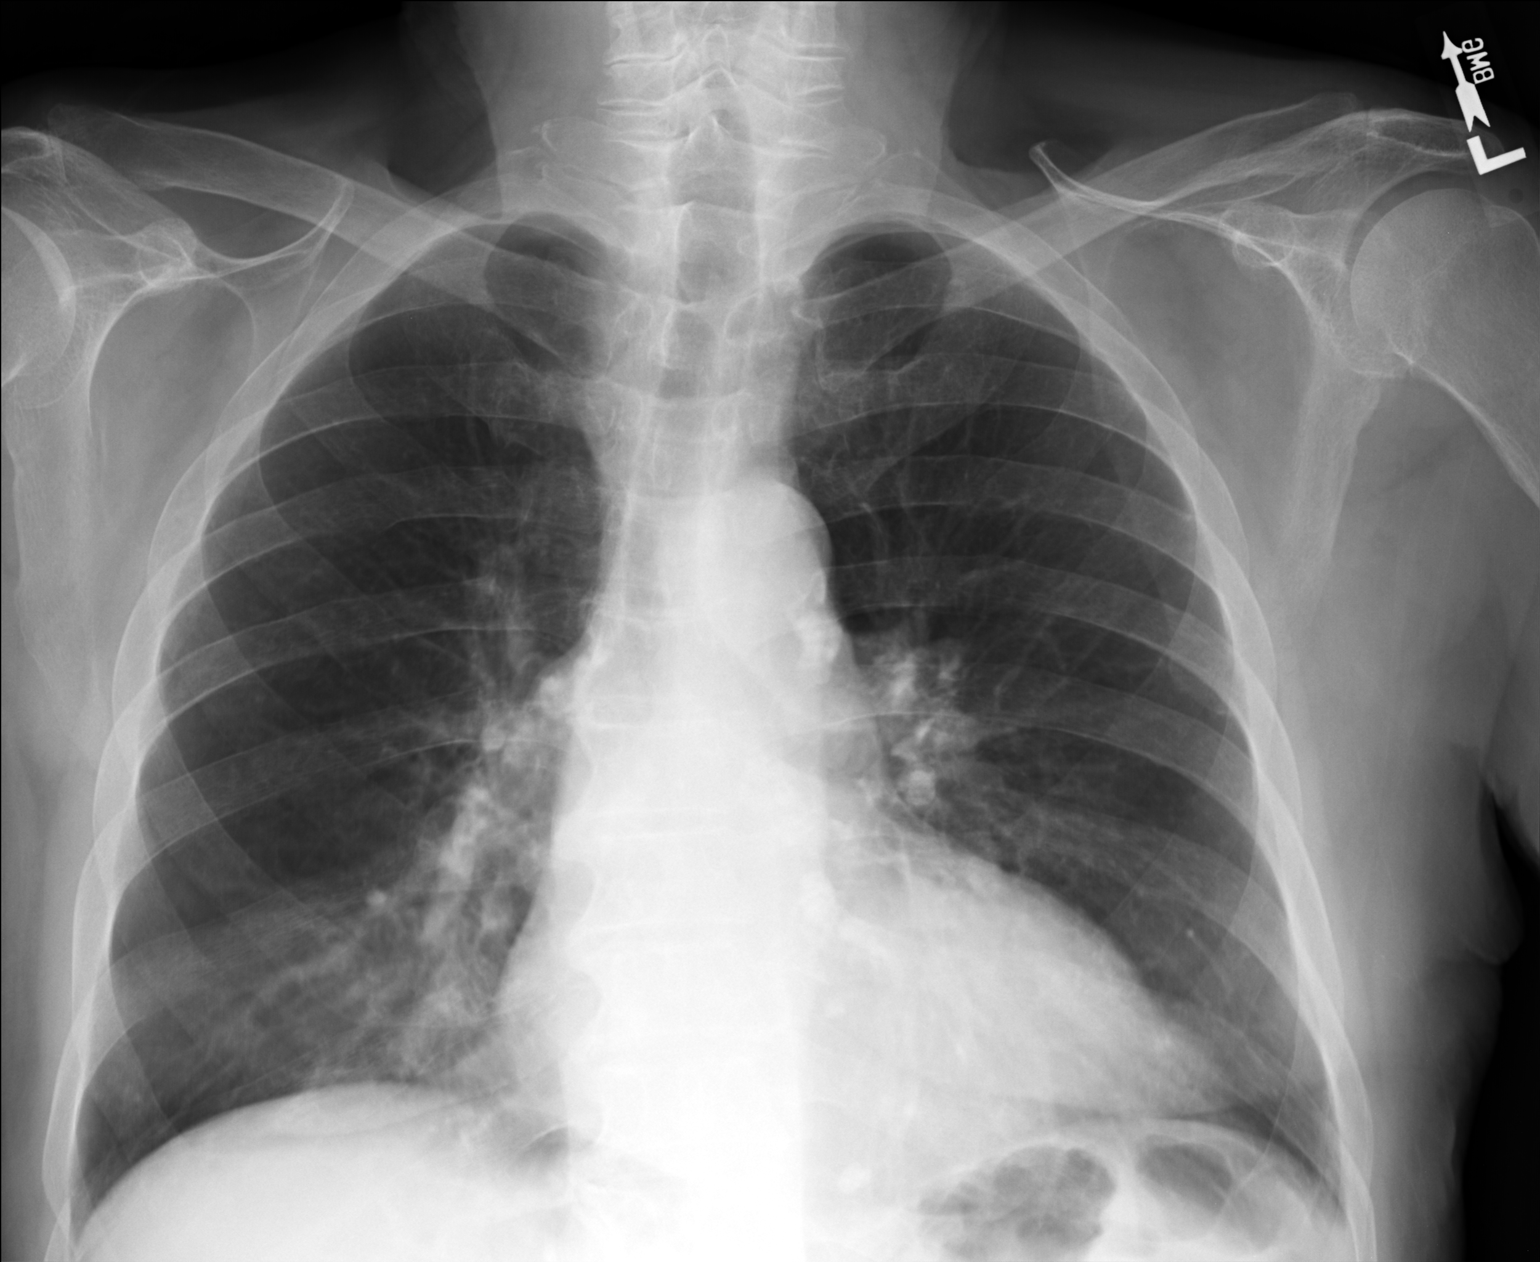

[lateral]
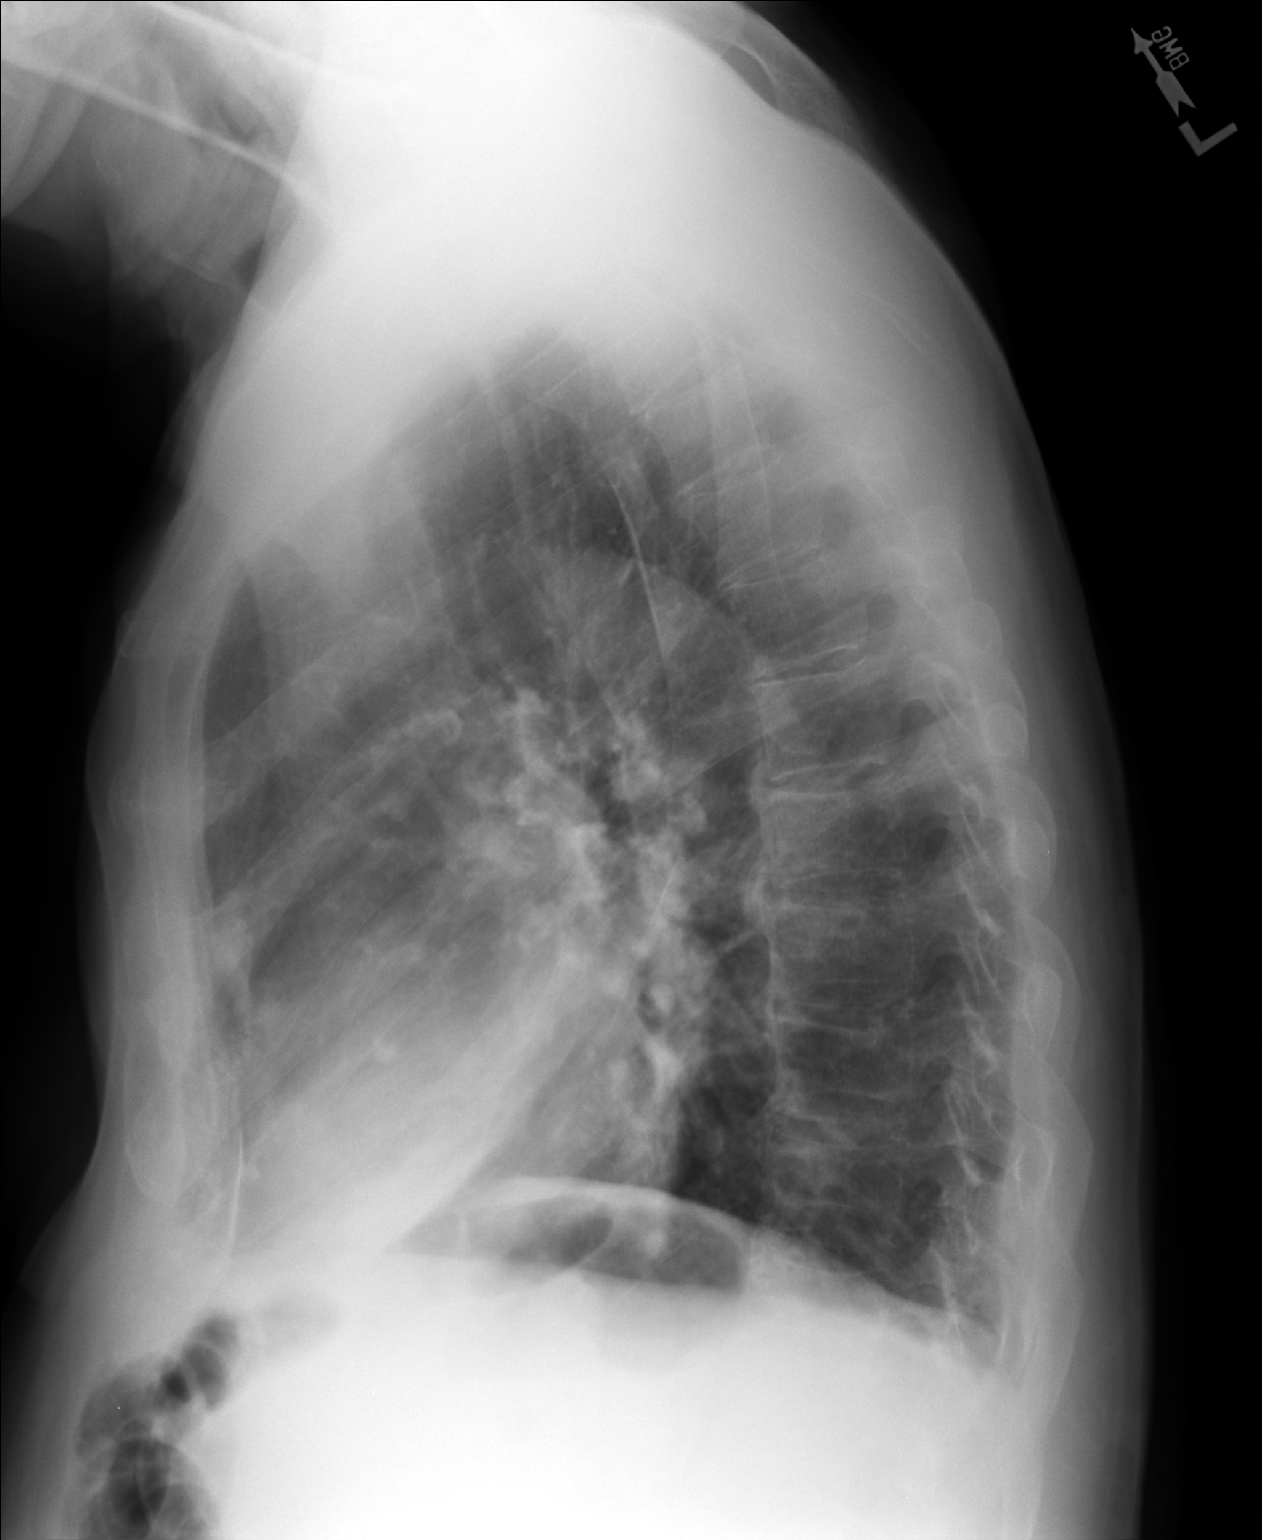

[2 of 2 positions shown; findings below may reference images not displayed]

FINDINGS: There is slight cardiac silhouette enlargement. There is
pectus excavatum which may account for this.  Minimal ectasia of
thoracic aorta is seen.  There is slight flattening of the
diaphragm on lateral image with minimal hyperinflation
configuration.  There is atelectasis and new infiltrative density
within the right medial base on the PA image.  This appears
anterior on the lateral image consistent with associated with the
right middle lobe.  No other pulmonary infiltrative densities are
evident. No pleural abnormality is evident.  There is slightly
osteopenic appearance of the bones.  Osteophytes are seen in the
spine.
IMPRESSION: Slight cardiac silhouette enlargement. There is pectus excavatum
which may account for this.  Slight flattening of diaphragm on
lateral image with minimal hyperinflation configuration.
Atelectasis and new infiltrative densities are present within the
medial aspect of the right lung base on the PA image and appear
anterior on the lateral image consistent with being within the
right middle lobe.  No consolidation or pleural effusion is seen.

Clinically significant discrepancy from primary report, if
provided: None

## 2014-06-09 ENCOUNTER — Other Ambulatory Visit: Payer: Self-pay | Admitting: Internal Medicine

## 2014-06-14 ENCOUNTER — Other Ambulatory Visit: Payer: Self-pay | Admitting: *Deleted

## 2014-06-14 DIAGNOSIS — M25562 Pain in left knee: Principal | ICD-10-CM

## 2014-06-14 DIAGNOSIS — M25561 Pain in right knee: Secondary | ICD-10-CM

## 2014-06-14 MED ORDER — HYDROCODONE-ACETAMINOPHEN 5-325 MG PO TABS: ORAL_TABLET | ORAL | Status: DC

## 2014-06-14 NOTE — Telephone Encounter (Signed)
Patient called and requested and will pick up at [facility] tomorrow. Rx given to [employer]

## 2014-07-19 ENCOUNTER — Other Ambulatory Visit: Payer: Self-pay | Admitting: Nurse Practitioner

## 2014-07-19 DIAGNOSIS — M25561 Pain in right knee: Secondary | ICD-10-CM

## 2014-07-19 DIAGNOSIS — M25562 Pain in left knee: Principal | ICD-10-CM

## 2014-07-19 MED ORDER — HYDROCODONE-ACETAMINOPHEN 5-325 MG PO TABS: ORAL_TABLET | ORAL | Status: DC

## 2014-07-19 NOTE — Telephone Encounter (Signed)
Patient will send someone by the office to pick-up, our CMA will not be traveling to [facility] this week

## 2014-07-28 ENCOUNTER — Other Ambulatory Visit: Payer: Self-pay | Admitting: Internal Medicine

## 2014-08-02 LAB — LIPID PANEL
Cholesterol: 179 mg/dL (ref 0–200)
HDL: 65 mg/dL (ref 35–70)
LDL Cholesterol: 99 mg/dL
LDl/HDL Ratio: 2.8
Triglycerides: 75 mg/dL (ref 40–160)

## 2014-08-02 LAB — HEPATIC FUNCTION PANEL
ALT: 13 U/L (ref 10–40)
AST: 17 U/L (ref 14–40)
Alkaline Phosphatase: 48 U/L (ref 25–125)
BILIRUBIN, TOTAL: 0.7 mg/dL

## 2014-08-02 LAB — BASIC METABOLIC PANEL
BUN: 22 mg/dL — AB (ref 4–21)
Creatinine: 1.4 mg/dL — AB (ref 0.6–1.3)
Glucose: 78 mg/dL
POTASSIUM: 4.7 mmol/L (ref 3.4–5.3)
Sodium: 139 mmol/L (ref 137–147)

## 2014-08-10 ENCOUNTER — Non-Acute Institutional Stay: Payer: Medicare Other | Admitting: Internal Medicine

## 2014-08-10 ENCOUNTER — Encounter: Payer: Self-pay | Admitting: Internal Medicine

## 2014-08-10 VITALS — BP 118/64 | HR 72 | Temp 97.1°F | Wt 176.0 lb

## 2014-08-10 DIAGNOSIS — M25562 Pain in left knee: Secondary | ICD-10-CM

## 2014-08-10 DIAGNOSIS — D649 Anemia, unspecified: Secondary | ICD-10-CM

## 2014-08-10 DIAGNOSIS — I739 Peripheral vascular disease, unspecified: Secondary | ICD-10-CM

## 2014-08-10 DIAGNOSIS — E785 Hyperlipidemia, unspecified: Secondary | ICD-10-CM

## 2014-08-10 DIAGNOSIS — M25561 Pain in right knee: Secondary | ICD-10-CM

## 2014-08-10 DIAGNOSIS — I350 Nonrheumatic aortic (valve) stenosis: Secondary | ICD-10-CM

## 2014-08-10 DIAGNOSIS — G609 Hereditary and idiopathic neuropathy, unspecified: Secondary | ICD-10-CM

## 2014-08-10 NOTE — Addendum Note (Signed)
Addended by: Estill Dooms on: 08/10/2014 10:43 AM   Modules accepted: Level of Service

## 2014-08-10 NOTE — Progress Notes (Signed)
Patient ID: Justin Deleon, male   DOB: 08-30-1925, 81 y.o.   MRN: [id number]    [place] of Service: Clinic (12)    No Known Allergies  Chief Complaint  Patient presents with  . Medical Management of Chronic Issues    knee pain, peripheral neuopathy, anemia, depression, blood pressure    HPI:  Hereditary and idiopathic peripheral neuropathy: He continues to feel like this is his biggest problem. He does feel the gabapentin is helping. He is getting some sleep at night.  Moderate to severe aortic stenosis: Mild dyspnea with exertion. Murmurs unchanged.  Peripheral vascular disease: Diminished DP and PT in both legs  Hyperlipemia: Controlled  Pain in both knees: Says he cannot work with the NuStep machine anymore because his legs and knees hurt too much afterwards. Using a 4 wheel walker.  Anemia, unspecified anemia type: Checked in 2015 and nearly normal following an acute blood loss anemia. Should follow-up next visit.    Medications: Patient's Medications  New Prescriptions   No medications on file  Previous Medications   BUPROPION (WELLBUTRIN XL) 300 MG 24 HR TABLET    TAKE 1 TABLET BY MOUTH EVERY DAY   CEPHALEXIN (KEFLEX) 500 MG CAPSULE    Take 500 mg by mouth. Take one twice daily for 12 days   GABAPENTIN (NEURONTIN) 300 MG CAPSULE    TAKE 6 CAPSULES AT BEDTIME TO HELP NEUROPATHIC PAINS IN FEET.   HYDROCODONE-ACETAMINOPHEN (NORCO/VICODIN) 5-325 MG PER TABLET    Take 1 tablet prior to exercise to help pain   MULTIPLE VITAMINS-MINERALS (MULTIVITAMIN WITH MINERALS) TABLET    Take 1 tablet by mouth as directed.    MULTIPLE VITAMINS-MINERALS (PRESERVISION AREDS) CAPS    Take 2 capsules by mouth daily. In morning   POLYETHYL GLYCOL-PROPYL GLYCOL (SYSTANE) 0.4-0.3 % SOLN    Apply to eye. One drop 4 times daily as needed for dry eyes   POLYETHYLENE GLYCOL POWDER (GLYCOLAX/MIRALAX) POWDER    Take 17 g by mouth daily. Mix 17 grams with 8 oz. of water  daily for laxative, in morning   RED YEAST RICE 600 MG TABS    Take 1 tablet by mouth daily. In morning   SENNA-DOCUSATE (SENNA PLUS) 8.6-50 MG PER TABLET    Take 1 tablet by mouth daily. In evening   TAMSULOSIN (FLOMAX) 0.4 MG CAPS CAPSULE    TAKE ONE CAPSULE BY MOUTH EVERY DAY.   VITAMIN D, CHOLECALCIFEROL, 1000 UNITS TABS    2 daily for vitamin D supplementation   ZOLPIDEM (AMBIEN) 10 MG TABLET    TAKE 1 TABLET BY MOUTH EVERY NIGHT AT BEDTIME  Modified Medications   No medications on file  Discontinued Medications   BUPROPION (WELLBUTRIN XL) 300 MG 24 HR TABLET    Take 300 mg by mouth every morning.   OMEPRAZOLE MAGNESIUM (PRILOSEC OTC PO)    Take by mouth. Take one daily, in morning     Review of Systems  Constitutional: Negative for fever, chills, diaphoresis, activity change, appetite change, fatigue and unexpected weight change.  HENT: Positive for hearing loss and rhinorrhea.   Eyes: Negative.        Corrective lenses  Respiratory: Negative for apnea, cough, choking, chest tightness and shortness of breath.   Cardiovascular: Negative for chest pain, palpitations and leg swelling.  Gastrointestinal: Negative.   Endocrine: Negative.   Genitourinary: Negative.   Musculoskeletal: Positive for back pain, arthralgias and gait problem.  Tender at both knees. No effusion or crepitance.  Skin: Negative for color change, pallor and wound.       AK on hands.  Skin cancers removed by Dr. Nevada Crane on the left postauricular area and the posterior neck.   Neurological: Negative.   Hematological: Bruises/bleeds easily.  Psychiatric/Behavioral: Positive for confusion.    Filed Vitals:   08/10/14 0951  BP: 118/64  Pulse: 72  Temp: 97.1 F (36.2 C)  TempSrc: Oral  Weight: 176 lb (79.833 kg)  SpO2: 95%   Body mass index is 25.25 kg/(m^2).  Physical Exam  Constitutional: He is oriented to person, place, and time. No distress.  Frail elderly male  HENT:  Nose: Nose normal.    Mouth/Throat: No oropharyngeal exudate.  Bilateral loss of hearing. Rhinorrhea.  Eyes:  Corrective lenses  Neck: Normal range of motion. Neck supple. No JVD present. No tracheal deviation present. No thyromegaly present.  Cardiovascular: Normal rate and regular rhythm.  Exam reveals no gallop and no friction rub.   Murmur heard. Grade 3/6 cooing ejection murmur  Pulmonary/Chest: No respiratory distress. He has no wheezes. He has no rales.  Abdominal: Soft. Bowel sounds are normal. He exhibits no distension and no mass. There is no tenderness.  Musculoskeletal: Normal range of motion. He exhibits no edema or tenderness.  Lymphadenopathy:    He has no cervical adenopathy.  Neurological: He is alert and oriented to person, place, and time. No cranial nerve deficit. Coordination normal.  04/14/13 MMSE: 29/30. Failed clock drawing.  Skin: No rash (at neck) noted. No erythema. No pallor.  Actinic keratosis.  Skin cancers removed from left postauricular area and posterior neck. \Healing laceration of the left forearm.  Psychiatric: He has a normal mood and affect. His behavior is normal. Thought content normal.  Some loss of memory. Poor historian.     Labs reviewed: Nursing Home on 08/10/2014  Component Date Value Ref Range Status  . Glucose 08/02/2014 78   Final  . BUN 08/02/2014 22* 4 - 21 mg/dL Final  . Creatinine 08/02/2014 1.4* 0.6 - 1.3 mg/dL Final  . Potassium 08/02/2014 4.7  3.4 - 5.3 mmol/L Final  . Sodium 08/02/2014 139  137 - 147 mmol/L Final  . LDl/HDL Ratio 08/02/2014 2.8   Final  . Triglycerides 08/02/2014 75  40 - 160 mg/dL Final  . Cholesterol 08/02/2014 179  0 - 200 mg/dL Final  . HDL 08/02/2014 65  35 - 70 mg/dL Final  . LDL Cholesterol 08/02/2014 99   Final  . Alkaline Phosphatase 08/02/2014 48  25 - 125 U/L Final  . ALT 08/02/2014 13  10 - 40 U/L Final  . AST 08/02/2014 17  14 - 40 U/L Final  . Bilirubin, Total 08/02/2014 0.7   Final     Assessment/Plan 1.  Hereditary and idiopathic peripheral neuropathy Continue current medications  2. Moderate to severe aortic stenosis Observe  3. Peripheral vascular disease Observe  4. Hyperlipemia Continue red yeast rice -CMP, future  5. Pain in both knees Observe  6. Anemia, unspecified anemia type -CBC, future

## 2014-08-17 ENCOUNTER — Encounter: Payer: Self-pay | Admitting: Internal Medicine

## 2014-08-19 ENCOUNTER — Other Ambulatory Visit: Payer: Self-pay | Admitting: *Deleted

## 2014-08-19 DIAGNOSIS — M25561 Pain in right knee: Secondary | ICD-10-CM

## 2014-08-19 DIAGNOSIS — M25562 Pain in left knee: Principal | ICD-10-CM

## 2014-08-19 MED ORDER — HYDROCODONE-ACETAMINOPHEN 5-325 MG PO TABS: ORAL_TABLET | ORAL | Status: DC

## 2014-08-19 NOTE — Telephone Encounter (Signed)
Patient Requested and [facility] Newcomer. Will take to [facility] today and Milus Banister from [facility] will pick up from there and take to patient.

## 2014-08-26 ENCOUNTER — Other Ambulatory Visit: Payer: Self-pay | Admitting: Internal Medicine

## 2014-08-26 NOTE — Telephone Encounter (Signed)
Patient requested and faxed to pharmacy 

## 2014-09-16 ENCOUNTER — Other Ambulatory Visit: Payer: Self-pay | Admitting: *Deleted

## 2014-09-16 DIAGNOSIS — M25562 Pain in left knee: Principal | ICD-10-CM

## 2014-09-16 DIAGNOSIS — M25561 Pain in right knee: Secondary | ICD-10-CM

## 2014-09-16 MED ORDER — HYDROCODONE-ACETAMINOPHEN 5-325 MG PO TABS
ORAL_TABLET | ORAL | Status: DC
Start: 2014-09-16 — End: 2014-10-07

## 2014-09-16 NOTE — Telephone Encounter (Signed)
Patient Requested and will pick up 

## 2014-09-23 ENCOUNTER — Other Ambulatory Visit: Payer: Self-pay | Admitting: Internal Medicine

## 2014-09-23 NOTE — Telephone Encounter (Signed)
Patient Requested refill to be faxed to [facility]

## 2014-10-06 ENCOUNTER — Telehealth: Payer: Self-pay

## 2014-10-06 DIAGNOSIS — M25562 Pain in left knee: Principal | ICD-10-CM

## 2014-10-06 DIAGNOSIS — M25561 Pain in right knee: Secondary | ICD-10-CM

## 2014-10-06 NOTE — Telephone Encounter (Signed)
Message left on triage voicemail: Patient would like to know if he can have rx for hydrocodone, last filled 09/16/14 #60. Patient states he know its an early request but he had to take more medication for knee pain.   Patient states his phone is messed up so it will be hard to get back in contact with him but just keep trying.   Please advise if ok to provide RX

## 2014-10-07 MED ORDER — HYDROCODONE-ACETAMINOPHEN 5-325 MG PO TABS: ORAL_TABLET | ORAL | Status: DC

## 2014-10-07 NOTE — Telephone Encounter (Signed)
It is okay to refill early on this hydrocodone.

## 2014-10-07 NOTE — Telephone Encounter (Signed)
Spoke with patient, patient states Justin Deleon will pick-up his rx tomorrow from this office.   Rx was printed x 2 (first rx was not on appropriate paper and shreded)

## 2014-10-09 ENCOUNTER — Emergency Department (HOSPITAL_COMMUNITY)
Admission: EM | Admit: 2014-10-09 | Discharge: 2014-10-09 | Disposition: A | Discharge status: Home / Self Care | Payer: Medicare Other | Attending: Emergency Medicine | Admitting: Emergency Medicine

## 2014-10-09 ENCOUNTER — Encounter (HOSPITAL_COMMUNITY): Payer: Self-pay | Admitting: *Deleted

## 2014-10-09 DIAGNOSIS — Z8659 Personal history of other mental and behavioral disorders: Secondary | ICD-10-CM | POA: Insufficient documentation

## 2014-10-09 DIAGNOSIS — Z8546 Personal history of malignant neoplasm of prostate: Secondary | ICD-10-CM | POA: Diagnosis not present

## 2014-10-09 DIAGNOSIS — Z87438 Personal history of other diseases of male genital organs: Secondary | ICD-10-CM | POA: Diagnosis not present

## 2014-10-09 DIAGNOSIS — K210 Gastro-esophageal reflux disease with esophagitis: Secondary | ICD-10-CM | POA: Insufficient documentation

## 2014-10-09 DIAGNOSIS — I10 Essential (primary) hypertension: Secondary | ICD-10-CM | POA: Insufficient documentation

## 2014-10-09 DIAGNOSIS — Z87891 Personal history of nicotine dependence: Secondary | ICD-10-CM | POA: Insufficient documentation

## 2014-10-09 DIAGNOSIS — M25562 Pain in left knee: Secondary | ICD-10-CM | POA: Insufficient documentation

## 2014-10-09 DIAGNOSIS — H269 Unspecified cataract: Secondary | ICD-10-CM | POA: Insufficient documentation

## 2014-10-09 DIAGNOSIS — Z79899 Other long term (current) drug therapy: Secondary | ICD-10-CM | POA: Diagnosis not present

## 2014-10-09 DIAGNOSIS — Z87442 Personal history of urinary calculi: Secondary | ICD-10-CM | POA: Diagnosis not present

## 2014-10-09 DIAGNOSIS — K5900 Constipation, unspecified: Secondary | ICD-10-CM | POA: Diagnosis not present

## 2014-10-09 DIAGNOSIS — M25561 Pain in right knee: Secondary | ICD-10-CM | POA: Diagnosis present

## 2014-10-09 DIAGNOSIS — Z85828 Personal history of other malignant neoplasm of skin: Secondary | ICD-10-CM | POA: Diagnosis not present

## 2014-10-09 LAB — URINALYSIS, ROUTINE W REFLEX MICROSCOPIC
Bilirubin Urine: NEGATIVE
Glucose, UA: NEGATIVE mg/dL
Hgb urine dipstick: NEGATIVE
Ketones, ur: NEGATIVE mg/dL
Leukocytes, UA: NEGATIVE
Nitrite: NEGATIVE
PH: 5.5 (ref 5.0–8.0)
Protein, ur: NEGATIVE mg/dL
SPECIFIC GRAVITY, URINE: 1.025 (ref 1.005–1.030)
Urobilinogen, UA: 0.2 mg/dL (ref 0.0–1.0)

## 2014-10-09 MED ORDER — MORPHINE SULFATE 4 MG/ML IJ SOLN
4.0000 mg | Freq: Once | INTRAMUSCULAR | Status: AC
Start: 2014-10-09 — End: 2014-10-09
  Administered 2014-10-09: 4 mg via INTRAMUSCULAR
  Filled 2014-10-09: qty 1

## 2014-10-09 MED ORDER — OXYCODONE-ACETAMINOPHEN 5-325 MG PO TABS
1.0000 | ORAL_TABLET | Freq: Once | ORAL | Status: AC
  Administered 2014-10-09: 1 via ORAL
  Filled 2014-10-09: qty 1

## 2014-10-09 MED ORDER — OXYCODONE-ACETAMINOPHEN 5-325 MG PO TABS
1.0000 | ORAL_TABLET | Freq: Four times a day (QID) | ORAL | Status: DC | PRN
Start: 2014-10-09 — End: 2014-10-15

## 2014-10-09 NOTE — ED Provider Notes (Signed)
CSN: [id number]     Arrival date & time 10/09/14  1632 History   First MD Initiated Contact with Patient 10/09/14 1646     Chief Complaint  Patient presents with  . Knee Pain     (Consider location/radiation/quality/duration/timing/severity/associated sxs/prior Treatment) HPI  Pt presents with c/o worsening pain in his knees.  He states pain is bilateral, pain is constant and has been ongoing for several months per chart review.  No new trauma or falls.  No redness or swelling of knees.  He takes gabentin for his peripheral neuropathy and states feet do not hurt him as much, but his knees are the primary source of pain.  He was prescribed vicodin by his PMD for the knee pain, and has had to increase the number he is taking each day due to worsening pain over time.  [facility] He walks with a walker at his baseline. There are no other associated systemic symptoms, there are no other alleviating or modifying factors.   Past Medical History  Diagnosis Date  . Depressed   . Hernia   . Kidney stones   . Hypertension   . Prostate cancer   . Cataract 2012    Mohs MD  . Hypertrophy of prostate with urinary obstruction and other lower urinary tract symptoms (LUTS) 08/26/2012  . Edema 08/26/2012  . Ectropion, unspecified 05/27/2012  . Abnormality of gait 11/27/2011  . Unspecified hereditary and idiopathic peripheral neuropathy 08/28/2011  . Basal cell carcinoma of scalp and skin of neck 06/26/2011  . Pain in limb 05/28/2011  . Closed fracture of midcervical section of femur 05/28/2011  . Other and unspecified hyperlipidemia 04/06/2011  . Anemia, unspecified 04/06/2011  . Peripheral vascular disease, unspecified 04/06/2011  . Reflux esophagitis 04/06/2011  . Unspecified constipation 04/06/2011  . Insomnia, unspecified 04/06/2011  . Undiagnosed cardiac murmurs 04/06/2011  . Hypertrophy of prostate with urinary obstruction and other lower urinary tract symptoms (LUTS)  2013  . Depression, acute   . Aortic valve stenosis, rheumatic    Past Surgical History  Procedure Laterality Date  . Cholecystectomy  1963/1978  . Hernia repair  8182    umbilical  . Spine surgery  2010    lumbar-L3-4/L4-5  . Fracture surgery Left 03/2011    hip  . Eye surgery      retina repair right  . Esophagogastroduodenoscopy N/A 04/17/2013    Procedure: ESOPHAGOGASTRODUODENOSCOPY (EGD);  Surgeon: Wonda Horner, MD;  Location[facility] ENDOSCOPY;  Service: Endoscopy;  Laterality: N/A;  . Colonoscopy N/A 04/21/2013    Procedure: COLONOSCOPY;  Surgeon: Jeryl Columbia, MD;  Location: [facility] ENDOSCOPY;  Service: Endoscopy;  Laterality: N/A;  . Skin cancer excision  11/10/13    2 lesions removed back of neck Dr. Nevada Crane  . Skin cancer excision  03/23/14    below left ear   Family History  Problem Relation Age of Onset  . Heart disease Father     MI  . Cancer Sister     bladder   History  Substance Use Topics  . Smoking status: Former Smoker    Types: Cigars    Quit date: 12/10/1987  . Smokeless tobacco: Never Used  . Alcohol Use: 0.6 oz/week    1 Cans of beer per week     Comment: 1 week    Review of Systems  ROS reviewed and all otherwise negative except for mentioned in HPI    Allergies  Review of patient's  allergies indicates no known allergies.  Home Medications   Prior to Admission medications   Medication Sig Start Date End Date Taking? Authorizing Provider  buPROPion (WELLBUTRIN XL) 300 MG 24 hr tablet TAKE 1 TABLET BY MOUTH EVERY DAY 06/09/14  Yes Estill Dooms, MD  erythromycin ophthalmic ointment Place 1 application into both eyes at bedtime.   Yes Historical Provider, MD  gabapentin (NEURONTIN) 300 MG capsule TAKE 6 CAPSULES AT BEDTIME TO HELP NEUROPATHIC PAINS IN FEET. Patient taking differently: takes 6 to 8 by mouth at bedtime to help with pain 04/22/14  Yes Lauree Chandler, NP  HYDROcodone-acetaminophen (NORCO/VICODIN) 5-325 MG per tablet Take 1 tablet prior  to exercise to help pain 10/07/14  Yes Lauree Chandler, NP  Multiple Vitamins-Minerals (MULTIVITAMIN WITH MINERALS) tablet Take 1 tablet by mouth daily.    Yes Historical Provider, MD  Multiple Vitamins-Minerals (PRESERVISION AREDS) CAPS Take 2 capsules by mouth daily. In morning   Yes Historical Provider, MD  omeprazole (PRILOSEC) 20 MG capsule Take 20 mg by mouth daily.   Yes Historical Provider, MD  Polyethyl Glycol-Propyl Glycol (SYSTANE) 0.4-0.3 % SOLN Apply to eye. One drop 4 times daily as needed for dry eyes   Yes Historical Provider, MD  Red Yeast Rice 600 MG TABS Take 1 tablet by mouth daily. In morning   Yes Historical Provider, MD  senna-docusate (SENNA PLUS) 8.6-50 MG per tablet Take 1 tablet by mouth daily. In evening   Yes Historical Provider, MD  tamsulosin (FLOMAX) 0.4 MG CAPS capsule TAKE ONE CAPSULE BY MOUTH EVERY DAY. 07/19/14  Yes Estill Dooms, MD  vitamin C (ASCORBIC ACID) 500 MG tablet Take 500 mg by mouth daily.   Yes Historical Provider, MD  Vitamin D, Cholecalciferol, 1000 UNITS TABS 2 daily for vitamin D supplementation Patient taking differently: Take 1,000 mg by mouth daily.  06/09/13  Yes Estill Dooms, MD  vitamin E 400 UNIT capsule Take 400 Units by mouth daily.   Yes Historical Provider, MD  zolpidem (AMBIEN) 10 MG tablet Take one tablet by mouth once daily at bedtime for sleep 09/23/14  Yes Lauree Chandler, NP  cephALEXin (KEFLEX) 500 MG capsule Take 500 mg by mouth. Take one twice daily for 12 days    Historical Provider, MD  HYDROcodone-acetaminophen (NORCO/VICODIN) 5-325 MG per tablet Take 1 tablet prior to exercise to help pain Patient not taking: Reported on 10/09/2014 10/07/14   Lauree Chandler, NP  oxyCODONE-acetaminophen (PERCOCET/ROXICET) 5-325 MG per tablet Take 1-2 tablets by mouth every 6 (six) hours as needed for severe pain. 10/09/14   Alfonzo Beers, MD   BP 128/70 mmHg  Pulse 78  Temp(Src) 98.4 F (36.9 C) (Oral)  Resp 18  SpO2 95%  Vitals  reviewed Physical Exam  Physical Examination: General appearance - alert, well appearing, and in no distress Mental status - alert, oriented to person, place, not to time Eyes - no conjunctival injection, no scleral icterus Chest - normal respiratory effort Heart - normal rate, regular rhythm, normal S1, S2, no murmurs, rubs, clicks or gallops Neurological - alert, oriented, normal speech, strength 5/5 in extremities x 4, sensation intact Musculoskeletal - no joint tenderness, deformity or swelling, FROM of knees without pain- pt states bilateral knee pain is constant, no effusions or swelling of knees or lower extremities Extremities - peripheral pulses normal, no pedal edema, no clubbing or cyanosis Skin - normal coloration and turgor, no rashes, no overlying warmth or erythema of knees  ED Course  Procedures (including critical care time)  7:12 PM  D/w patient and family at length.  Pt states his knees continue to hurt and that he wants to be admitted.  I have d/w them that we will give him a stronger prescription for pain to use at home.   Labs Review Labs Reviewed  URINALYSIS, ROUTINE W REFLEX MICROSCOPIC    Imaging Review No results found.   EKG Interpretation None      MDM   Final diagnoses:  Knee pain, bilateral    Pt presenting with bilateral knee pain.  Pain has been ongoing for several months per family and prior chart review.  Pt has peripheral neuropathy and takes 1800mg  neurontin daily- he also takes vicodin and has been taking more frequent vicodin doses over past week due to increased pain.  Knee exam is normal- no swelling or erythema- doubt septic joint, trauamatic injury.  Pt given percocet which did not provide much relief- given IM morphine injection as well. Pt continues to c/o pain.  i will give percocet rx- and advised to f/u with PMD and with orthopedics.  i suspect there is ongoing peripheral neuropathy as on recheck pt is stating that his lower  extremities hurt from knees down to feet.  Also may be a component of arthritis.  No indication for imaging at this time.  Family is agreeable with plan.  Pt is upset that his pain is ongoing but I am hesitant to give higher doses of pain meds as this may result in a fall in this elderly patient.  Pt will need ongoing followup for this chronic pain issue.  Discharged with strict return precautions.  Pt agreeable with plan.    Alfonzo Beers, MD 10/09/14 [phone]

## 2014-10-09 NOTE — Discharge Instructions (Signed)
Return to the ED with any concerns including redness or swelling of knee with fever, swelling of legs, decreased level of alertness/lethargy, or any other alarming symptoms  You should take percocet (oxycodone) as needed for more severe pain- do not take both percocet and vicodin (hydrocodone) at the same time as they both contain tylenol

## 2014-10-09 NOTE — ED Notes (Signed)
Bed: YE33 Expected date:  Expected time:  Means of arrival:  Comments: EMS knee pain

## 2014-10-09 NOTE — ED Notes (Signed)
Pt from [facility] via EMS-Per EMS, pt c/o bilateral knee pain that just started yesterday. Pt denies injury. No swelling noted. Pt has hx of neuropathy. Pt is A&O and in NAD

## 2014-10-11 ENCOUNTER — Other Ambulatory Visit: Payer: Self-pay | Admitting: *Deleted

## 2014-10-11 MED ORDER — GABAPENTIN 300 MG PO CAPS: ORAL_CAPSULE | ORAL | Status: DC

## 2014-10-11 NOTE — Telephone Encounter (Signed)
Patient daughter, Lelon Frohlich requested due to leg pain. Appointment scheduled with Dr. Nyoka Cowden 10/12/14.

## 2014-10-12 ENCOUNTER — Encounter: Payer: Self-pay | Admitting: Internal Medicine

## 2014-10-12 ENCOUNTER — Non-Acute Institutional Stay: Payer: Medicare Other | Admitting: Internal Medicine

## 2014-10-12 VITALS — BP 114/60 | HR 80 | Temp 97.5°F | Wt 174.0 lb

## 2014-10-12 DIAGNOSIS — M25562 Pain in left knee: Secondary | ICD-10-CM | POA: Diagnosis not present

## 2014-10-12 DIAGNOSIS — G609 Hereditary and idiopathic neuropathy, unspecified: Secondary | ICD-10-CM

## 2014-10-12 DIAGNOSIS — M25561 Pain in right knee: Secondary | ICD-10-CM | POA: Diagnosis not present

## 2014-10-12 DIAGNOSIS — F329 Major depressive disorder, single episode, unspecified: Secondary | ICD-10-CM | POA: Diagnosis not present

## 2014-10-12 DIAGNOSIS — F32A Depression, unspecified: Secondary | ICD-10-CM

## 2014-10-12 MED ORDER — DULOXETINE HCL 30 MG PO CPEP: ORAL_CAPSULE | ORAL | Status: AC

## 2014-10-12 NOTE — Progress Notes (Signed)
Patient ID: Justin Deleon, male   DOB: Dec 02, 1925, 81 y.o.   MRN: [id number]    [place] of Service: Clinic (12)    No Known Allergies  Chief Complaint  Patient presents with  . Hospitalization Follow-up    went to ER 10-09-14 with knee pain. Had stopped gabapentin was running out. Got Percocet in ER. Got refill on Gabapentin. Takes the Gabapentin with Hydrocodone at night, knees feel ok. Here with son Remo Lipps    HPI:  Patient went to the emergency room 10/09/14 for knee pains. Prior to this trip to the emergency room, he had stopped his gabapentin because he was running out. He did use extra doses of gabapentin beyond the prescription directions. Patient was seen by the physician in the emergency department. He got Percocet and a refill on gabapentin. His knees are now feeling better.  Depression (emotion): Patient is aware that he is depressed at times.  Hereditary and idiopathic peripheral neuropathy: Patient has chronic neuropathic pains in the legs and feet. It is difficult to tell whether the pains that he has saying are in his knees involve more of the lower leg as well. He has an appointment see Dr. Jannifer Franklin 10/15/14.    Medications: Patient's Medications  New Prescriptions   No medications on file  Previous Medications   BUPROPION (WELLBUTRIN XL) 300 MG 24 HR TABLET    TAKE 1 TABLET BY MOUTH EVERY DAY   CEPHALEXIN (KEFLEX) 500 MG CAPSULE    Take 500 mg by mouth. Take one twice daily for 12 days   ERYTHROMYCIN OPHTHALMIC OINTMENT    Place 1 application into both eyes at bedtime.   GABAPENTIN (NEURONTIN) 300 MG CAPSULE    Take 6 to 8 capsules at bedtime to help neuropathic pains in feet.   HYDROCODONE-ACETAMINOPHEN (NORCO/VICODIN) 5-325 MG PER TABLET    Take 1 tablet prior to exercise to help pain   MULTIPLE VITAMINS-MINERALS (MULTIVITAMIN WITH MINERALS) TABLET    Take 1 tablet by mouth daily.    MULTIPLE VITAMINS-MINERALS (PRESERVISION AREDS) CAPS     Take 2 capsules by mouth daily. In morning   OMEPRAZOLE (PRILOSEC) 20 MG CAPSULE    Take 20 mg by mouth daily.   OXYCODONE-ACETAMINOPHEN (PERCOCET/ROXICET) 5-325 MG PER TABLET    Take 1-2 tablets by mouth every 6 (six) hours as needed for severe pain.   POLYETHYL GLYCOL-PROPYL GLYCOL (SYSTANE) 0.4-0.3 % SOLN    Apply to eye. One drop 4 times daily as needed for dry eyes   RED YEAST RICE 600 MG TABS    Take 1 tablet by mouth daily. In morning   SENNA-DOCUSATE (SENNA PLUS) 8.6-50 MG PER TABLET    Take 1 tablet by mouth daily. In evening   TAMSULOSIN (FLOMAX) 0.4 MG CAPS CAPSULE    TAKE ONE CAPSULE BY MOUTH EVERY DAY.   VITAMIN C (ASCORBIC ACID) 500 MG TABLET    Take 500 mg by mouth daily.   VITAMIN D, CHOLECALCIFEROL, 1000 UNITS TABS    2 daily for vitamin D supplementation   VITAMIN E 400 UNIT CAPSULE    Take 400 Units by mouth daily.   ZOLPIDEM (AMBIEN) 10 MG TABLET    Take one tablet by mouth once daily at bedtime for sleep  Modified Medications   No medications on file  Discontinued Medications   HYDROCODONE-ACETAMINOPHEN (NORCO/VICODIN) 5-325 MG PER TABLET    Take 1 tablet prior to exercise to help pain  Review of Systems  Constitutional: Negative for fever, chills, diaphoresis, activity change, appetite change, fatigue and unexpected weight change.  HENT: Positive for hearing loss and rhinorrhea.   Eyes: Negative.        Corrective lenses  Respiratory: Negative for apnea, cough, choking, chest tightness and shortness of breath.   Cardiovascular: Negative for chest pain, palpitations and leg swelling.  Gastrointestinal: Negative.   Endocrine: Negative.   Genitourinary: Negative.   Musculoskeletal: Positive for back pain, arthralgias and gait problem.       Tender at both knees. No effusion or crepitance.  Skin: Negative for color change, pallor and wound.       AK on hands.  Skin cancers removed by Dr. Nevada Crane on the left postauricular area and the posterior neck.   Neurological:  Negative.   Hematological: Bruises/bleeds easily.  Psychiatric/Behavioral: Positive for confusion and dysphoric mood. Negative for behavioral problems, sleep disturbance and agitation. The patient is not nervous/anxious.     Filed Vitals:   10/12/14 0916  BP: 114/60  Pulse: 80  Temp: 97.5 F (36.4 C)  TempSrc: Oral  Weight: 174 lb (78.926 kg)  SpO2: 95%   Body mass index is 24.97 kg/(m^2).  Physical Exam  Constitutional: He is oriented to person, place, and time. No distress.  Frail elderly male  HENT:  Nose: Nose normal.  Mouth/Throat: No oropharyngeal exudate.  Bilateral loss of hearing. Rhinorrhea.  Eyes:  Corrective lenses  Neck: Normal range of motion. Neck supple. No JVD present. No tracheal deviation present. No thyromegaly present.  Cardiovascular: Normal rate and regular rhythm.  Exam reveals no gallop and no friction rub.   Murmur heard. Grade 3/6 cooing ejection murmur  Pulmonary/Chest: No respiratory distress. He has no wheezes. He has no rales.  Abdominal: Soft. Bowel sounds are normal. He exhibits no distension and no mass. There is no tenderness.  Musculoskeletal: Normal range of motion. He exhibits tenderness (both knees). He exhibits no edema.  Lymphadenopathy:    He has no cervical adenopathy.  Neurological: He is alert and oriented to person, place, and time. No cranial nerve deficit. Coordination normal.  04/14/13 MMSE: 29/30. Failed clock drawing.  Skin: No rash (at neck) noted. No erythema. No pallor.  Actinic keratosis.  Skin cancers removed from left postauricular area and posterior neck. \Healing laceration of the left forearm.  Psychiatric: His behavior is normal. Thought content normal.  Some loss of memory. Poor historian. Mild depression.     Labs reviewed: Admission on 10/09/2014, Discharged on 10/09/2014  Component Date Value Ref Range Status  . Color, Urine 10/09/2014 YELLOW  YELLOW Final  . APPearance 10/09/2014 CLEAR  CLEAR Final  .  Specific Gravity, Urine 10/09/2014 1.025  1.005 - 1.030 Final  . pH 10/09/2014 5.5  5.0 - 8.0 Final  . Glucose, UA 10/09/2014 NEGATIVE  NEGATIVE mg/dL Final  . Hgb urine dipstick 10/09/2014 NEGATIVE  NEGATIVE Final  . Bilirubin Urine 10/09/2014 NEGATIVE  NEGATIVE Final  . Ketones, ur 10/09/2014 NEGATIVE  NEGATIVE mg/dL Final  . Protein, ur 10/09/2014 NEGATIVE  NEGATIVE mg/dL Final  . Urobilinogen, UA 10/09/2014 0.2  0.0 - 1.0 mg/dL Final  . Nitrite 10/09/2014 NEGATIVE  NEGATIVE Final  . Leukocytes, UA 10/09/2014 NEGATIVE  NEGATIVE Final   MICROSCOPIC NOT DONE ON URINES WITH NEGATIVE PROTEIN, BLOOD, LEUKOCYTES, NITRITE, OR GLUCOSE <1000 mg/dL.  Nursing Home on 08/10/2014  Component Date Value Ref Range Status  . Glucose 08/02/2014 78   Final  . BUN 08/02/2014 22*  4 - 21 mg/dL Final  . Creatinine 08/02/2014 1.4* 0.6 - 1.3 mg/dL Final  . Potassium 08/02/2014 4.7  3.4 - 5.3 mmol/L Final  . Sodium 08/02/2014 139  137 - 147 mmol/L Final  . LDl/HDL Ratio 08/02/2014 2.8   Final  . Triglycerides 08/02/2014 75  40 - 160 mg/dL Final  . Cholesterol 08/02/2014 179  0 - 200 mg/dL Final  . HDL 08/02/2014 65  35 - 70 mg/dL Final  . LDL Cholesterol 08/02/2014 99   Final  . Alkaline Phosphatase 08/02/2014 48  25 - 125 U/L Final  . ALT 08/02/2014 13  10 - 40 U/L Final  . AST 08/02/2014 17  14 - 40 U/L Final  . Bilirubin, Total 08/02/2014 0.7   Final     Assessment/Plan  1. Pain in both knee - DULoxetine (CYMBALTA) 30 MG capsule; One daily to help depression and pains  Dispense: 30 capsule; Refill: 3  2. Depression (emotion) - DULoxetine (CYMBALTA) 30 MG capsule; One daily to help depression and pains  Dispense: 30 capsule; Refill: 3  3. Hereditary and idiopathic peripheral neuropathy - DULoxetine (CYMBALTA) 30 MG capsule; One daily to help depression and pains  Dispense: 30 capsule; Refill: 3

## 2014-10-15 ENCOUNTER — Encounter: Payer: Self-pay | Admitting: Neurology

## 2014-10-15 ENCOUNTER — Ambulatory Visit (INDEPENDENT_AMBULATORY_CARE_PROVIDER_SITE_OTHER): Payer: Medicare Other | Admitting: Neurology

## 2014-10-15 VITALS — BP 122/74 | HR 76 | Ht 70.0 in | Wt 177.0 lb

## 2014-10-15 DIAGNOSIS — M25562 Pain in left knee: Secondary | ICD-10-CM

## 2014-10-15 DIAGNOSIS — M25561 Pain in right knee: Secondary | ICD-10-CM | POA: Diagnosis not present

## 2014-10-15 DIAGNOSIS — G609 Hereditary and idiopathic neuropathy, unspecified: Secondary | ICD-10-CM | POA: Diagnosis not present

## 2014-10-15 DIAGNOSIS — R269 Unspecified abnormalities of gait and mobility: Secondary | ICD-10-CM

## 2014-10-15 DIAGNOSIS — I350 Nonrheumatic aortic (valve) stenosis: Secondary | ICD-10-CM | POA: Diagnosis not present

## 2014-10-15 MED ORDER — GABAPENTIN 300 MG PO CAPS: ORAL_CAPSULE | ORAL | Status: DC

## 2014-10-15 NOTE — Progress Notes (Signed)
Reason for visit:  Leg pain  Referring physician:  Dr. Gae Gallop is a 81 y.o. male  History of present illness:   Justin Deleon is an 80 year old right-handed white male with a history of chronic lower extremity discomfort that is felt to be related to a peripheral neuropathy. The patient indicates that he has a constant achy pain below the knees to the feet. This has been present for least 5 years, he was seen through this office in October 2011. No workup was obtained, however. The patient never had blood work or EMG or nerve conduction studies. The patient has had a fall in 2011, and fractured the left hip. He has been using a walker for ambulation since that time. He does have significant gait instability, he is unable to ambulate without the walker. The patient has not had any recent falls. He denies any back pain, he indicates that the bowels and the bladder are working well. He denies a neck or arm discomfort. He has no numbness in the hands, but he does have numbness in the feet. He currently is on 8 of the 300 mg gabapentin tablets at night , and he takes 2 or 3 hydrocodone tablets during the day for pain. He takes Ambien at night for sleep. The patient reports that his legs hurt all day long. He also reports bilateral knee discomfort that is a separate type of pain than he gets in his lower extremities. He indicates that the leg pain is worse in the evening. He is sent to this office for evaluation. The patient has had a vitamin B12 level done 2 years ago that was unremarkable. He currently is on Cymbalta. This was just started.  Past Medical History  Diagnosis Date  . Depressed   . Hernia   . Kidney stones   . Hypertension   . Prostate cancer   . Cataract 2012    Mohs MD  . Hypertrophy of prostate with urinary obstruction and other lower urinary tract symptoms (LUTS) 08/26/2012  . Edema 08/26/2012  . Ectropion, unspecified 05/27/2012  . Abnormality of gait  11/27/2011  . Unspecified hereditary and idiopathic peripheral neuropathy 08/28/2011  . Basal cell carcinoma of scalp and skin of neck 06/26/2011  . Pain in limb 05/28/2011  . Closed fracture of midcervical section of femur 05/28/2011  . Other and unspecified hyperlipidemia 04/06/2011  . Anemia, unspecified 04/06/2011  . Peripheral vascular disease, unspecified 04/06/2011  . Reflux esophagitis 04/06/2011  . Unspecified constipation 04/06/2011  . Insomnia, unspecified 04/06/2011  . Undiagnosed cardiac murmurs 04/06/2011  . Hypertrophy of prostate with urinary obstruction and other lower urinary tract symptoms (LUTS) 2013  . Depression, acute   . Aortic valve stenosis, rheumatic   . Kidney stones     Past Surgical History  Procedure Laterality Date  . Cholecystectomy  1963/1978  . Hernia repair  9811    umbilical  . Spine surgery  2010    lumbar-L3-4/L4-5  . Fracture surgery Left 03/2011    hip  . Eye surgery      retina repair right  . Esophagogastroduodenoscopy N/A 04/17/2013    Procedure: ESOPHAGOGASTRODUODENOSCOPY (EGD);  Surgeon: Wonda Horner, MD;  Location: [facility] ENDOSCOPY;  Service: Endoscopy;  Laterality: N/A;  . Colonoscopy N/A 04/21/2013    Procedure: COLONOSCOPY;  Surgeon: Jeryl Columbia, MD;  Location: [facility] ENDOSCOPY;  Service: Endoscopy;  Laterality: N/A;  . Skin cancer excision  11/10/13    2 lesions  removed back of neck Dr. Nevada Crane  . Skin cancer excision  03/23/14    below left ear    Family History  Problem Relation Age of Onset  . Heart disease Father     MI  . Cancer Sister     bladder    Social history:  reports that he quit smoking about 26 years ago. His smoking use included Cigars. He has never used smokeless tobacco. He reports that he drinks about 0.6 oz of alcohol per week. He reports that he does not use illicit drugs.  Medications:  Prior to Admission medications   Medication Sig Start Date End Date Taking? Authorizing Provider  DULoxetine (CYMBALTA) 30 MG  capsule One daily to help depression and pains 10/12/14  Yes Estill Dooms, MD  erythromycin ophthalmic ointment Place 1 application into both eyes at bedtime.   Yes Historical Provider, MD  gabapentin (NEURONTIN) 300 MG capsule 2 capsules in the morning and midday, take 4 capsules at night 10/15/14  Yes Kathrynn Ducking, MD  HYDROcodone-acetaminophen (NORCO/VICODIN) 5-325 MG per tablet Take 1 tablet prior to exercise to help pain 10/07/14  Yes Lauree Chandler, NP  Multiple Vitamins-Minerals (MULTIVITAMIN WITH MINERALS) tablet Take 1 tablet by mouth daily.    Yes Historical Provider, MD  Multiple Vitamins-Minerals (PRESERVISION AREDS) CAPS Take 2 capsules by mouth daily. In morning   Yes Historical Provider, MD  omeprazole (PRILOSEC) 20 MG capsule Take 20 mg by mouth daily.   Yes Historical Provider, MD  Polyethyl Glycol-Propyl Glycol (SYSTANE) 0.4-0.3 % SOLN Apply to eye. One drop 4 times daily as needed for dry eyes   Yes Historical Provider, MD  polyethylene glycol (MIRALAX / GLYCOLAX) packet Take 17 g by mouth daily.   Yes Historical Provider, MD  Red Yeast Rice 600 MG TABS Take 1 tablet by mouth daily. In morning   Yes Historical Provider, MD  senna-docusate (SENNA PLUS) 8.6-50 MG per tablet Take 1 tablet by mouth daily. In evening   Yes Historical Provider, MD  tamsulosin (FLOMAX) 0.4 MG CAPS capsule TAKE ONE CAPSULE BY MOUTH EVERY DAY. 07/19/14  Yes Estill Dooms, MD  Vitamin D, Cholecalciferol, 1000 UNITS TABS 2 daily for vitamin D supplementation Patient taking differently: Take 1,000 mg by mouth daily.  06/09/13  Yes Estill Dooms, MD  zolpidem (AMBIEN) 10 MG tablet Take one tablet by mouth once daily at bedtime for sleep 09/23/14  Yes Lauree Chandler, NP     No Known Allergies  ROS:  Out of a complete 14 system review of symptoms, the patient complains only of the following symptoms, and all other reviewed systems are negative.   Hearing loss  Loss of vision  Urination problems   Memory loss, confusion  Disinterest in activities  Blood pressure 122/74, pulse 76, height 5\' 10"  (1.778 m), weight 177 lb (80.287 kg).  Physical Exam  General: The patient is alert and cooperative at the time of the examination.  Eyes: Pupils are equal, round, and reactive to light. Discs are flat bilaterally.  Neck: The neck is supple, bilateral cardiac murmur radiations are noted.  Respiratory: The respiratory examination is clear.  Cardiovascular: The cardiovascular examination reveals a regular rate and rhythm, with a grade III/VI systolic ejection murmur that is maximal in the aortic area..  Skin: Extremities are without significant edema.  Neurologic Exam  Mental status: The patient is alert and oriented x 3 at the time of the examination. The patient has apparent normal  recent and remote memory, with an apparently normal attention span and concentration ability.  Cranial nerves: Facial symmetry is present. There is good sensation of the face to pinprick and soft touch bilaterally. The strength of the facial muscles and the muscles to head turning and shoulder shrug are normal bilaterally. Speech is well enunciated, no aphasia or dysarthria is noted. Extraocular movements are full. Visual fields are full. The tongue is midline, and the patient has symmetric elevation of the soft palate. No obvious hearing deficits are noted.  Motor: The motor testing reveals 5 over 5 strength of all 4 extremities. Good symmetric motor tone is noted throughout.  Sensory: Sensory testing is intact to pinprick, soft touch, vibration sensation, and position sense on the upper extremities.  With the lower extremities, there is a stocking pattern pinprick sensory deficit across the ankles bilaterally. Significant impairment of vibration and position sense are noted in both feet. No evidence of extinction is noted.  Coordination: Cerebellar testing reveals good finger-nose-finger and heel-to-shin  bilaterally.  Gait and station: Gait is wide-based, unsteady. The patient uses a walker for ambulation. Tandem gait was not attempted. Romberg is negative. No drift is seen.  Reflexes: Deep tendon reflexes are symmetric , but are depressed bilaterally. Toes are downgoing bilaterally.   Assessment/Plan:   1. Probable peripheral neuropathy  2. Gait disturbance    3. Bilateral knee discomfort    The patient will be sent for x-rays of the knees bilaterally. He is taking a large dose of gabapentin at night, dosing the medication such as this does not allow for good absorption of the drug. We will need to split the dosing of the drug , taking 600 mg in the morning and midday, and 1200 mg at night. The patient is on Cymbalta, this dose can be increased gradually over time.  Justin Alexanders MD 10/17/2014 5:32 PM  [facility] [address] [place], [place] [address]  Phone [phone] Fax [phone]  so all we can do

## 2014-10-15 NOTE — Patient Instructions (Addendum)
We will change the gabapentin dosing taking the 300 mg capsules, 2 and morning, midday, and 4 in the evening.    Fall Prevention and Home Safety Falls cause injuries and can affect all age groups. It is possible to use preventive measures to significantly decrease the likelihood of falls. There are many simple measures which can make your home safer and prevent falls. OUTDOORS  Repair cracks and edges of walkways and driveways.  Remove high doorway thresholds.  Trim shrubbery on the main path into your home.  Have good outside lighting.  Clear walkways of tools, rocks, debris, and clutter.  Check that handrails are not broken and are securely fastened. Both sides of steps should have handrails.  Have leaves, snow, and ice cleared regularly.  Use sand or salt on walkways during winter months.  In the garage, clean up grease or oil spills. BATHROOM  Install night lights.  Install grab bars by the toilet and in the tub and shower.  Use non-skid mats or decals in the tub or shower.  Place a plastic non-slip stool in the shower to sit on, if needed.  Keep floors dry and clean up all water on the floor immediately.  Remove soap buildup in the tub or shower on a regular basis.  Secure bath mats with non-slip, double-sided rug tape.  Remove throw rugs and tripping hazards from the floors. BEDROOMS  Install night lights.  Make sure a bedside light is easy to reach.  Do not use oversized bedding.  Keep a telephone by your bedside.  Have a firm chair with side arms to use for getting dressed.  Remove throw rugs and tripping hazards from the floor. KITCHEN  Keep handles on pots and pans turned toward the center of the stove. Use back burners when possible.  Clean up spills quickly and allow time for drying.  Avoid walking on wet floors.  Avoid hot utensils and knives.  Position shelves so they are not too high or low.  Place commonly used objects within easy  reach.  If necessary, use a sturdy step stool with a grab bar when reaching.  Keep electrical cables out of the way.  Do not use floor polish or wax that makes floors slippery. If you must use wax, use non-skid floor wax.  Remove throw rugs and tripping hazards from the floor. STAIRWAYS  Never leave objects on stairs.  Place handrails on both sides of stairways and use them. Fix any loose handrails. Make sure handrails on both sides of the stairways are as long as the stairs.  Check carpeting to make sure it is firmly attached along stairs. Make repairs to worn or loose carpet promptly.  Avoid placing throw rugs at the top or bottom of stairways, or properly secure the rug with carpet tape to prevent slippage. Get rid of throw rugs, if possible.  Have an electrician put in a light switch at the top and bottom of the stairs. OTHER FALL PREVENTION TIPS  Wear low-heel or rubber-soled shoes that are supportive and fit well. Wear closed toe shoes.  When using a stepladder, make sure it is fully opened and both spreaders are firmly locked. Do not climb a closed stepladder.  Add color or contrast paint or tape to grab bars and handrails in your home. Place contrasting color strips on first and last steps.  Learn and use mobility aids as needed. Install an electrical emergency response system.  Turn on lights to avoid dark areas. Replace  light bulbs that burn out immediately. Get light switches that glow.  Arrange furniture to create clear pathways. Keep furniture in the same place.  Firmly attach carpet with non-skid or double-sided tape.  Eliminate uneven floor surfaces.  Select a carpet pattern that does not visually hide the edge of steps.  Be aware of all pets. OTHER HOME SAFETY TIPS  Set the water temperature for 120 F (48.8 C).  Keep emergency numbers on or near the telephone.  Keep smoke detectors on every level of the home and near sleeping areas. Document Released:  06/15/2002 Document Revised: 12/25/2011 Document Reviewed: 09/14/2011 [facility] Patient Information 2015 [place], [place]. This information is not intended to replace advice given to you by your health care provider. Make sure you discuss any questions you have with your health care provider.

## 2014-10-18 ENCOUNTER — Other Ambulatory Visit: Payer: Self-pay | Admitting: Nurse Practitioner

## 2014-10-26 ENCOUNTER — Non-Acute Institutional Stay: Payer: Medicare Other | Admitting: Internal Medicine

## 2014-10-26 ENCOUNTER — Encounter: Payer: Self-pay | Admitting: Internal Medicine

## 2014-10-26 VITALS — BP 130/66 | HR 75 | Temp 97.8°F | Wt 172.0 lb

## 2014-10-26 DIAGNOSIS — M25561 Pain in right knee: Secondary | ICD-10-CM

## 2014-10-26 DIAGNOSIS — M25562 Pain in left knee: Secondary | ICD-10-CM | POA: Diagnosis not present

## 2014-10-26 DIAGNOSIS — F329 Major depressive disorder, single episode, unspecified: Secondary | ICD-10-CM

## 2014-10-26 DIAGNOSIS — R531 Weakness: Secondary | ICD-10-CM | POA: Diagnosis not present

## 2014-10-26 DIAGNOSIS — M79669 Pain in unspecified lower leg: Secondary | ICD-10-CM

## 2014-10-26 DIAGNOSIS — F32A Depression, unspecified: Secondary | ICD-10-CM

## 2014-10-26 MED ORDER — HYDROCODONE-ACETAMINOPHEN 5-325 MG PO TABS: ORAL_TABLET | ORAL | Status: DC

## 2014-10-26 MED ORDER — GABAPENTIN 300 MG PO CAPS: ORAL_CAPSULE | ORAL | Status: AC

## 2014-10-26 NOTE — Progress Notes (Signed)
Patient ID: Justin Deleon, male   DOB: Aug 31, 1925, 81 y.o.   MRN: [id number]    [place] [facility] Service: Clinic (12)     No Known Allergies  Chief Complaint  Patient presents with  . Medication Management    since on Cymbalta can't focus, decrease in appetitie, chills. Took 6 Neurontin last night. Here with daughter Nyoka Cowden    HPI:  Started on Cymbalta 2 weeks ago. He did see Dr. Jannifer Franklin. Dr. Jannifer Franklin, appropriately, asking him to begin to spread out his gabapentin into the daytime hours rather than concentrate the entire dosage at bedtime. He is now taking 600 mg twice during daytime hours as well as up to 1800 mg at bedtime of the gabapentin. He also has increased his hydrocodone/APAP 5/325 2 up to 3 tablets daily.  Patient says his pains are under better control. He is fixated on the idea that the addition of the Cymbalta created the current situation of a drowsy unfocused feeling as well as his decrease in appetite.  An alternative explanation might be that movement of the gabapentin into the daytime hours has increased sedation during the day. It is also possible that the addition of the Cymbalta onto the other sedative drugs has created an additive drug reaction situation.  Medications: Patient's Medications  New Prescriptions   No medications on file  Previous Medications   DULOXETINE (CYMBALTA) 30 MG CAPSULE    One daily to help depression and pains   ERYTHROMYCIN OPHTHALMIC OINTMENT    Place 1 application into both eyes at bedtime.   GABAPENTIN (NEURONTIN) 300 MG CAPSULE    2 capsules in the morning and midday, take 4 capsules at night   HYDROCODONE-ACETAMINOPHEN (NORCO/VICODIN) 5-325 MG PER TABLET    Take 1 tablet prior to exercise to help pain   MULTIPLE VITAMINS-MINERALS (MULTIVITAMIN WITH MINERALS) TABLET    Take 1 tablet by mouth daily.    MULTIPLE VITAMINS-MINERALS (PRESERVISION AREDS) CAPS    Take 2 capsules by mouth daily. In morning   OMEPRAZOLE (PRILOSEC) 20 MG CAPSULE    Take 20 mg by mouth daily.   POLYETHYL GLYCOL-PROPYL GLYCOL (SYSTANE) 0.4-0.3 % SOLN    Apply to eye. One drop 4 times daily as needed for dry eyes   POLYETHYLENE GLYCOL (MIRALAX / GLYCOLAX) PACKET    Take 17 g by mouth daily.   RED YEAST RICE 600 MG TABS    Take 1 tablet by mouth daily. In morning   SENNA-DOCUSATE (SENNA PLUS) 8.6-50 MG PER TABLET    Take 1 tablet by mouth daily. In evening   TAMSULOSIN (FLOMAX) 0.4 MG CAPS CAPSULE    TAKE ONE CAPSULE BY MOUTH EVERY DAY.   VITAMIN D, CHOLECALCIFEROL, 1000 UNITS TABS    2 daily for vitamin D supplementation   ZOLPIDEM (AMBIEN) 10 MG TABLET    TAKE 1 TABLET BY MOUTH DAILY AT BEDTIME FOR SLEEP  Modified Medications   No medications on file  Discontinued Medications   No medications on file     Review of Systems  Constitutional: Negative for fever, chills, diaphoresis, activity change, appetite change, fatigue and unexpected weight change.  HENT: Positive for hearing loss and rhinorrhea.   Eyes: Negative.        Corrective lenses  Respiratory: Negative for apnea, cough, choking, chest tightness and shortness of breath.   Cardiovascular: Negative for chest pain, palpitations and leg swelling.  Gastrointestinal: Negative.   Endocrine: Negative.   Genitourinary:  Negative.   Musculoskeletal: Positive for back pain, arthralgias and gait problem.       Tender at both knees. No effusion or crepitance.  Skin: Negative for color change, pallor and wound.       AK on hands.  Skin cancers removed by Dr. Nevada Crane on the left postauricular area and the posterior neck.   Neurological: Negative.   Hematological: Bruises/bleeds easily.  Psychiatric/Behavioral: Positive for confusion and dysphoric mood. Negative for behavioral problems, sleep disturbance and agitation. The patient is not nervous/anxious.     Filed Vitals:   10/26/14 0932  BP: 130/66  Pulse: 75  Temp: 97.8 F (36.6 C)  TempSrc: Oral  Weight:  172 lb (78.019 kg)  SpO2: 98%   Body mass index is 24.68 kg/(m^2).  Physical Exam  Constitutional: He is oriented to person, place, and time. No distress.  Frail elderly male  HENT:  Nose: Nose normal.  Mouth/Throat: No oropharyngeal exudate.  Bilateral loss of hearing. Rhinorrhea.  Eyes:  Corrective lenses  Neck: Normal range of motion. Neck supple. No JVD present. No tracheal deviation present. No thyromegaly present.  Cardiovascular: Normal rate and regular rhythm.  Exam reveals no gallop and no friction rub.   Murmur heard. Grade 3/6 cooing ejection murmur  Pulmonary/Chest: No respiratory distress. He has no wheezes. He has no rales.  Abdominal: Soft. Bowel sounds are normal. He exhibits no distension and no mass. There is no tenderness.  Musculoskeletal: Normal range of motion. He exhibits tenderness (both knees). He exhibits no edema.  Lymphadenopathy:    He has no cervical adenopathy.  Neurological: He is alert and oriented to person, place, and time. No cranial nerve deficit. Coordination normal.  04/14/13 MMSE: 29/30. Failed clock drawing.  Skin: No rash (at neck) noted. No erythema. No pallor.  Actinic keratosis.  Skin cancers removed from left postauricular area and posterior neck.  Psychiatric: His behavior is normal. Thought content normal.  Some loss of memory. Poor historian. Mild depression.     Labs reviewed: Admission on 10/09/2014, Discharged on 10/09/2014  Component Date Value Ref Range Status  . Color, Urine 10/09/2014 YELLOW  YELLOW Final  . APPearance 10/09/2014 CLEAR  CLEAR Final  . Specific Gravity, Urine 10/09/2014 1.025  1.005 - 1.030 Final  . pH 10/09/2014 5.5  5.0 - 8.0 Final  . Glucose, UA 10/09/2014 NEGATIVE  NEGATIVE mg/dL Final  . Hgb urine dipstick 10/09/2014 NEGATIVE  NEGATIVE Final  . Bilirubin Urine 10/09/2014 NEGATIVE  NEGATIVE Final  . Ketones, ur 10/09/2014 NEGATIVE  NEGATIVE mg/dL Final  . Protein, ur 10/09/2014 NEGATIVE  NEGATIVE  mg/dL Final  . Urobilinogen, UA 10/09/2014 0.2  0.0 - 1.0 mg/dL Final  . Nitrite 10/09/2014 NEGATIVE  NEGATIVE Final  . Leukocytes, UA 10/09/2014 NEGATIVE  NEGATIVE Final   MICROSCOPIC NOT DONE ON URINES WITH NEGATIVE PROTEIN, BLOOD, LEUKOCYTES, NITRITE, OR GLUCOSE <1000 mg/dL.  Nursing Home on 08/10/2014  Component Date Value Ref Range Status  . Glucose 08/02/2014 78   Final  . BUN 08/02/2014 22* 4 - 21 mg/dL Final  . Creatinine 08/02/2014 1.4* 0.6 - 1.3 mg/dL Final  . Potassium 08/02/2014 4.7  3.4 - 5.3 mmol/L Final  . Sodium 08/02/2014 139  137 - 147 mmol/L Final  . LDl/HDL Ratio 08/02/2014 2.8   Final  . Triglycerides 08/02/2014 75  40 - 160 mg/dL Final  . Cholesterol 08/02/2014 179  0 - 200 mg/dL Final  . HDL 08/02/2014 65  35 - 70 mg/dL Final  .  LDL Cholesterol 08/02/2014 99   Final  . Alkaline Phosphatase 08/02/2014 48  25 - 125 U/L Final  . ALT 08/02/2014 13  10 - 40 U/L Final  . AST 08/02/2014 17  14 - 40 U/L Final  . Bilirubin, Total 08/02/2014 0.7   Final     Assessment/Plan  1. Pain in both knees - HYDROcodone-acetaminophen (NORCO/VICODIN) 5-325 MG per tablet; Take 1 tablet up to 4 times daily for knee pain  Dispense: 100 tablet; Refill: 0  2. Pain of lower leg, unspecified laterality - gabapentin (NEURONTIN) 300 MG capsule; Take 6 capsules at night to relieve leg pains  3. Generalized weakness I think his problems with sedation during the day may be related more to the movement of his Neurontin into the daytime hours as opposed to initiating therapy with Cymbalta. I have explained this to the patient and his daughter. He is still confused about what I'm saying. I have asked him to discontinue the daytime doses of Neurontin. He can remain on Neurontin 6 tablets nightly. Advised that if his pains are doing better he should also cut back on the hydrocodone/APAP. He will remain on 30 mg Cymbalta each morning.  4. Depression (emotion) Continue Cymbalta for dual effects of  antidepressants and pain reliever

## 2014-10-27 ENCOUNTER — Encounter (HOSPITAL_COMMUNITY): Payer: Self-pay | Admitting: *Deleted

## 2014-10-27 ENCOUNTER — Emergency Department (HOSPITAL_COMMUNITY): Payer: Medicare Other

## 2014-10-27 ENCOUNTER — Inpatient Hospital Stay (HOSPITAL_COMMUNITY)
Admission: EM | Admit: 2014-10-27 | Discharge: 2014-11-02 | DRG: 291 | Disposition: A | Discharge status: Hospice | Payer: Medicare Other | Attending: Internal Medicine | Admitting: Internal Medicine

## 2014-10-27 DIAGNOSIS — F419 Anxiety disorder, unspecified: Secondary | ICD-10-CM | POA: Diagnosis present

## 2014-10-27 DIAGNOSIS — Z8546 Personal history of malignant neoplasm of prostate: Secondary | ICD-10-CM | POA: Diagnosis not present

## 2014-10-27 DIAGNOSIS — I700 Atherosclerosis of aorta: Secondary | ICD-10-CM | POA: Diagnosis present

## 2014-10-27 DIAGNOSIS — I509 Heart failure, unspecified: Secondary | ICD-10-CM

## 2014-10-27 DIAGNOSIS — R0902 Hypoxemia: Secondary | ICD-10-CM | POA: Diagnosis not present

## 2014-10-27 DIAGNOSIS — Z515 Encounter for palliative care: Secondary | ICD-10-CM

## 2014-10-27 DIAGNOSIS — Z87442 Personal history of urinary calculi: Secondary | ICD-10-CM

## 2014-10-27 DIAGNOSIS — I350 Nonrheumatic aortic (valve) stenosis: Secondary | ICD-10-CM | POA: Diagnosis present

## 2014-10-27 DIAGNOSIS — R7989 Other specified abnormal findings of blood chemistry: Secondary | ICD-10-CM

## 2014-10-27 DIAGNOSIS — G609 Hereditary and idiopathic neuropathy, unspecified: Secondary | ICD-10-CM | POA: Diagnosis present

## 2014-10-27 DIAGNOSIS — R531 Weakness: Secondary | ICD-10-CM | POA: Diagnosis present

## 2014-10-27 DIAGNOSIS — F0390 Unspecified dementia without behavioral disturbance: Secondary | ICD-10-CM | POA: Diagnosis present

## 2014-10-27 DIAGNOSIS — Z9079 Acquired absence of other genital organ(s): Secondary | ICD-10-CM | POA: Diagnosis present

## 2014-10-27 DIAGNOSIS — I10 Essential (primary) hypertension: Secondary | ICD-10-CM | POA: Diagnosis present

## 2014-10-27 DIAGNOSIS — Z79899 Other long term (current) drug therapy: Secondary | ICD-10-CM | POA: Diagnosis not present

## 2014-10-27 DIAGNOSIS — Z789 Other specified health status: Secondary | ICD-10-CM | POA: Diagnosis not present

## 2014-10-27 DIAGNOSIS — Z8249 Family history of ischemic heart disease and other diseases of the circulatory system: Secondary | ICD-10-CM | POA: Diagnosis not present

## 2014-10-27 DIAGNOSIS — I5043 Acute on chronic combined systolic (congestive) and diastolic (congestive) heart failure: Secondary | ICD-10-CM | POA: Diagnosis not present

## 2014-10-27 DIAGNOSIS — I5021 Acute systolic (congestive) heart failure: Principal | ICD-10-CM | POA: Diagnosis present

## 2014-10-27 DIAGNOSIS — K921 Melena: Secondary | ICD-10-CM | POA: Insufficient documentation

## 2014-10-27 DIAGNOSIS — D62 Acute posthemorrhagic anemia: Secondary | ICD-10-CM | POA: Diagnosis not present

## 2014-10-27 DIAGNOSIS — J189 Pneumonia, unspecified organism: Secondary | ICD-10-CM | POA: Diagnosis present

## 2014-10-27 DIAGNOSIS — Z66 Do not resuscitate: Secondary | ICD-10-CM | POA: Diagnosis present

## 2014-10-27 DIAGNOSIS — R339 Retention of urine, unspecified: Secondary | ICD-10-CM | POA: Diagnosis present

## 2014-10-27 DIAGNOSIS — D494 Neoplasm of unspecified behavior of bladder: Secondary | ICD-10-CM | POA: Diagnosis not present

## 2014-10-27 DIAGNOSIS — R778 Other specified abnormalities of plasma proteins: Secondary | ICD-10-CM | POA: Diagnosis present

## 2014-10-27 DIAGNOSIS — K5791 Diverticulosis of intestine, part unspecified, without perforation or abscess with bleeding: Secondary | ICD-10-CM | POA: Diagnosis present

## 2014-10-27 DIAGNOSIS — Z87891 Personal history of nicotine dependence: Secondary | ICD-10-CM | POA: Diagnosis not present

## 2014-10-27 DIAGNOSIS — J9601 Acute respiratory failure with hypoxia: Secondary | ICD-10-CM | POA: Diagnosis present

## 2014-10-27 DIAGNOSIS — D649 Anemia, unspecified: Secondary | ICD-10-CM

## 2014-10-27 DIAGNOSIS — I5033 Acute on chronic diastolic (congestive) heart failure: Secondary | ICD-10-CM | POA: Diagnosis not present

## 2014-10-27 DIAGNOSIS — R935 Abnormal findings on diagnostic imaging of other abdominal regions, including retroperitoneum: Secondary | ICD-10-CM | POA: Diagnosis present

## 2014-10-27 DIAGNOSIS — M792 Neuralgia and neuritis, unspecified: Secondary | ICD-10-CM | POA: Diagnosis not present

## 2014-10-27 DIAGNOSIS — R109 Unspecified abdominal pain: Secondary | ICD-10-CM

## 2014-10-27 HISTORY — DX: Hypoxemia: R09.02

## 2014-10-27 LAB — COMPREHENSIVE METABOLIC PANEL
ALK PHOS: 63 U/L (ref 39–117)
ALT: 13 U/L (ref 0–53)
AST: 19 U/L (ref 0–37)
Albumin: 3.8 g/dL (ref 3.5–5.2)
Anion gap: 8 (ref 5–15)
BILIRUBIN TOTAL: 0.8 mg/dL (ref 0.3–1.2)
BUN: 17 mg/dL (ref 6–23)
CHLORIDE: 99 mmol/L (ref 96–112)
CO2: 26 mmol/L (ref 19–32)
Calcium: 9.1 mg/dL (ref 8.4–10.5)
Creatinine, Ser: 1.25 mg/dL (ref 0.50–1.35)
GFR calc Af Amer: 58 mL/min — ABNORMAL LOW (ref >90)
GFR calc non Af Amer: 50 mL/min — ABNORMAL LOW (ref >90)
Glucose, Bld: 110 mg/dL — ABNORMAL HIGH (ref 70–99)
Potassium: 4.1 mmol/L (ref 3.5–5.1)
Sodium: 133 mmol/L — ABNORMAL LOW (ref 135–145)
TOTAL PROTEIN: 6.5 g/dL (ref 6.0–8.3)

## 2014-10-27 LAB — CBC WITH DIFFERENTIAL/PLATELET
BASOS PCT: 0 % (ref 0–1)
Basophils Absolute: 0 10*3/uL (ref 0.0–0.1)
Eosinophils Absolute: 0.1 10*3/uL (ref 0.0–0.7)
Eosinophils Relative: 1 % (ref 0–5)
HEMATOCRIT: 33.4 % — AB (ref 39.0–52.0)
HEMOGLOBIN: 10.5 g/dL — AB (ref 13.0–17.0)
LYMPHS ABS: 1.2 10*3/uL (ref 0.7–4.0)
Lymphocytes Relative: 12 % (ref 12–46)
MCH: 27.3 pg (ref 26.0–34.0)
MCHC: 31.4 g/dL (ref 30.0–36.0)
MCV: 86.8 fL (ref 78.0–100.0)
MONO ABS: 1.2 10*3/uL — AB (ref 0.1–1.0)
MONOS PCT: 12 % (ref 3–12)
NEUTROS ABS: 7.3 10*3/uL (ref 1.7–7.7)
NEUTROS PCT: 75 % (ref 43–77)
Platelets: 421 10*3/uL — ABNORMAL HIGH (ref 150–400)
RBC: 3.85 MIL/uL — AB (ref 4.22–5.81)
RDW: 18.9 % — ABNORMAL HIGH (ref 11.5–15.5)
WBC: 9.7 10*3/uL (ref 4.0–10.5)

## 2014-10-27 LAB — URINALYSIS, ROUTINE W REFLEX MICROSCOPIC
Bilirubin Urine: NEGATIVE
Glucose, UA: NEGATIVE mg/dL
Ketones, ur: NEGATIVE mg/dL
Nitrite: NEGATIVE
Protein, ur: NEGATIVE mg/dL
SPECIFIC GRAVITY, URINE: 1.017 (ref 1.005–1.030)
Urobilinogen, UA: 0.2 mg/dL (ref 0.0–1.0)
pH: 6 (ref 5.0–8.0)

## 2014-10-27 LAB — TROPONIN I: Troponin I: 0.15 ng/mL — ABNORMAL HIGH (ref <0.031)

## 2014-10-27 LAB — URINE MICROSCOPIC-ADD ON

## 2014-10-27 LAB — I-STAT TROPONIN, ED: Troponin i, poc: 0.04 ng/mL (ref 0.00–0.08)

## 2014-10-27 LAB — LIPASE, BLOOD: Lipase: 26 U/L (ref 11–59)

## 2014-10-27 LAB — BRAIN NATRIURETIC PEPTIDE: B NATRIURETIC PEPTIDE 5: 859.9 pg/mL — AB (ref 0.0–100.0)

## 2014-10-27 MED ORDER — ONDANSETRON HCL 4 MG/2ML IJ SOLN: 4.0000 mg | Freq: Four times a day (QID) | INTRAMUSCULAR | Status: DC | PRN

## 2014-10-27 MED ORDER — IOHEXOL 300 MG/ML  SOLN
25.0000 mL | Freq: Once | INTRAMUSCULAR | Status: AC | PRN
  Administered 2014-10-27: 25 mL via ORAL

## 2014-10-27 MED ORDER — DEXTROSE 5 % IV SOLN
500.0000 mg | INTRAVENOUS | Status: DC
  Administered 2014-10-27 – 2014-10-29 (×3): 500 mg via INTRAVENOUS
  Filled 2014-10-27 (×3): qty 500

## 2014-10-27 MED ORDER — ACETAMINOPHEN 325 MG PO TABS
650.0000 mg | ORAL_TABLET | ORAL | Status: DC | PRN
  Administered 2014-10-28: 650 mg via ORAL
  Filled 2014-10-27: qty 2

## 2014-10-27 MED ORDER — CEFTRIAXONE SODIUM IN DEXTROSE 20 MG/ML IV SOLN
1.0000 g | INTRAVENOUS | Status: DC
  Administered 2014-10-28 – 2014-10-29 (×3): 1 g via INTRAVENOUS
  Filled 2014-10-27 (×3): qty 50

## 2014-10-27 MED ORDER — SODIUM CHLORIDE 0.9 % IV SOLN: 250.0000 mL | INTRAVENOUS | Status: DC | PRN

## 2014-10-27 MED ORDER — IOHEXOL 300 MG/ML  SOLN
100.0000 mL | Freq: Once | INTRAMUSCULAR | Status: AC | PRN
  Administered 2014-10-27: 100 mL via INTRAVENOUS

## 2014-10-27 MED ORDER — ZOLPIDEM TARTRATE 5 MG PO TABS
5.0000 mg | ORAL_TABLET | Freq: Every evening | ORAL | Status: DC | PRN
  Administered 2014-10-27 – 2014-10-31 (×4): 5 mg via ORAL
  Filled 2014-10-27 (×5): qty 1

## 2014-10-27 MED ORDER — SODIUM CHLORIDE 0.9 % IJ SOLN
3.0000 mL | INTRAMUSCULAR | Status: DC | PRN
  Administered 2014-10-30: 3 mL via INTRAVENOUS
  Filled 2014-10-27: qty 3

## 2014-10-27 MED ORDER — POLYETHYLENE GLYCOL 3350 17 G PO PACK
17.0000 g | PACK | Freq: Every day | ORAL | Status: DC
  Administered 2014-10-28 – 2014-11-01 (×2): 17 g via ORAL
  Filled 2014-10-27 (×4): qty 1

## 2014-10-27 MED ORDER — FENTANYL CITRATE (PF) 100 MCG/2ML IJ SOLN
50.0000 ug | Freq: Once | INTRAMUSCULAR | Status: AC
  Administered 2014-10-27: 50 ug via INTRAVENOUS
  Filled 2014-10-27: qty 2

## 2014-10-27 MED ORDER — SODIUM CHLORIDE 0.9 % IJ SOLN
3.0000 mL | Freq: Two times a day (BID) | INTRAMUSCULAR | Status: DC
  Administered 2014-10-27 – 2014-11-02 (×6): 3 mL via INTRAVENOUS

## 2014-10-27 MED ORDER — PANTOPRAZOLE SODIUM 40 MG PO TBEC
40.0000 mg | DELAYED_RELEASE_TABLET | Freq: Every day | ORAL | Status: DC
  Administered 2014-10-28 – 2014-11-01 (×5): 40 mg via ORAL
  Filled 2014-10-27 (×5): qty 1

## 2014-10-27 MED ORDER — SENNOSIDES-DOCUSATE SODIUM 8.6-50 MG PO TABS
3.0000 | ORAL_TABLET | Freq: Every day | ORAL | Status: DC
  Administered 2014-10-27 – 2014-10-31 (×3): 3 via ORAL
  Filled 2014-10-27 (×3): qty 3

## 2014-10-27 MED ORDER — SODIUM CHLORIDE 0.9 % IJ SOLN: 3.0000 mL | INTRAMUSCULAR | Status: DC | PRN

## 2014-10-27 MED ORDER — ERYTHROMYCIN 5 MG/GM OP OINT
1.0000 "application " | TOPICAL_OINTMENT | Freq: Every day | OPHTHALMIC | Status: DC
  Administered 2014-10-29 – 2014-11-01 (×3): 1 via OPHTHALMIC
  Filled 2014-10-27 (×2): qty 3.5

## 2014-10-27 MED ORDER — FUROSEMIDE 10 MG/ML IJ SOLN
40.0000 mg | INTRAMUSCULAR | Status: AC
  Administered 2014-10-27: 40 mg via INTRAVENOUS
  Filled 2014-10-27: qty 4

## 2014-10-27 MED ORDER — DOCUSATE SODIUM 100 MG PO CAPS
300.0000 mg | ORAL_CAPSULE | Freq: Every day | ORAL | Status: DC
  Administered 2014-10-28 – 2014-11-01 (×4): 300 mg via ORAL
  Filled 2014-10-27 (×5): qty 3

## 2014-10-27 MED ORDER — TAMSULOSIN HCL 0.4 MG PO CAPS
0.4000 mg | ORAL_CAPSULE | Freq: Every day | ORAL | Status: DC
  Administered 2014-10-28 – 2014-11-02 (×6): 0.4 mg via ORAL
  Filled 2014-10-27 (×6): qty 1

## 2014-10-27 MED ORDER — GABAPENTIN 300 MG PO CAPS
1800.0000 mg | ORAL_CAPSULE | Freq: Every day | ORAL | Status: DC
  Administered 2014-10-27 – 2014-11-01 (×6): 1800 mg via ORAL
  Filled 2014-10-27 (×6): qty 6

## 2014-10-27 MED ORDER — FUROSEMIDE 40 MG PO TABS
40.0000 mg | ORAL_TABLET | Freq: Two times a day (BID) | ORAL | Status: DC
  Filled 2014-10-27: qty 1

## 2014-10-27 MED ORDER — ENOXAPARIN SODIUM 40 MG/0.4ML ~~LOC~~ SOLN
40.0000 mg | SUBCUTANEOUS | Status: DC
  Administered 2014-10-27 – 2014-10-28 (×3): 40 mg via SUBCUTANEOUS
  Filled 2014-10-27 (×2): qty 0.4

## 2014-10-27 MED ORDER — SODIUM CHLORIDE 0.9 % IJ SOLN
3.0000 mL | Freq: Two times a day (BID) | INTRAMUSCULAR | Status: DC
  Administered 2014-10-27 – 2014-10-31 (×2): 3 mL via INTRAVENOUS

## 2014-10-27 MED ORDER — DULOXETINE HCL 30 MG PO CPEP
30.0000 mg | ORAL_CAPSULE | Freq: Every day | ORAL | Status: DC
  Administered 2014-10-28 – 2014-11-02 (×6): 30 mg via ORAL
  Filled 2014-10-27 (×6): qty 1

## 2014-10-27 NOTE — ED Provider Notes (Signed)
CSN: [id number]     Arrival date & time 10/27/14  1520 History   First MD Initiated Contact with Patient 10/27/14 1529     Chief Complaint  Patient presents with  . Abdominal Pain     (Consider location/radiation/quality/duration/timing/severity/associated sxs/prior Treatment) HPI Comments: Patient with past medical history of hypertension, anemia, aortic stenosis, and prostate cancer presents emergency department with chief complaint of 3 weeks of generalized fatigue and weakness. Patient states that he has not had the energy to do his normal activities. States that his arm is very active, exercises almost daily, but has not had the strength over the past 3 weeks. States that today he began to notice upper abdominal pain. He has not had any pain prior to this. He denies any chest pain, shortness of breath, cough, nausea, vomiting, diarrhea, or constipation. Denies any fevers, chills, or dysuria. There are no aggravating or alleviating factors. Patient also complains of chronic knee pain. Denies any mechanism of injury regarding the knees.  The history is provided by the patient. No language interpreter was used.    Past Medical History  Diagnosis Date  . Depressed   . Hernia   . Kidney stones   . Hypertension   . Prostate cancer   . Cataract 2012    Mohs MD  . Hypertrophy of prostate with urinary obstruction and other lower urinary tract symptoms (LUTS) 08/26/2012  . Edema 08/26/2012  . Ectropion, unspecified 05/27/2012  . Abnormality of gait 11/27/2011  . Unspecified hereditary and idiopathic peripheral neuropathy 08/28/2011  . Basal cell carcinoma of scalp and skin of neck 06/26/2011  . Pain in limb 05/28/2011  . Closed fracture of midcervical section of femur 05/28/2011  . Other and unspecified hyperlipidemia 04/06/2011  . Anemia, unspecified 04/06/2011  . Peripheral vascular disease, unspecified 04/06/2011  . Reflux esophagitis 04/06/2011  . Unspecified constipation 04/06/2011  .  Insomnia, unspecified 04/06/2011  . Undiagnosed cardiac murmurs 04/06/2011  . Hypertrophy of prostate with urinary obstruction and other lower urinary tract symptoms (LUTS) 2013  . Depression, acute   . Aortic valve stenosis, rheumatic   . Kidney stones    Past Surgical History  Procedure Laterality Date  . Cholecystectomy  1963/1978  . Hernia repair  6269    umbilical  . Spine surgery  2010    lumbar-L3-4/L4-5  . Fracture surgery Left 03/2011    hip  . Eye surgery      retina repair right  . Esophagogastroduodenoscopy N/A 04/17/2013    Procedure: ESOPHAGOGASTRODUODENOSCOPY (EGD);  Surgeon: Wonda Horner, MD;  Location: [facility] ENDOSCOPY;  Service: Endoscopy;  Laterality: N/A;  . Colonoscopy N/A 04/21/2013    Procedure: COLONOSCOPY;  Surgeon: Jeryl Columbia, MD;  Location: [facility] ENDOSCOPY;  Service: Endoscopy;  Laterality: N/A;  . Skin cancer excision  11/10/13    2 lesions removed back of neck Dr. Nevada Crane  . Skin cancer excision  03/23/14    below left ear   Family History  Problem Relation Age of Onset  . Heart disease Father     MI  . Cancer Sister     bladder   History  Substance Use Topics  . Smoking status: Former Smoker    Types: Cigars    Quit date: 12/10/1987  . Smokeless tobacco: Never Used  . Alcohol Use: 0.6 oz/week    1 Cans of beer per week     Comment: 1 week    Review of Systems  Constitutional: Positive for fatigue. Negative for  fever and chills.  Respiratory: Negative for shortness of breath.   Cardiovascular: Negative for chest pain.  Gastrointestinal: Positive for abdominal pain. Negative for nausea, vomiting, diarrhea and constipation.  Genitourinary: Negative for dysuria.  All other systems reviewed and are negative.     Allergies  Review of patient's allergies indicates no known allergies.  Home Medications   Prior to Admission medications   Medication Sig Start Date End Date Taking? Authorizing Provider  DULoxetine (CYMBALTA) 30 MG capsule One daily  to help depression and pains Patient taking differently: Take 30 mg by mouth daily.  10/12/14   Estill Dooms, MD  erythromycin ophthalmic ointment Place 1 application into both eyes at bedtime.    Historical Provider, MD  gabapentin (NEURONTIN) 300 MG capsule Take 6 capsules at night to relieve leg pains 10/26/14   Estill Dooms, MD  HYDROcodone-acetaminophen (NORCO/VICODIN) 5-325 MG per tablet Take 1 tablet up to 4 times daily for knee pain 10/26/14   Estill Dooms, MD  Multiple Vitamins-Minerals (MULTIVITAMIN WITH MINERALS) tablet Take 1 tablet by mouth daily.     Historical Provider, MD  Multiple Vitamins-Minerals (PRESERVISION AREDS) CAPS Take 2 capsules by mouth daily. In morning    Historical Provider, MD  omeprazole (PRILOSEC) 20 MG capsule Take 20 mg by mouth daily.    Historical Provider, MD  Polyethyl Glycol-Propyl Glycol (SYSTANE) 0.4-0.3 % SOLN Apply to eye. One drop 4 times daily as needed for dry eyes    Historical Provider, MD  polyethylene glycol (MIRALAX / GLYCOLAX) packet Take 17 g by mouth daily.    Historical Provider, MD  Red Yeast Rice 600 MG TABS Take 1 tablet by mouth daily. In morning    Historical Provider, MD  senna-docusate (SENNA PLUS) 8.6-50 MG per tablet Take 1 tablet by mouth daily. In evening    Historical Provider, MD  tamsulosin (FLOMAX) 0.4 MG CAPS capsule TAKE ONE CAPSULE BY MOUTH EVERY DAY. 07/19/14   Estill Dooms, MD  Vitamin D, Cholecalciferol, 1000 UNITS TABS 2 daily for vitamin D supplementation Patient taking differently: Take 1,000 mg by mouth daily.  06/09/13   Estill Dooms, MD  zolpidem (AMBIEN) 10 MG tablet TAKE 1 TABLET BY MOUTH DAILY AT BEDTIME FOR SLEEP 10/18/14   Tiffany L Reed, DO   BP 128/80 mmHg  Pulse 97  Temp(Src) 98.6 F (37 C) (Oral)  Resp 18  SpO2 88% Physical Exam  Constitutional: He is oriented to person, place, and time. He appears well-developed and well-nourished.  HENT:  Head: Normocephalic and atraumatic.  Eyes:  Conjunctivae and EOM are normal. Pupils are equal, round, and reactive to light. Right eye exhibits no discharge. Left eye exhibits no discharge. No scleral icterus.  Neck: Normal range of motion. Neck supple. No JVD present.  Cardiovascular: Normal rate, regular rhythm and normal heart sounds.  Exam reveals no gallop and no friction rub.   No murmur heard. Pulmonary/Chest: Effort normal. No respiratory distress. He has wheezes. He has no rales. He exhibits no tenderness.  Mild right-sided wheezes  Abdominal: Soft. He exhibits no distension and no mass. There is no tenderness. There is no rebound and no guarding.  Musculoskeletal: Normal range of motion. He exhibits no edema or tenderness.  Neurological: He is alert and oriented to person, place, and time.  Skin: Skin is warm and dry.  Psychiatric: He has a normal mood and affect. His behavior is normal. Judgment and thought content normal.  Nursing note and vitals reviewed.  ED Course  Procedures (including critical care time) Results for orders placed or performed during the hospital encounter of 10/27/14  CBC with Differential  Result Value Ref Range   WBC 9.7 4.0 - 10.5 K/uL   RBC 3.85 (L) 4.22 - 5.81 MIL/uL   Hemoglobin 10.5 (L) 13.0 - 17.0 g/dL   HCT 33.4 (L) 39.0 - 52.0 %   MCV 86.8 78.0 - 100.0 fL   MCH 27.3 26.0 - 34.0 pg   MCHC 31.4 30.0 - 36.0 g/dL   RDW 18.9 (H) 11.5 - 15.5 %   Platelets 421 (H) 150 - 400 K/uL   Neutrophils Relative % 75 43 - 77 %   Neutro Abs 7.3 1.7 - 7.7 K/uL   Lymphocytes Relative 12 12 - 46 %   Lymphs Abs 1.2 0.7 - 4.0 K/uL   Monocytes Relative 12 3 - 12 %   Monocytes Absolute 1.2 (H) 0.1 - 1.0 K/uL   Eosinophils Relative 1 0 - 5 %   Eosinophils Absolute 0.1 0.0 - 0.7 K/uL   Basophils Relative 0 0 - 1 %   Basophils Absolute 0.0 0.0 - 0.1 K/uL  Comprehensive metabolic panel  Result Value Ref Range   Sodium 133 (L) 135 - 145 mmol/L   Potassium 4.1 3.5 - 5.1 mmol/L   Chloride 99 96 - 112  mmol/L   CO2 26 19 - 32 mmol/L   Glucose, Bld 110 (H) 70 - 99 mg/dL   BUN 17 6 - 23 mg/dL   Creatinine, Ser 1.25 0.50 - 1.35 mg/dL   Calcium 9.1 8.4 - 10.5 mg/dL   Total Protein 6.5 6.0 - 8.3 g/dL   Albumin 3.8 3.5 - 5.2 g/dL   AST 19 0 - 37 U/L   ALT 13 0 - 53 U/L   Alkaline Phosphatase 63 39 - 117 U/L   Total Bilirubin 0.8 0.3 - 1.2 mg/dL   GFR calc non Af Amer 50 (L) >90 mL/min   GFR calc Af Amer 58 (L) >90 mL/min   Anion gap 8 5 - 15  Lipase, blood  Result Value Ref Range   Lipase 26 11 - 59 U/L  Urinalysis, Routine w reflex microscopic  Result Value Ref Range   Color, Urine YELLOW YELLOW   APPearance CLOUDY (A) CLEAR   Specific Gravity, Urine 1.017 1.005 - 1.030   pH 6.0 5.0 - 8.0   Glucose, UA NEGATIVE NEGATIVE mg/dL   Hgb urine dipstick MODERATE (A) NEGATIVE   Bilirubin Urine NEGATIVE NEGATIVE   Ketones, ur NEGATIVE NEGATIVE mg/dL   Protein, ur NEGATIVE NEGATIVE mg/dL   Urobilinogen, UA 0.2 0.0 - 1.0 mg/dL   Nitrite NEGATIVE NEGATIVE   Leukocytes, UA SMALL (A) NEGATIVE  Brain natriuretic peptide  Result Value Ref Range   B Natriuretic Peptide 859.9 (H) 0.0 - 100.0 pg/mL  Urine microscopic-add on  Result Value Ref Range   Squamous Epithelial / LPF RARE RARE   WBC, UA 0-2 <3 WBC/hpf   RBC / HPF TOO NUMEROUS TO COUNT <3 RBC/hpf  I-Stat Troponin, ED (not at [facility])  Result Value Ref Range   Troponin i, poc 0.04 0.00 - 0.08 ng/mL   Comment 3           Dg Chest 2 View  10/27/2014   CLINICAL DATA:  Chest pain and short of breath  EXAM: CHEST  2 VIEW  COMPARISON:  03/19/2012.  FINDINGS: Bilateral airspace disease, perihilar predominance. Small bilateral pleural effusions with bibasilar atelectasis. Cardiac  enlargement. Previously the lungs are clear.  IMPRESSION: Congestive heart failure with edema and small effusions.   Electronically Signed   By: Franchot Gallo M.D.   On: 10/27/2014 16:30   Ct Abdomen Pelvis W Contrast  10/27/2014   CLINICAL DATA:  Lower abdominal pain  for 3 weeks  EXAM: CT ABDOMEN AND PELVIS WITH CONTRAST  TECHNIQUE: Multidetector CT imaging of the abdomen and pelvis was performed using the standard protocol following bolus administration of intravenous contrast.  CONTRAST:  75mL OMNIPAQUE IOHEXOL 300 MG/ML SOLN, 184mL OMNIPAQUE IOHEXOL 300 MG/ML SOLN  COMPARISON:  None.  FINDINGS: Bilateral pleural effusions are noted with bibasilar infiltrate.  The gallbladder has been surgically removed. The liver, spleen and pancreas are within normal limits. The adrenal glands are enlarged bilaterally likely related hypertrophy. No definitive mass lesion is seen.  The kidneys are well visualized bilaterally and demonstrate bilateral renal cystic change. This is much worse on the left than the right with at least 2 dominant cysts. These measure 8.1 cm and 10.8 cm. Delayed images demonstrate normal excretion of contrast material. Aortoiliac calcifications are seen.  Small anterior abdominal wall hernia is noted adjacent to the umbilicus containing fat. No herniation of bowel loops within is noted. The prostate shows multiple brachytherapy seeds. The bladder is partially distended. A irregular enhancing lesion is noted arising from the inferior aspect of the urinary bladder. This is consistent with a bladder neoplasm until proven otherwise. No significant pelvic lymphadenopathy is identified.  The bony structures show changes of prior left hip pinning. Degenerative changes of the lumbar spine are seen.  IMPRESSION: Changes suggestive of bladder mass. Direct visualization is recommended.  Bilateral lower lobe infiltrates with associated effusions.  Bilateral renal cystic change.  Fat containing periumbilical hernia.   Electronically Signed   By: Inez Catalina M.D.   On: 10/27/2014 17:49      EKG Interpretation   Date/Time:  Wednesday October 27 2014 15:35:19 EDT Ventricular Rate:  95 PR Interval:  209 QRS Duration: 96 QT Interval:  370 QTC Calculation: 465 R Axis:    -36 Text Interpretation:  Sinus rhythm Ventricular premature complex  Borderline prolonged PR interval Probable left atrial enlargement LVH with  secondary repolarization abnormality Inferior infarct, old Anterior Q  waves, possibly due to LVH no significant change since Oct 2014 Confirmed  by Regenia Skeeter  MD, [place] [phone]) on 10/27/2014 6:28:25 PM      MDM   Final diagnoses:  Abdominal pain  Congestive heart failure, unspecified congestive heart failure chronicity, unspecified congestive heart failure type  Bladder neoplasm  Hypoxia    Patient with generalized fatigue, weakness, and upper abdominal pain. Will check chest x-ray, troponin, EKG, basic labs. Consider CT scan. Additionally, patient is noted to be hypoxic in the ED, with O2 saturation dropping to 88%.   5:55 PM Patient seen by and discussed with Dr. Regenia Skeeter, who agrees with plan for admission.  Patient will need admission for hypoxia and CHF.  Neoplasm discussed with family by Dr. Regenia Skeeter.  Patient has hx of prostate cancer and has Dr. Hartley Barefoot for urologist.  No ischemic changes on EKG, troponin is negative, BNP is 859. Chest x-ray remarkable for congestive heart failure with edema and small effusions. As patient is hypoxic, he will require admission.  Appreciate Dr. Roel Cluck for admission.  Montine Circle, PA-C 10/27/14 1834  Sherwood Gambler, MD 10/30/14 [phone]

## 2014-10-27 NOTE — H&P (Signed)
PCP: Estill Dooms, MD  neurology [facility] Daneen Schick Urology Dr. Minus Liberty GI doctor [facility]  Referring physician: Lorre Munroe  Chief Complaint: Generalized weakness  HPI: Justin Deleon is a 81 y.o. male   has a past medical history of Depressed; Hernia; Kidney stones; Hypertension; Prostate cancer; Cataract (2012); Hypertrophy of prostate with urinary obstruction and other lower urinary tract symptoms (LUTS) (08/26/2012); Edema (08/26/2012); Ectropion, unspecified (05/27/2012); Abnormality of gait (11/27/2011); Unspecified hereditary and idiopathic peripheral neuropathy (08/28/2011); Basal cell carcinoma of scalp and skin of neck (06/26/2011); Pain in limb (05/28/2011); Closed fracture of midcervical section of femur (05/28/2011); Other and unspecified hyperlipidemia (04/06/2011); Anemia, unspecified (04/06/2011); Peripheral vascular disease, unspecified (04/06/2011); Reflux esophagitis (04/06/2011); Unspecified constipation (04/06/2011); Insomnia, unspecified (04/06/2011); Undiagnosed cardiac murmurs (04/06/2011); Hypertrophy of prostate with urinary obstruction and other lower urinary tract symptoms (LUTS) (2013); Depression, acute; Aortic valve stenosis, rheumatic; and Kidney stones.   Presented with generalized fatigue for the past couple weeks. Which she attributed to taking his Cymbalta. Patient had been endorsing some decrease in appetite. HE has had chills, no fever no cough. He denies any shortness of breath. Patient was seen yesterday at his primary care office and his Neurontin was switched from day tonight to alleviate his fatigue. Today patient presented to emergency department due to 8 out of 10 abdominal pain. Patient has history of prostate cancer status post prostatectomy and seeds implantation in the past for by Dr. Minus Liberty. Patient was found to be hypoxic on room air down to 88%. She required 4 L of oxygen to correct this. Plain chest x-ray showed evidence of CHF.  Last echogram in 2014 showing preserved EF is probably severe stenosis and mild regurgitation with a valve area of 0.63 cm with evidence of pulmonary hypertension with peak pressures up to 71 mmHg. His been followed by cardiology Dr. Daneen Schick in the past but seems like he haven't seen them for over a year. Given abdominal pain CT scan of abdomen was done showing changes suggestive of bladder mass. CT also showed bibasal infiltrates with associated effusions. Since he has been in ER he has trouble producing urine despite being given lasix for presumed CHF. Eventually patient did urinate and was incontinent of urine. Patient appears to have some trouble with straining in order to urinate. Foley has been ordered given the patient is being diuresed for presumed CHF and may show signs of some urinary retention.    Hospitalist was called for admission for bilateral pneumonia versus CHF exacerbation a new diagnosis of bladder mass   Review of Systems:    Pertinent positives include: fatigue, chills, abdominal pain,  Frequency in urination.  Constitutional:  No weight loss, night sweats, Fevers, weight loss  HEENT:  No headaches, Difficulty swallowing,Tooth/dental problems,Sore throat,  No sneezing, itching, ear ache, nasal congestion, post nasal drip,  Cardio-vascular:  No chest pain, Orthopnea, PND, anasarca, dizziness, palpitations.no Bilateral lower extremity swelling  GI:  No heartburn, indigestion, nausea, vomiting, diarrhea, change in bowel habits, loss of appetite, melena, blood in stool, hematemesis Resp:  no shortness of breath at rest. No dyspnea on exertion, No excess mucus, no productive cough, No non-productive cough, No coughing up of blood.No change in color of mucus.No wheezing. Skin:  no rash or lesions. No jaundice GU:  no dysuria, change in color of urine, no urgency or No straining to urinate.  No flank pain.  Musculoskeletal:  No joint pain or no joint swelling. No  decreased range of motion.  No back pain.  Psych:  No change in mood or affect. No depression or anxiety. No memory loss.  Neuro: no localizing neurological complaints, no tingling, no weakness, no double vision, no gait abnormality, no slurred speech, no confusion  Otherwise ROS are negative except for above, 10 systems were reviewed  Past Medical History: Past Medical History  Diagnosis Date  . Depressed   . Hernia   . Kidney stones   . Hypertension   . Prostate cancer   . Cataract 2012    Mohs MD  . Hypertrophy of prostate with urinary obstruction and other lower urinary tract symptoms (LUTS) 08/26/2012  . Edema 08/26/2012  . Ectropion, unspecified 05/27/2012  . Abnormality of gait 11/27/2011  . Unspecified hereditary and idiopathic peripheral neuropathy 08/28/2011  . Basal cell carcinoma of scalp and skin of neck 06/26/2011  . Pain in limb 05/28/2011  . Closed fracture of midcervical section of femur 05/28/2011  . Other and unspecified hyperlipidemia 04/06/2011  . Anemia, unspecified 04/06/2011  . Peripheral vascular disease, unspecified 04/06/2011  . Reflux esophagitis 04/06/2011  . Unspecified constipation 04/06/2011  . Insomnia, unspecified 04/06/2011  . Undiagnosed cardiac murmurs 04/06/2011  . Hypertrophy of prostate with urinary obstruction and other lower urinary tract symptoms (LUTS) 2013  . Depression, acute   . Aortic valve stenosis, rheumatic   . Kidney stones    Past Surgical History  Procedure Laterality Date  . Cholecystectomy  1963/1978  . Hernia repair  8242    umbilical  . Spine surgery  2010    lumbar-L3-4/L4-5  . Fracture surgery Left 03/2011    hip  . Eye surgery      retina repair right  . Esophagogastroduodenoscopy N/A 04/17/2013    Procedure: ESOPHAGOGASTRODUODENOSCOPY (EGD);  Surgeon: Wonda Horner, MD;  Location[facility] ENDOSCOPY;  Service: Endoscopy;  Laterality: N/A;  . Colonoscopy N/A 04/21/2013    Procedure: COLONOSCOPY;  Surgeon: Jeryl Columbia, MD;   Location: [facility] ENDOSCOPY;  Service: Endoscopy;  Laterality: N/A;  . Skin cancer excision  11/10/13    2 lesions removed back of neck Dr. Nevada Crane  . Skin cancer excision  03/23/14    below left ear     Medications: Prior to Admission medications   Medication Sig Start Date End Date Taking? Authorizing Provider  docusate sodium (COLACE) 100 MG capsule Take 300 mg by mouth daily.   Yes Historical Provider, MD  DULoxetine (CYMBALTA) 30 MG capsule One daily to help depression and pains Patient taking differently: Take 30 mg by mouth daily.  10/12/14  Yes Estill Dooms, MD  erythromycin ophthalmic ointment Place 1 application into both eyes at bedtime.   Yes Historical Provider, MD  gabapentin (NEURONTIN) 300 MG capsule Take 6 capsules at night to relieve leg pains Patient taking differently: Take 1,800-2,400 mg by mouth at bedtime.  10/26/14  Yes Estill Dooms, MD  HYDROcodone-acetaminophen (NORCO/VICODIN) 5-325 MG per tablet Take 1 tablet up to 4 times daily for knee pain Patient taking differently: Take 1 tablet by mouth 4 (four) times daily as needed for moderate pain or severe pain.  10/26/14  Yes Estill Dooms, MD  Multiple Vitamins-Minerals (MULTIVITAMIN WITH MINERALS) tablet Take 1 tablet by mouth daily.    Yes Historical Provider, MD  Multiple Vitamins-Minerals (PRESERVISION AREDS) CAPS Take 2 capsules by mouth daily. In morning   Yes Historical Provider, MD  omeprazole (PRILOSEC) 20 MG capsule Take 20 mg by mouth daily.   Yes Historical Provider, MD  Polyethyl Glycol-Propyl Glycol (SYSTANE) 0.4-0.3 % SOLN Place 1 drop into both eyes 4 (four) times daily as needed (dry eyes). One drop 4 times daily as needed for dry eyes   Yes Historical Provider, MD  polyethylene glycol (MIRALAX / GLYCOLAX) packet Take 17 g by mouth daily.   Yes Historical Provider, MD  Red Yeast Rice 600 MG TABS Take 3 tablets by mouth daily. In morning   Yes Historical Provider, MD  senna-docusate (SENOKOT-S) 8.6-50 MG per  tablet Take 3 tablets by mouth at bedtime.   Yes Historical Provider, MD  tamsulosin (FLOMAX) 0.4 MG CAPS capsule TAKE ONE CAPSULE BY MOUTH EVERY DAY. 07/19/14  Yes Estill Dooms, MD  Vitamin D, Cholecalciferol, 1000 UNITS TABS 2 daily for vitamin D supplementation Patient taking differently: Take 1,000 mg by mouth daily.  06/09/13  Yes Estill Dooms, MD  zolpidem (AMBIEN) 10 MG tablet TAKE 1 TABLET BY MOUTH DAILY AT BEDTIME FOR SLEEP Patient taking differently: TAKE 1 TABLET BY MOUTH DAILY AT BEDTIME AS NEEDED FOR SLEEP 10/18/14  Yes Tiffany L Reed, DO    Allergies:  No Known Allergies  Social History:  Ambulatory   walker    From facility [facility]   reports that he quit smoking about 26 years ago. His smoking use included Cigars. He has never used smokeless tobacco. He reports that he drinks about 0.6 oz of alcohol per week. He reports that he does not use illicit drugs.    Family History: family history includes Cancer in his sister; Heart disease in his father.    Physical Exam: Patient Vitals for the past 24 hrs:  BP Temp Temp src Pulse Resp SpO2  10/27/14 1800 - - - - - 91 %  10/27/14 1600 127/76 mmHg - - 89 14 -  10/27/14 1555 - - - - - 94 %  10/27/14 1540 - - - - - 91 %  10/27/14 1533 128/80 mmHg 98.6 F (37 C) Oral 97 18 (!) 88 %  10/27/14 1521 - - - - - 94 %    1. General:  in No Acute distress 2. Psychological: Alert and   Oriented, has difficulty hearing 3. Head/ENT:    Dry Mucous Membranes                          Head Non traumatic, neck supple                          Normal   Dentition 4. SKIN: normal   Skin turgor,  Skin clean Dry and intact no rash, Skin sun damage 5. Heart: Regular rate and rhythmloud systolic murmur, Rub or gallop 6. Lungs:   no wheezes, some crackles,  Distant breath sounds at the bases 7. Abdomen: Soft, non-tender, slightly distended 8. Lower extremities: no clubbing, cyanosis, or edema 9. Neurologically  Grossly intact, moving all 4 extremities equally 10. MSK: Normal range of motion  body mass index is unknown because there is no weight on file.   Labs on Admission:   Results for orders placed or performed during the hospital encounter of 10/27/14 (from the past 24 hour(s))  Urinalysis, Routine w reflex microscopic     Status: Abnormal   Collection Time: 10/27/14  3:26 PM  Result Value Ref Range   Color, Urine YELLOW YELLOW   APPearance CLOUDY (A) CLEAR   Specific Gravity, Urine 1.017 1.005 -  1.030   pH 6.0 5.0 - 8.0   Glucose, UA NEGATIVE NEGATIVE mg/dL   Hgb urine dipstick MODERATE (A) NEGATIVE   Bilirubin Urine NEGATIVE NEGATIVE   Ketones, ur NEGATIVE NEGATIVE mg/dL   Protein, ur NEGATIVE NEGATIVE mg/dL   Urobilinogen, UA 0.2 0.0 - 1.0 mg/dL   Nitrite NEGATIVE NEGATIVE   Leukocytes, UA SMALL (A) NEGATIVE  Urine microscopic-add on     Status: None   Collection Time: 10/27/14  3:26 PM  Result Value Ref Range   Squamous Epithelial / LPF RARE RARE   WBC, UA 0-2 <3 WBC/hpf   RBC / HPF TOO NUMEROUS TO COUNT <3 RBC/hpf  CBC with Differential     Status: Abnormal   Collection Time: 10/27/14  3:52 PM  Result Value Ref Range   WBC 9.7 4.0 - 10.5 K/uL   RBC 3.85 (L) 4.22 - 5.81 MIL/uL   Hemoglobin 10.5 (L) 13.0 - 17.0 g/dL   HCT 33.4 (L) 39.0 - 52.0 %   MCV 86.8 78.0 - 100.0 fL   MCH 27.3 26.0 - 34.0 pg   MCHC 31.4 30.0 - 36.0 g/dL   RDW 18.9 (H) 11.5 - 15.5 %   Platelets 421 (H) 150 - 400 K/uL   Neutrophils Relative % 75 43 - 77 %   Neutro Abs 7.3 1.7 - 7.7 K/uL   Lymphocytes Relative 12 12 - 46 %   Lymphs Abs 1.2 0.7 - 4.0 K/uL   Monocytes Relative 12 3 - 12 %   Monocytes Absolute 1.2 (H) 0.1 - 1.0 K/uL   Eosinophils Relative 1 0 - 5 %   Eosinophils Absolute 0.1 0.0 - 0.7 K/uL   Basophils Relative 0 0 - 1 %   Basophils Absolute 0.0 0.0 - 0.1 K/uL  Comprehensive metabolic panel     Status: Abnormal   Collection Time: 10/27/14  3:52 PM  Result Value Ref Range   Sodium  133 (L) 135 - 145 mmol/L   Potassium 4.1 3.5 - 5.1 mmol/L   Chloride 99 96 - 112 mmol/L   CO2 26 19 - 32 mmol/L   Glucose, Bld 110 (H) 70 - 99 mg/dL   BUN 17 6 - 23 mg/dL   Creatinine, Ser 1.25 0.50 - 1.35 mg/dL   Calcium 9.1 8.4 - 10.5 mg/dL   Total Protein 6.5 6.0 - 8.3 g/dL   Albumin 3.8 3.5 - 5.2 g/dL   AST 19 0 - 37 U/L   ALT 13 0 - 53 U/L   Alkaline Phosphatase 63 39 - 117 U/L   Total Bilirubin 0.8 0.3 - 1.2 mg/dL   GFR calc non Af Amer 50 (L) >90 mL/min   GFR calc Af Amer 58 (L) >90 mL/min   Anion gap 8 5 - 15  Lipase, blood     Status: None   Collection Time: 10/27/14  3:52 PM  Result Value Ref Range   Lipase 26 11 - 59 U/L  I-Stat Troponin, ED (not at [facility])     Status: None   Collection Time: 10/27/14  3:57 PM  Result Value Ref Range   Troponin i, poc 0.04 0.00 - 0.08 ng/mL   Comment 3          Brain natriuretic peptide     Status: Abnormal   Collection Time: 10/27/14  3:57 PM  Result Value Ref Range   B Natriuretic Peptide 859.9 (H) 0.0 - 100.0 pg/mL   too numerous to count red blood cells no evidence  of UTI   No results found for: HGBA1C  Estimated Creatinine Clearance: 43 mL/min (by C-G formula based on Cr of 1.25).  BNP (last 3 results) No results for input(s): PROBNP in the last 8760 hours.  Other results:  I have pearsonaly reviewed this: ECG REPORT  HR95 rhythm: sinus rhythm with PVCs evidence of LVH  ST&T Change:  no acute evidence of ischemia   QTC  465    There were no vitals filed for this visit.   Cultures:    Component Value Date/Time   SDES NOSE 03/30/2011 1200   SPECREQUEST NONE 03/30/2011 1200   CULT STAPHYLOCOCCUS AUREUS Note:  NO MRSA ISOLATED 03/30/2011 1200   REPTSTATUS 04/02/2011 FINAL 03/30/2011 1200     Radiological Exams on Admission: Dg Chest 2 View  10/27/2014   CLINICAL DATA:  Chest pain and short of breath  EXAM: CHEST  2 VIEW  COMPARISON:  03/19/2012.  FINDINGS: Bilateral airspace disease, perihilar predominance.  Small bilateral pleural effusions with bibasilar atelectasis. Cardiac enlargement. Previously the lungs are clear.  IMPRESSION: Congestive heart failure with edema and small effusions.   Electronically Signed   By: Franchot Gallo M.D.   On: 10/27/2014 16:30   Ct Abdomen Pelvis W Contrast  10/27/2014   CLINICAL DATA:  Lower abdominal pain for 3 weeks  EXAM: CT ABDOMEN AND PELVIS WITH CONTRAST  TECHNIQUE: Multidetector CT imaging of the abdomen and pelvis was performed using the standard protocol following bolus administration of intravenous contrast.  CONTRAST:  44mL OMNIPAQUE IOHEXOL 300 MG/ML SOLN, 151mL OMNIPAQUE IOHEXOL 300 MG/ML SOLN  COMPARISON:  None.  FINDINGS: Bilateral pleural effusions are noted with bibasilar infiltrate.  The gallbladder has been surgically removed. The liver, spleen and pancreas are within normal limits. The adrenal glands are enlarged bilaterally likely related hypertrophy. No definitive mass lesion is seen.  The kidneys are well visualized bilaterally and demonstrate bilateral renal cystic change. This is much worse on the left than the right with at least 2 dominant cysts. These measure 8.1 cm and 10.8 cm. Delayed images demonstrate normal excretion of contrast material. Aortoiliac calcifications are seen.  Small anterior abdominal wall hernia is noted adjacent to the umbilicus containing fat. No herniation of bowel loops within is noted. The prostate shows multiple brachytherapy seeds. The bladder is partially distended. A irregular enhancing lesion is noted arising from the inferior aspect of the urinary bladder. This is consistent with a bladder neoplasm until proven otherwise. No significant pelvic lymphadenopathy is identified.  The bony structures show changes of prior left hip pinning. Degenerative changes of the lumbar spine are seen.  IMPRESSION: Changes suggestive of bladder mass. Direct visualization is recommended.  Bilateral lower lobe infiltrates with associated  effusions.  Bilateral renal cystic change.  Fat containing periumbilical hernia.   Electronically Signed   By: Inez Catalina M.D.   On: 10/27/2014 17:49    Chart has been reviewed  Assessment/Plan  81 year old gentleman with history of 6 stenosis, and prostate cancer presents with worsening fatigue was found to be hypoxic with bilateral pleural effusions and bilateral infiltrates with a new finding of likely new bladder cancer per CT scan with some urinary symptoms   Present on Admission:  . Hypoxia - likely multifactorial combination of pneumonia versus some fluid overload. Patient was given Lasix will continue to monitor. Obtain echogram in the morning and workup for possible CHF. Given possibility of infiltrates per CT scan Will treat for community-acquired pneumonia  . Abnormal CT of the  abdomen - evidence of likely bladder cancer per CT scan. Patient will need to follow up as an outpatient with Dr. Gaynelle Arabian as per urology  . Anemia - chronic continue to monitor  . CAP (community acquired pneumonia) - will admit for treatment of CAP will start on appropriate antibiotic coverage.   Obtain sputum cultures, blood cultures if febrile or if decompensates.  Provide oxygen as needed.   urinary difficulty. It is unclear if he has true retention this patient was finally able to urinate. For now overnight will put a Foley. Expect to be able to discontinue that in next 24 hours  Possible CHF exacerbation patient does have history of aortic stenosis. We will obtain repeat echo gram if evidence of systolic or diastolic heart failure would initiate low dose ACE inhibitor.  for nocycle cardiac enzymes and monitor on telemetry   Prophylaxis:  Lovenox, Protonix  CODE STATUS:  DNR/DNI as per patient  Other plan as per orders.  is has been discussed with family 2 sons and a daughter at bedside  I have spent a total of 65 min on this admission additional time was taken to discuss case with urology Dr.  Alinda Money Who kindly agreed to assist if needed with foley placement and recommends patient to be followed up as an outpatient with Dr. Gaynelle Arabian Regarding possible new bladder mass.  [place] 10/27/2014, 6:41 PM  [facility]  Pager [phone]   after 2 AM please page floor coverage PA If 7AM-7PM, please contact the day team taking care of the patient  [email or web address]  Password [email or web address]

## 2014-10-27 NOTE — Clinical Social Work Note (Signed)
Clinical Social Work Assessment  Patient Details  Name: Justin Deleon MRN: [id number] Date of Birth: Feb 02, 1926  Date of referral:  10/27/14               Reason for consult:  Other (Comment Required) (Pt comes from [facility])                Permission sought to share information with:    Permission granted to share information::  No  Name::        Agency::     Relationship::     Contact Information:     Housing/Transportation Living arrangements for the past 2 months:  [place] of Information:  Patient, Adult Children Patient Interpreter Needed:  None Criminal Activity/Legal Involvement Pertinent to Current Situation/Hospitalization:  No - Comment as needed Significant Relationships:  Adult Children Lives with:  Self Do you feel safe going back to the place where you live?  Yes Need for family participation in patient care:  Yes (Comment)  Care giving concerns:  Family states they are not sure if the patient will need an upgraded level of care at this time. The pt is currently in the independent unit at [facility].   Social Worker assessment / plan:  CSW met with pt at bedside. Sons were present. Nurse was present. Son confirms that the pt presents to [facility] from [facility]. He states that the pt is currently in [facility] living. CSW asked family and pt their thoughts on having a higher level of care. The family stated that they are not sure at this time and it will depend on how he does during this hospitalization.  Son states that the pt is here due to having pneumonia and bladder issues. He informed CSW that the bladder issues could be due to some type of cancer. However, they are not sure yet.  Son informed CSW that the pt has not fallen since he broke his hip years ago.They state that the pt currently ambulates with a walker.  Patient informed CSW that prior to coming into [facility] he has been able to complete his ADL's  independently.  Family states that they do not have any further questions at this time. Family appears to be a great support for patient. They informed CSW that they live in [place] and often check on the pt.  Son/ Richardson Landry [phone] ; Also POA Son/ John [phone]  Employment status:    Insurance information:  Medicare PT Recommendations:   (When asked about higher level of care. Family stated they are not sure at this time.) Information / Referral to community resources:  Other (Comment Required) (it is undetermined at this time if the pt needs a higher level of care.)  Patient/Family's Response to care:  Sons appear to be supportive. They state that they both live in [place] and check on the pt often.   Patient/Family's Understanding of and Emotional Response to Diagnosis, Current Treatment, and Prognosis:  Family appears to be supportive and understanding that the pt may need to eventually have an upgraded level of care. However, they state that at this time... They are not sure.  Emotional Assessment Appearance:  Appears stated age Attitude/Demeanor/Rapport:  Other Affect (typically observed):  Appropriate Orientation:  Oriented to Self, Oriented to Place, Oriented to  Time, Oriented to Situation Alcohol / Substance use:    Psych involvement (Current and /or in the community):  No (Comment)  Discharge Needs  Concerns to be addressed:  No discharge needs identified Readmission within the last 30 days:    Current discharge risk:  None Barriers to Discharge:  No Barriers Identified   Bernita Buffy, LCSW 10/27/2014, 11:18 PM

## 2014-10-27 NOTE — ED Notes (Signed)
Bed: WA16 Expected date:  Expected time:  Means of arrival:  Comments: ems 

## 2014-10-27 NOTE — Progress Notes (Signed)
CSW met with pt at bedside. Sons were present. Nurse was present. Son confirms that the pt presents to [facility] from [facility]. He states that the pt is currently in [facility] living. CSW asked family and pt their thoughts on having a higher level of care. The family stated that they are not sure at this time and it will depend on how he does during this hospitalization.  Son states that the pt is here due to having pneumonia and bladder issues. He informed CSW that the bladder issues could be due to some type of cancer. However, they are not sure yet.  Son informed CSW that the pt has not fallen since he broke his hip years ago.They state that the pt currently ambulates with a walker.  Patient informed CSW that prior to coming into [facility] he has been able to complete his ADL's independently.  Family states that they do not have any further questions at this time. Family appears to be a great support for patient. They informed CSW that they live in [place] and often check on the pt.  Son/ Richardson Landry [phone] ; Also POA Son/ John [phone] Willette Brace [phone] ED CSW 10/27/2014 9:02 PM

## 2014-10-27 NOTE — ED Notes (Signed)
MD at bedside. 

## 2014-10-27 NOTE — ED Notes (Signed)
Pt to CT

## 2014-10-27 NOTE — ED Notes (Signed)
Pt back from x-ray.

## 2014-10-27 NOTE — ED Notes (Addendum)
Per ems [facility] is from [facility], independent living, c/o lower abdominal pain x3 weeks. Change in medications 3 weeks ago to cymbalta. Minor pain with palpation. Denies n/v/d. Denies constipation.   Upon rn assessment, pt reports he has "been feeling lousy for 3 weeks". Abdominal pain 8/10 at present. Denies dysuria. Denies n/v/d.

## 2014-10-28 DIAGNOSIS — D649 Anemia, unspecified: Secondary | ICD-10-CM | POA: Diagnosis present

## 2014-10-28 DIAGNOSIS — R778 Other specified abnormalities of plasma proteins: Secondary | ICD-10-CM | POA: Diagnosis present

## 2014-10-28 DIAGNOSIS — J9601 Acute respiratory failure with hypoxia: Secondary | ICD-10-CM | POA: Diagnosis present

## 2014-10-28 DIAGNOSIS — R0902 Hypoxemia: Secondary | ICD-10-CM

## 2014-10-28 DIAGNOSIS — I350 Nonrheumatic aortic (valve) stenosis: Secondary | ICD-10-CM

## 2014-10-28 DIAGNOSIS — I5033 Acute on chronic diastolic (congestive) heart failure: Secondary | ICD-10-CM

## 2014-10-28 DIAGNOSIS — R7989 Other specified abnormal findings of blood chemistry: Secondary | ICD-10-CM

## 2014-10-28 DIAGNOSIS — F0390 Unspecified dementia without behavioral disturbance: Secondary | ICD-10-CM

## 2014-10-28 LAB — COMPREHENSIVE METABOLIC PANEL
ALBUMIN: 3.3 g/dL — AB (ref 3.5–5.2)
ALT: 11 U/L (ref 0–53)
AST: 17 U/L (ref 0–37)
Alkaline Phosphatase: 56 U/L (ref 39–117)
Anion gap: 9 (ref 5–15)
BUN: 17 mg/dL (ref 6–23)
CO2: 29 mmol/L (ref 19–32)
Calcium: 8.8 mg/dL (ref 8.4–10.5)
Chloride: 96 mmol/L (ref 96–112)
Creatinine, Ser: 1.21 mg/dL (ref 0.50–1.35)
GFR calc Af Amer: 60 mL/min — ABNORMAL LOW (ref >90)
GFR calc non Af Amer: 52 mL/min — ABNORMAL LOW (ref >90)
GLUCOSE: 102 mg/dL — AB (ref 70–99)
POTASSIUM: 3.5 mmol/L (ref 3.5–5.1)
SODIUM: 134 mmol/L — AB (ref 135–145)
Total Bilirubin: 0.4 mg/dL (ref 0.3–1.2)
Total Protein: 5.7 g/dL — ABNORMAL LOW (ref 6.0–8.3)

## 2014-10-28 LAB — BRAIN NATRIURETIC PEPTIDE: B NATRIURETIC PEPTIDE 5: 1327.8 pg/mL — AB (ref 0.0–100.0)

## 2014-10-28 LAB — TROPONIN I
TROPONIN I: 0.19 ng/mL — AB (ref <0.031)
Troponin I: 0.34 ng/mL — ABNORMAL HIGH (ref <0.031)

## 2014-10-28 LAB — TSH: TSH: 1.283 u[IU]/mL (ref 0.350–4.500)

## 2014-10-28 LAB — STREP PNEUMONIAE URINARY ANTIGEN: Strep Pneumo Urinary Antigen: NEGATIVE

## 2014-10-28 MED ORDER — ASPIRIN 325 MG PO TABS
325.0000 mg | ORAL_TABLET | Freq: Every day | ORAL | Status: DC
  Administered 2014-10-28: 325 mg via ORAL
  Filled 2014-10-28: qty 1

## 2014-10-28 MED ORDER — MORPHINE SULFATE 2 MG/ML IJ SOLN
1.0000 mg | INTRAMUSCULAR | Status: DC | PRN
  Administered 2014-10-28 (×3): 2 mg via INTRAVENOUS
  Administered 2014-11-01: 1 mg via INTRAVENOUS
  Filled 2014-10-28 (×5): qty 1

## 2014-10-28 MED ORDER — OXYCODONE-ACETAMINOPHEN 5-325 MG PO TABS
1.0000 | ORAL_TABLET | ORAL | Status: DC | PRN
  Administered 2014-10-28 – 2014-11-01 (×13): 2 via ORAL
  Filled 2014-10-28 (×13): qty 2

## 2014-10-28 MED ORDER — MORPHINE SULFATE 2 MG/ML IJ SOLN
1.0000 mg | Freq: Once | INTRAMUSCULAR | Status: AC
  Administered 2014-10-28: 1 mg via INTRAVENOUS
  Filled 2014-10-28: qty 1

## 2014-10-28 MED ORDER — FUROSEMIDE 10 MG/ML IJ SOLN
40.0000 mg | Freq: Two times a day (BID) | INTRAMUSCULAR | Status: DC
  Administered 2014-10-28 – 2014-10-29 (×3): 40 mg via INTRAVENOUS
  Filled 2014-10-28 (×3): qty 4

## 2014-10-28 NOTE — Progress Notes (Signed)
[facility] PROGRESS NOTE  Justin Deleon GYK:[id number] DOB: 1925-08-26 DOA: 10/27/2014  PCP: Estill Dooms, MD  Brief HPI: 81 year old Caucasian male with a past medical history that is quite significant and mentioned below, presented with generalized weakness. He was found to be short of breath. He was complaining of abdominal pain. Chest x-ray suggested congestive heart failure versus pneumonia. He was admitted for further management.  Past medical history:  Past Medical History  Diagnosis Date  . Depressed   . Hernia   . Kidney stones   . Hypertension   . Prostate cancer   . Cataract 2012    Mohs MD  . Hypertrophy of prostate with urinary obstruction and other lower urinary tract symptoms (LUTS) 08/26/2012  . Edema 08/26/2012  . Ectropion, unspecified 05/27/2012  . Abnormality of gait 11/27/2011  . Unspecified hereditary and idiopathic peripheral neuropathy 08/28/2011  . Basal cell carcinoma of scalp and skin of neck 06/26/2011  . Pain in limb 05/28/2011  . Closed fracture of midcervical section of femur 05/28/2011  . Other and unspecified hyperlipidemia 04/06/2011  . Anemia, unspecified 04/06/2011  . Peripheral vascular disease, unspecified 04/06/2011  . Reflux esophagitis 04/06/2011  . Unspecified constipation 04/06/2011  . Insomnia, unspecified 04/06/2011  . Undiagnosed cardiac murmurs 04/06/2011  . Hypertrophy of prostate with urinary obstruction and other lower urinary tract symptoms (LUTS) 2013  . Depression, acute   . Aortic valve stenosis, rheumatic   . Kidney stones     Consultants: Cardiology, urology over the phone  Procedures: 2-D echocardiogram is pending  Antibiotics: Ceftriaxone and azithromycin 4/20  Subjective: Patient complains of upper abdominal discomfort. He appears to be in some distress due to this pain. Subsequently, he became more short of breath and was noted to have a saturation of 70s on room air. He had a lot of difficulty explaining  his symptoms. He likely has cognitive impairment. His son was at bedside. He also appeared to be very anxious.  Objective: Vital Signs  Filed Vitals:   10/27/14 1901 10/27/14 2100 10/28/14 0527 10/28/14 1404  BP: 121/79 129/72 103/57 117/71  Pulse: 111 96 80 104  Temp: 98.5 F (36.9 C) 98.1 F (36.7 C) 98.4 F (36.9 C) 98.1 F (36.7 C)  TempSrc: Oral Oral Oral Oral  Resp: 18 17 17 16   Height:  5\' 11"  (1.803 m)    Weight:  77.2 kg (170 lb 3.1 oz) 77.1 kg (169 lb 15.6 oz)   SpO2: 92% 94% 89% 93%    Intake/Output Summary (Last 24 hours) at 10/28/14 1429 Last data filed at 10/28/14 1416  Gross per 24 hour  Intake    420 ml  Output   1975 ml  Net  -1555 ml   Filed Weights   10/27/14 2100 10/28/14 0527  Weight: 77.2 kg (170 lb 3.1 oz) 77.1 kg (169 lb 15.6 oz)    General appearance: alert, cooperative, appears stated age and mild distress Head: Normocephalic, without obvious abnormality, atraumatic Resp: Crackles bilaterally with wheezing. Diminished air entry. Tachypnea. Cardio: regular rate and rhythm, S1, S2 normal, no murmur, click, rub or gallop GI: soft, non-tender; bowel sounds normal; no masses,  no organomegaly Extremities: extremities normal, atraumatic, no cyanosis or edema Neurologic: Alert. Somewhat distracted. Oriented to person, place. No obvious neurological deficits noted.  Lab Results:  Basic Metabolic Panel:  Recent Labs Lab 10/27/14 1552 10/28/14 0350  NA 133* 134*  K 4.1 3.5  CL 99 96  CO2 26 29  GLUCOSE  110* 102*  BUN 17 17  CREATININE 1.25 1.21  CALCIUM 9.1 8.8   Liver Function Tests:  Recent Labs Lab 10/27/14 1552 10/28/14 0350  AST 19 17  ALT 13 11  ALKPHOS 63 56  BILITOT 0.8 0.4  PROT 6.5 5.7*  ALBUMIN 3.8 3.3*    Recent Labs Lab 10/27/14 1552  LIPASE 26   CBC:  Recent Labs Lab 10/27/14 1552  WBC 9.7  NEUTROABS 7.3  HGB 10.5*  HCT 33.4*  MCV 86.8  PLT 421*   Cardiac Enzymes:  Recent Labs Lab 10/27/14 2116  10/28/14 0254 10/28/14 0948  TROPONINI 0.15* 0.34* 0.19*   BNP (last 3 results)  Recent Labs  10/27/14 1557 10/28/14 0320  BNP 859.9* 1327.8*    Studies/Results: Dg Chest 2 View  10/27/2014   CLINICAL DATA:  Chest pain and short of breath  EXAM: CHEST  2 VIEW  COMPARISON:  03/19/2012.  FINDINGS: Bilateral airspace disease, perihilar predominance. Small bilateral pleural effusions with bibasilar atelectasis. Cardiac enlargement. Previously the lungs are clear.  IMPRESSION: Congestive heart failure with edema and small effusions.   Electronically Signed   By: Franchot Gallo M.D.   On: 10/27/2014 16:30   Ct Abdomen Pelvis W Contrast  10/27/2014   CLINICAL DATA:  Lower abdominal pain for 3 weeks  EXAM: CT ABDOMEN AND PELVIS WITH CONTRAST  TECHNIQUE: Multidetector CT imaging of the abdomen and pelvis was performed using the standard protocol following bolus administration of intravenous contrast.  CONTRAST:  22mL OMNIPAQUE IOHEXOL 300 MG/ML SOLN, 156mL OMNIPAQUE IOHEXOL 300 MG/ML SOLN  COMPARISON:  None.  FINDINGS: Bilateral pleural effusions are noted with bibasilar infiltrate.  The gallbladder has been surgically removed. The liver, spleen and pancreas are within normal limits. The adrenal glands are enlarged bilaterally likely related hypertrophy. No definitive mass lesion is seen.  The kidneys are well visualized bilaterally and demonstrate bilateral renal cystic change. This is much worse on the left than the right with at least 2 dominant cysts. These measure 8.1 cm and 10.8 cm. Delayed images demonstrate normal excretion of contrast material. Aortoiliac calcifications are seen.  Small anterior abdominal wall hernia is noted adjacent to the umbilicus containing fat. No herniation of bowel loops within is noted. The prostate shows multiple brachytherapy seeds. The bladder is partially distended. A irregular enhancing lesion is noted arising from the inferior aspect of the urinary bladder. This is  consistent with a bladder neoplasm until proven otherwise. No significant pelvic lymphadenopathy is identified.  The bony structures show changes of prior left hip pinning. Degenerative changes of the lumbar spine are seen.  IMPRESSION: Changes suggestive of bladder mass. Direct visualization is recommended.  Bilateral lower lobe infiltrates with associated effusions.  Bilateral renal cystic change.  Fat containing periumbilical hernia.   Electronically Signed   By: Inez Catalina M.D.   On: 10/27/2014 17:49    Medications:  Scheduled: . azithromycin  500 mg Intravenous Q24H  . cefTRIAXone (ROCEPHIN)  IV  1 g Intravenous Q24H  . docusate sodium  300 mg Oral Daily  . DULoxetine  30 mg Oral Daily  . enoxaparin (LOVENOX) injection  40 mg Subcutaneous Q24H  . erythromycin  1 application Both Eyes QHS  . furosemide  40 mg Intravenous Q12H  . gabapentin  1,800 mg Oral QHS  . pantoprazole  40 mg Oral Daily  . polyethylene glycol  17 g Oral Daily  . senna-docusate  3 tablet Oral QHS  . sodium chloride  3 mL  Intravenous Q12H  . sodium chloride  3 mL Intravenous Q12H  . tamsulosin  0.4 mg Oral Daily   Continuous:  DDU:KGURKY chloride, sodium chloride, acetaminophen, morphine injection, ondansetron (ZOFRAN) IV, sodium chloride, sodium chloride, zolpidem  Assessment/Plan:  Active Problems:   Calcific aortic stenosis   Anemia   CHF (congestive heart failure)   Abnormal CT of the abdomen   Hypoxia   CAP (community acquired pneumonia)   Acute respiratory failure with hypoxia   Elevated troponin   Normocytic anemia    Acute respiratory failure with Hypoxia Most likely secondary to acute congestive heart failure. Type is unknown. Echocardiogram is pending. Last echocardiogram is from 2014, which revealed normal systolic function. His history of aortic stenosis is likely contributing. Pneumonia was also in the differential and the patient was started on antibiotics. Continue with IV Lasix for now.  Oxygen to maintain sats greater than 90%.  Likely acute congestive heart failure of unknown type with severe AS Echocardiogram is pending. Cardiology has been consulted. Lasix intravenously. Patient has aortic stenosis which is likely contributing. Echocardiogram will give more information.  Minimally elevated troponin EKG shows a nonspecific ST changes. Patient not very clear about the chest pain. This is most likely demand ischemia. We await echocardiogram. Await cardiology input. Give aspirin.  Questionable community-acquired pneumonia Continue with antibiotics for now.  Possible urinary bladder mass Discussed with urology at the time of admission. They recommended outpatient management. Continue Foley catheter. Patient has a remote history of prostate cancer.  Normocytic anemia Stable. Continue to monitor.  DVT Prophylaxis: Lovenox    Code Status: DO NOT RESUSCITATE  Family Communication: Discussed with the patient's son and daughter  Disposition Plan: Not ready for discharge. Await cardiology input. He is from independent living facility. Will likely need higher level of care. PT and OT when more stable.     LOS: 1 day   [facility] Pager [phone] 10/28/2014, 2:29 PM  If 7PM-7AM, please contact night-coverage at [email or web address], password [email or web address]

## 2014-10-28 NOTE — Progress Notes (Signed)
MD has been made aware that patient has requested pain and anxiety medications. Waiting on orders. Will continue to monitor.  Justin Deleon

## 2014-10-28 NOTE — Consult Note (Signed)
Admit date: 10/27/2014 Referring Physician  Dr. Maryland Pink Primary Physician GREEN, Viviann Spare, MD Primary Cardiologist  Dr. Daneen Schick Reason for Consultation  Heart failure  HPI: 81 year old admitted with generalized fatigue, weakness on 10/27/14 which had been occurring over the past few weeks. His dementia seems quite advanced. Decrease in appetite, chills, no cough, no fever. Had 8/10 abdominal discomfort in emergency department. Hypoxemia was noted on room air down to 88%, 4 L of oxygen, chest x-ray showed heart failure findings, increased interstitial markings, marked change from prior x-ray. John, his son, has not specifically noted any increased work of breathing.  Cardiac issues include severe aortic stenosis with valve area of 0.63 cm in 2014 with severe pulmonary hypertension, secondary with peak pressures up to 71 mmHg. Mean gradient at that time was 35 mmHg. Ejection fraction was normal.  Creatinine is 1.21, albumin 3.3, BNP 1300, troponin remains constant at 0.15, 0.34, 0.19. TSH is normal.   Currently, he demonstrates minimal increased work of breathing laying in bed at 20. He kept repeating that up until 3 weeks ago he was working Monday Wednesday and Friday. He thought he was in [place]. His son Jenny Reichmann helped reorient him but he quickly became disoriented once again.   PMH:   Past Medical History  Diagnosis Date  . Depressed   . Hernia   . Kidney stones   . Hypertension   . Prostate cancer   . Cataract 2012    Mohs MD  . Hypertrophy of prostate with urinary obstruction and other lower urinary tract symptoms (LUTS) 08/26/2012  . Edema 08/26/2012  . Ectropion, unspecified 05/27/2012  . Abnormality of gait 11/27/2011  . Unspecified hereditary and idiopathic peripheral neuropathy 08/28/2011  . Basal cell carcinoma of scalp and skin of neck 06/26/2011  . Pain in limb 05/28/2011  . Closed fracture of midcervical section of femur 05/28/2011  . Other and unspecified  hyperlipidemia 04/06/2011  . Anemia, unspecified 04/06/2011  . Peripheral vascular disease, unspecified 04/06/2011  . Reflux esophagitis 04/06/2011  . Unspecified constipation 04/06/2011  . Insomnia, unspecified 04/06/2011  . Undiagnosed cardiac murmurs 04/06/2011  . Hypertrophy of prostate with urinary obstruction and other lower urinary tract symptoms (LUTS) 2013  . Depression, acute   . Aortic valve stenosis, rheumatic   . Kidney stones     PSH:   Past Surgical History  Procedure Laterality Date  . Cholecystectomy  1963/1978  . Hernia repair  5462    umbilical  . Spine surgery  2010    lumbar-L3-4/L4-5  . Fracture surgery Left 03/2011    hip  . Eye surgery      retina repair right  . Esophagogastroduodenoscopy N/A 04/17/2013    Procedure: ESOPHAGOGASTRODUODENOSCOPY (EGD);  Surgeon: Wonda Horner, MD;  Location: [facility] ENDOSCOPY;  Service: Endoscopy;  Laterality: N/A;  . Colonoscopy N/A 04/21/2013    Procedure: COLONOSCOPY;  Surgeon: Jeryl Columbia, MD;  Location: [facility] ENDOSCOPY;  Service: Endoscopy;  Laterality: N/A;  . Skin cancer excision  11/10/13    2 lesions removed back of neck Dr. Nevada Crane  . Skin cancer excision  03/23/14    below left ear   Allergies:  Review of patient's allergies indicates no known allergies. Prior to Admit Meds:   Prior to Admission medications   Medication Sig Start Date End Date Taking? Authorizing Provider  docusate sodium (COLACE) 100 MG capsule Take 300 mg by mouth daily.   Yes Historical Provider, MD  DULoxetine (CYMBALTA) 30 MG capsule One  daily to help depression and pains Patient taking differently: Take 30 mg by mouth daily.  10/12/14  Yes Estill Dooms, MD  erythromycin ophthalmic ointment Place 1 application into both eyes at bedtime.   Yes Historical Provider, MD  gabapentin (NEURONTIN) 300 MG capsule Take 6 capsules at night to relieve leg pains Patient taking differently: Take 1,800-2,400 mg by mouth at bedtime.  10/26/14  Yes Estill Dooms, MD    HYDROcodone-acetaminophen (NORCO/VICODIN) 5-325 MG per tablet Take 1 tablet up to 4 times daily for knee pain Patient taking differently: Take 1 tablet by mouth 4 (four) times daily as needed for moderate pain or severe pain.  10/26/14  Yes Estill Dooms, MD  Multiple Vitamins-Minerals (MULTIVITAMIN WITH MINERALS) tablet Take 1 tablet by mouth daily.    Yes Historical Provider, MD  Multiple Vitamins-Minerals (PRESERVISION AREDS) CAPS Take 2 capsules by mouth daily. In morning   Yes Historical Provider, MD  omeprazole (PRILOSEC) 20 MG capsule Take 20 mg by mouth daily.   Yes Historical Provider, MD  Polyethyl Glycol-Propyl Glycol (SYSTANE) 0.4-0.3 % SOLN Place 1 drop into both eyes 4 (four) times daily as needed (dry eyes). One drop 4 times daily as needed for dry eyes   Yes Historical Provider, MD  polyethylene glycol (MIRALAX / GLYCOLAX) packet Take 17 g by mouth daily.   Yes Historical Provider, MD  Red Yeast Rice 600 MG TABS Take 3 tablets by mouth daily. In morning   Yes Historical Provider, MD  senna-docusate (SENOKOT-S) 8.6-50 MG per tablet Take 3 tablets by mouth at bedtime.   Yes Historical Provider, MD  tamsulosin (FLOMAX) 0.4 MG CAPS capsule TAKE ONE CAPSULE BY MOUTH EVERY DAY. 07/19/14  Yes Estill Dooms, MD  Vitamin D, Cholecalciferol, 1000 UNITS TABS 2 daily for vitamin D supplementation Patient taking differently: Take 1,000 mg by mouth daily.  06/09/13  Yes Estill Dooms, MD  zolpidem (AMBIEN) 10 MG tablet TAKE 1 TABLET BY MOUTH DAILY AT BEDTIME FOR SLEEP Patient taking differently: TAKE 1 TABLET BY MOUTH DAILY AT BEDTIME AS NEEDED FOR SLEEP 10/18/14  Yes Carver, DO   Fam HX:    Family History  Problem Relation Age of Onset  . Heart disease Father     MI  . Cancer Sister     bladder   Social HX:    History   Social History  . Marital Status: Widowed    Spouse Name: N/A  . Number of Children: 3  . Years of Education: college   Occupational History  . retired     Social History Main Topics  . Smoking status: Former Smoker    Types: Cigars    Quit date: 12/10/1987  . Smokeless tobacco: Never Used  . Alcohol Use: 0.6 oz/week    1 Cans of beer per week     Comment: 1 week  . Drug Use: No  . Sexual Activity: No   Other Topics Concern  . Not on file   Social History Narrative   Lives at [facility] with walker   Former smoker stopped 1989   Alcohol on can of beer a week    Exercise goes to [employer] 3 days a week, upper body     DNR, POA   Patient is right handed.   Patient drinks 2 cups of caffeine a day.  ROS: Challenging to obtain a clear review of systems however he currently denies any chest pain or shortness of breath. All 11 ROS were addressed and are negative except what is stated in the HPI   Physical Exam: Blood pressure 117/71, pulse 104, temperature 98.1 F (36.7 C), temperature source Oral, resp. rate 16, height 5\' 11"  (1.803 m), weight 169 lb 15.6 oz (77.1 kg), SpO2 93 %.   General: Elderly, moderately ill-appearing Head: Eyes PERRLA, No xanthomas.   Normal cephalic and atramatic  Lungs:   Mild crackles heard bilaterally across lung fields. Heart:   HRRR S1 S2 Pulses are 2+ & equal. 3/6 systolic crescendo/decrescendo murmur, no rubs, gallops.  No carotid bruit. No JVD.  No abdominal bruits.  Abdomen: Bowel sounds are positive, abdomen soft and non-tender without masses. No hepatosplenomegaly. Mildly protuberant Msk:  Back normal. Normal strength and tone for age. Extremities:  No clubbing, cyanosis or edema.  DP +1 Neuro: Conversant but not oriented, non-focal, MAE x 4 GU: Deferred, Foley catheter in place Rectal: Deferred Psych:  Continues to repeat himself, baseline dementia noted      Labs: Lab Results  Component Value Date   WBC 9.7 10/27/2014   HGB 10.5* 10/27/2014   HCT 33.4* 10/27/2014   MCV 86.8 10/27/2014   PLT 421* 10/27/2014     Recent Labs Lab  10/28/14 0350  NA 134*  K 3.5  CL 96  CO2 29  BUN 17  CREATININE 1.21  CALCIUM 8.8  PROT 5.7*  BILITOT 0.4  ALKPHOS 56  ALT 11  AST 17  GLUCOSE 102*    Recent Labs  10/27/14 2116 10/28/14 0254 10/28/14 0948  TROPONINI 0.15* 0.34* 0.19*   Lab Results  Component Value Date   CHOL 179 08/02/2014   HDL 65 08/02/2014   LDLCALC 99 08/02/2014   TRIG 75 08/02/2014   No results found for: DDIMER   Radiology:  Dg Chest 2 View  10/27/2014   CLINICAL DATA:  Chest pain and short of breath  EXAM: CHEST  2 VIEW  COMPARISON:  03/19/2012.  FINDINGS: Bilateral airspace disease, perihilar predominance. Small bilateral pleural effusions with bibasilar atelectasis. Cardiac enlargement. Previously the lungs are clear.  IMPRESSION: Congestive heart failure with edema and small effusions.   Electronically Signed   By: Franchot Gallo M.D.   On: 10/27/2014 16:30   Ct Abdomen Pelvis W Contrast  10/27/2014   CLINICAL DATA:  Lower abdominal pain for 3 weeks  EXAM: CT ABDOMEN AND PELVIS WITH CONTRAST  TECHNIQUE: Multidetector CT imaging of the abdomen and pelvis was performed using the standard protocol following bolus administration of intravenous contrast.  CONTRAST:  33mL OMNIPAQUE IOHEXOL 300 MG/ML SOLN, 139mL OMNIPAQUE IOHEXOL 300 MG/ML SOLN  COMPARISON:  None.  FINDINGS: Bilateral pleural effusions are noted with bibasilar infiltrate.  The gallbladder has been surgically removed. The liver, spleen and pancreas are within normal limits. The adrenal glands are enlarged bilaterally likely related hypertrophy. No definitive mass lesion is seen.  The kidneys are well visualized bilaterally and demonstrate bilateral renal cystic change. This is much worse on the left than the right with at least 2 dominant cysts. These measure 8.1 cm and 10.8 cm. Delayed images demonstrate normal excretion of contrast material. Aortoiliac calcifications are seen.  Small anterior abdominal wall hernia is noted adjacent to the  umbilicus containing fat. No herniation of bowel loops within is noted. The prostate shows multiple brachytherapy seeds. The bladder is partially distended. A irregular enhancing lesion  is noted arising from the inferior aspect of the urinary bladder. This is consistent with a bladder neoplasm until proven otherwise. No significant pelvic lymphadenopathy is identified.  The bony structures show changes of prior left hip pinning. Degenerative changes of the lumbar spine are seen.  IMPRESSION: Changes suggestive of bladder mass. Direct visualization is recommended.  Bilateral lower lobe infiltrates with associated effusions.  Bilateral renal cystic change.  Fat containing periumbilical hernia.   Electronically Signed   By: Inez Catalina M.D.   On: 10/27/2014 17:49   Personally viewed.  EKG:  Sinus tachycardia, heart rate 102 bpm with left ventricular hypertrophy with repolarization abnormality. When compared to 04/17/13 EKG, there is accentuation of J-point elevation in precordial leads. Personally viewed.   ECHO: 04/17/13  - Left ventricle: The cavity size was normal. There was mild focal basal hypertrophy of the septum. Systolic function was normal. The estimated ejection fraction was in the range of 55% to 60%. Possible hypokinesis of the apical myocardium. - Aortic valve: There was severe stenosis. Mild regurgitation. Valve area: 0.63cm^2(VTI). Valve area: 0.57cm^2 (Vmax). - Mitral valve: Calcified annulus. Moderate regurgitation. - Left atrium: The atrium was mildly to moderately dilated. - Right atrium: The atrium was mildly dilated. - Pulmonary arteries: Systolic pressure was severely increased. PA peak pressure: 74mm Hg (S).  ASSESSMENT/PLAN:    81 year old male admitted with acute diastolic heart failure with severe aortic stenosis, severe secondary pulmonary hypertension, minimally elevated troponin.  1. Acute on chronic diastolic heart failure - his underlying aortic  stenosis which was severe in 2014 as well as potential fluid overload is playing a role with his acute onset diastolic heart failure. His mild troponin elevation is secondary to cardiac stretch in the setting of aortic stenosis/heart failure. I agree with IV Lasix being careful to watch for signs of hypotension with his severe aortic stenosis. Creatinine currently is 1.21. I agree that his chest x-ray most likely is secondary to heart failure. He however does not endorse any shortness of breath but his history is very limited. Community-acquired pneumonia is empirically being treated with antibiotics in case. Echocardiogram has been ordered. New bladder mass noted on CT scan, has a degree of urinary retention. Foley catheter placed by urology. Ultimately, he would be high risk from a cardiac perspective for any surgical procedure/general anesthesia. I discussed this with his son Jenny Reichmann. CODE STATUS DNR/DNI.   2. Severe aortic stenosis - Given his underlying medical issues,  bladder mass possible cancer, underlying dementia, he would not be a candidate for surgical replacement or TAVR. I explained this to his son, Jenny Reichmann.  - Symptomatic aortic stenosis, heart failure. When this occurs, overall mortality over the next 12 months can approach 50%. Try to maintain quality of life, decongestants as best as possible for instance.   - Await repeat echocardiogram which has been ordered.  We will follow along with you. Lengthy discussion with he and his son.  Candee Furbish, MD  10/28/2014  5:50 PM

## 2014-10-28 NOTE — Progress Notes (Signed)
Patient request that son DPOA be called regarding status. Patient anxious, states that he needs something for this.

## 2014-10-28 NOTE — Progress Notes (Signed)
INITIAL NUTRITION ASSESSMENT  DOCUMENTATION CODES Per approved criteria  -Not Applicable   INTERVENTION: - Continue current diet  NUTRITION DIAGNOSIS: Inadequate protein-energy intake related to poor appetite x3 weeks PTA evidenced by pt report.   Goal: Pt to meet 75% estimated needs initially  Monitor:  Meal intakes, weight trends, labs, I/O's  Reason for Assessment: Malnutrition Screening Tool  81 y.o. male  Admitting Dx: CHF  ASSESSMENT: Pt is an 81 year old with hx which includes depression, hernia, kidney stones, HTN, prostate cancer. Pt began on Cymbalta 2-3 weeks ago and since that time has had a poor appetite.  Pt seen for MST and BMI indicates normal weight status. Pt reports poor appetite for about 3 weeks but is unsure what is causing loss of appetite. He denies abdominal pain or nausea. He denies chewing/swallowing difficulties. He continues to state "I am simply not hungry." Pt reports prior to loss of appetite he was eating 3 meals/day without issue. He states that he did not eat anything yesterday or today. Pt reports UBW of 165 lbs (not confirmed by chart review) and is unsure if his weight has changed recently.  Not meeting needs; will monitor for need for supplement as pt unwilling to try anything at this time. Labs and medications reviewed; Na: 134 mmol/L, GFR: 52.  Physical assessment did not show signs of wasting or edema.  Height: Ht Readings from Last 1 Encounters:  10/27/14 5\' 11"  (1.803 m)    Weight: Wt Readings from Last 1 Encounters:  10/28/14 169 lb 15.6 oz (77.1 kg)    Ideal Body Weight: 172 lbs (78.18 kg)  % Ideal Body Weight: 98%  Wt Readings from Last 10 Encounters:  10/28/14 169 lb 15.6 oz (77.1 kg)  10/26/14 172 lb (78.019 kg)  10/15/14 177 lb (80.287 kg)  10/12/14 174 lb (78.926 kg)  08/10/14 176 lb (79.833 kg)  03/30/14 174 lb (78.926 kg)  03/02/14 172 lb (78.019 kg)  11/17/13 169 lb (76.658 kg)  09/01/13 171 lb (77.565 kg)   06/23/13 178 lb (80.74 kg)    Usual Body Weight: pt reports UBW of 165 lbs which is not consistent with above  % Usual Body Weight: unable to calculate  BMI:  Body mass index is 23.72 kg/(m^2).  Estimated Nutritional Needs: Kcal: 1500-1700 Protein: 70-90 grams Fluid: 2L/day  Skin: WDL  Diet Order: Diet Heart Room service appropriate?: Yes; Fluid consistency:: Thin  EDUCATION NEEDS: -No education needs identified at this time   Intake/Output Summary (Last 24 hours) at 10/28/14 1155 Last data filed at 10/28/14 0603  Gross per 24 hour  Intake    300 ml  Output   1575 ml  Net  -1275 ml    Last BM: 4/21   Labs:   Recent Labs Lab 10/27/14 1552 10/28/14 0350  NA 133* 134*  K 4.1 3.5  CL 99 96  CO2 26 29  BUN 17 17  CREATININE 1.25 1.21  CALCIUM 9.1 8.8  GLUCOSE 110* 102*    CBG (last 3)  No results for input(s): GLUCAP in the last 72 hours.  Scheduled Meds: . azithromycin  500 mg Intravenous Q24H  . cefTRIAXone (ROCEPHIN)  IV  1 g Intravenous Q24H  . docusate sodium  300 mg Oral Daily  . DULoxetine  30 mg Oral Daily  . enoxaparin (LOVENOX) injection  40 mg Subcutaneous Q24H  . erythromycin  1 application Both Eyes QHS  . furosemide  40 mg Intravenous Q12H  . gabapentin  1,800 mg Oral QHS  . pantoprazole  40 mg Oral Daily  . polyethylene glycol  17 g Oral Daily  . senna-docusate  3 tablet Oral QHS  . sodium chloride  3 mL Intravenous Q12H  . sodium chloride  3 mL Intravenous Q12H  . tamsulosin  0.4 mg Oral Daily    Continuous Infusions:   Past Medical History  Diagnosis Date  . Depressed   . Hernia   . Kidney stones   . Hypertension   . Prostate cancer   . Cataract 2012    Mohs MD  . Hypertrophy of prostate with urinary obstruction and other lower urinary tract symptoms (LUTS) 08/26/2012  . Edema 08/26/2012  . Ectropion, unspecified 05/27/2012  . Abnormality of gait 11/27/2011  . Unspecified hereditary and idiopathic peripheral neuropathy  08/28/2011  . Basal cell carcinoma of scalp and skin of neck 06/26/2011  . Pain in limb 05/28/2011  . Closed fracture of midcervical section of femur 05/28/2011  . Other and unspecified hyperlipidemia 04/06/2011  . Anemia, unspecified 04/06/2011  . Peripheral vascular disease, unspecified 04/06/2011  . Reflux esophagitis 04/06/2011  . Unspecified constipation 04/06/2011  . Insomnia, unspecified 04/06/2011  . Undiagnosed cardiac murmurs 04/06/2011  . Hypertrophy of prostate with urinary obstruction and other lower urinary tract symptoms (LUTS) 2013  . Depression, acute   . Aortic valve stenosis, rheumatic   . Kidney stones     Past Surgical History  Procedure Laterality Date  . Cholecystectomy  1963/1978  . Hernia repair  2863    umbilical  . Spine surgery  2010    lumbar-L3-4/L4-5  . Fracture surgery Left 03/2011    hip  . Eye surgery      retina repair right  . Esophagogastroduodenoscopy N/A 04/17/2013    Procedure: ESOPHAGOGASTRODUODENOSCOPY (EGD);  Surgeon: Wonda Horner, MD;  Location[facility] ENDOSCOPY;  Service: Endoscopy;  Laterality: N/A;  . Colonoscopy N/A 04/21/2013    Procedure: COLONOSCOPY;  Surgeon: Jeryl Columbia, MD;  Location: [facility] ENDOSCOPY;  Service: Endoscopy;  Laterality: N/A;  . Skin cancer excision  11/10/13    2 lesions removed back of neck Dr. Nevada Crane  . Skin cancer excision  03/23/14    below left ear    Jarome Matin, RD, LDN Inpatient Clinical Dietitian Pager # [phone] After hours/weekend pager # [phone]

## 2014-10-29 DIAGNOSIS — D494 Neoplasm of unspecified behavior of bladder: Secondary | ICD-10-CM | POA: Insufficient documentation

## 2014-10-29 DIAGNOSIS — I350 Nonrheumatic aortic (valve) stenosis: Secondary | ICD-10-CM

## 2014-10-29 DIAGNOSIS — I509 Heart failure, unspecified: Secondary | ICD-10-CM

## 2014-10-29 LAB — CBC
HCT: 27.8 % — ABNORMAL LOW (ref 39.0–52.0)
HEMATOCRIT: 33.4 % — AB (ref 39.0–52.0)
HEMOGLOBIN: 10.4 g/dL — AB (ref 13.0–17.0)
Hemoglobin: 8.7 g/dL — ABNORMAL LOW (ref 13.0–17.0)
MCH: 27.2 pg (ref 26.0–34.0)
MCH: 27.4 pg (ref 26.0–34.0)
MCHC: 31.1 g/dL (ref 30.0–36.0)
MCHC: 31.3 g/dL (ref 30.0–36.0)
MCV: 87.2 fL (ref 78.0–100.0)
MCV: 87.7 fL (ref 78.0–100.0)
PLATELETS: 379 10*3/uL (ref 150–400)
Platelets: 445 10*3/uL — ABNORMAL HIGH (ref 150–400)
RBC: 3.83 MIL/uL — AB (ref 4.22–5.81)
RBC: 3.17 MIL/uL — ABNORMAL LOW (ref 4.22–5.81)
RDW: 18.8 % — ABNORMAL HIGH (ref 11.5–15.5)
RDW: 18.7 % — ABNORMAL HIGH (ref 11.5–15.5)
WBC: 12.4 10*3/uL — ABNORMAL HIGH (ref 4.0–10.5)
WBC: 10.6 10*3/uL — ABNORMAL HIGH (ref 4.0–10.5)

## 2014-10-29 LAB — LEGIONELLA ANTIGEN, URINE

## 2014-10-29 LAB — BASIC METABOLIC PANEL
Anion gap: 9 (ref 5–15)
BUN: 24 mg/dL — ABNORMAL HIGH (ref 6–23)
CO2: 30 mmol/L (ref 19–32)
Calcium: 8.4 mg/dL (ref 8.4–10.5)
Chloride: 94 mmol/L — ABNORMAL LOW (ref 96–112)
Creatinine, Ser: 1.42 mg/dL — ABNORMAL HIGH (ref 0.50–1.35)
GFR calc Af Amer: 50 mL/min — ABNORMAL LOW (ref >90)
GFR calc non Af Amer: 43 mL/min — ABNORMAL LOW (ref >90)
GLUCOSE: 111 mg/dL — AB (ref 70–99)
Potassium: 3.8 mmol/L (ref 3.5–5.1)
SODIUM: 133 mmol/L — AB (ref 135–145)

## 2014-10-29 MED ORDER — FUROSEMIDE 10 MG/ML IJ SOLN
40.0000 mg | Freq: Every day | INTRAMUSCULAR | Status: DC
  Administered 2014-10-30: 40 mg via INTRAVENOUS
  Filled 2014-10-29 (×3): qty 4

## 2014-10-29 NOTE — Evaluation (Signed)
Clinical/Bedside Swallow Evaluation Patient Details  Name: Justin Deleon MRN: [id number] Date of Birth: 1926-01-17  Today's Date: 10/29/2014 Time: SLP Start Time (ACUTE ONLY): 1149 SLP Stop Time (ACUTE ONLY): 1223 SLP Time Calculation (min) (ACUTE ONLY): 34 min  Past Medical History:  Past Medical History  Diagnosis Date  . Depressed   . Hernia   . Kidney stones   . Hypertension   . Prostate cancer   . Cataract 2012    Mohs MD  . Hypertrophy of prostate with urinary obstruction and other lower urinary tract symptoms (LUTS) 08/26/2012  . Edema 08/26/2012  . Ectropion, unspecified 05/27/2012  . Abnormality of gait 11/27/2011  . Unspecified hereditary and idiopathic peripheral neuropathy 08/28/2011  . Basal cell carcinoma of scalp and skin of neck 06/26/2011  . Pain in limb 05/28/2011  . Closed fracture of midcervical section of femur 05/28/2011  . Other and unspecified hyperlipidemia 04/06/2011  . Anemia, unspecified 04/06/2011  . Peripheral vascular disease, unspecified 04/06/2011  . Reflux esophagitis 04/06/2011  . Unspecified constipation 04/06/2011  . Insomnia, unspecified 04/06/2011  . Undiagnosed cardiac murmurs 04/06/2011  . Hypertrophy of prostate with urinary obstruction and other lower urinary tract symptoms (LUTS) 2013  . Depression, acute   . Aortic valve stenosis, rheumatic   . Kidney stones    Past Surgical History:  Past Surgical History  Procedure Laterality Date  . Cholecystectomy  1963/1978  . Hernia repair  0093    umbilical  . Spine surgery  2010    lumbar-L3-4/L4-5  . Fracture surgery Left 03/2011    hip  . Eye surgery      retina repair right  . Esophagogastroduodenoscopy N/A 04/17/2013    Procedure: ESOPHAGOGASTRODUODENOSCOPY (EGD);  Surgeon: Wonda Horner, MD;  Location: [facility] ENDOSCOPY;  Service: Endoscopy;  Laterality: N/A;  . Colonoscopy N/A 04/21/2013    Procedure: COLONOSCOPY;  Surgeon: Jeryl Columbia, MD;  Location: [facility] ENDOSCOPY;  Service: Endoscopy;   Laterality: N/A;  . Skin cancer excision  11/10/13    2 lesions removed back of neck Dr. Nevada Crane  . Skin cancer excision  03/23/14    below left ear   HPI:  Justin Deleon is a 81 y.o. male has a past medical history of Depressed; Hernia; Kidney stones; Hypertension; Prostate cancer; Cataract (2012); Hypertrophy of prostate with urinary obstruction and other lower urinary tract symptoms (LUTS) (08/26/2012); Edema (08/26/2012); Ectropion, unspecified (05/27/2012); Abnormality of gait (11/27/2011); Unspecified hereditary and idiopathic peripheral neuropathy (08/28/2011); Basal cell carcinoma of scalp and skin of neck (06/26/2011); Pain in limb (05/28/2011); Closed fracture of midcervical section of femur (05/28/2011); Other and unspecified hyperlipidemia (04/06/2011); Anemia, unspecified (04/06/2011); Peripheral vascular disease, unspecified (04/06/2011); Reflux esophagitis (04/06/2011); Unspecified constipation (04/06/2011); Insomnia, unspecified (04/06/2011); Undiagnosed cardiac murmurs (04/06/2011); Hypertrophy of prostate with urinary obstruction and other lower urinary tract symptoms (LUTS) (2013); Depression, acute; Aortic valve stenosis, rheumatic; and Kidney stones. Pt presented to emergency department due to 8 out of 10 abdominal pain. Per MD, pt has history of prostate cancer status post prostatectomy and seeds implantation in the past for by Dr. Minus Liberty. Patient was found to be hypoxic on room air down to 88%. She required 4 L of oxygen to correct this. Plain chest x-ray showed evidence of CHF. Hospitalist was called for admission for bilateral pneumonia versus CHF exacerbation a new diagnosis of bladder mass Swallow evaluation ordered.     Assessment / Plan / Recommendation Clinical Impression  Pt presents with negative CN exam.  Observed pt  consuming water, applesauce, crackers, pinto beans, corn and pork.  Slow mastication noted with pt complaining of "being dry" causing him to have problems swallowing.   Small boluses of thin water tolerated well without coughing/indication of airway compromise.  Use of straw and using water to "wash down food" resulted in cough with poor pt awareness.  Advised pt to use applesauce to faciliate oral clearance of solids as needed and use chin tuck with liquids.    Will follow up to assess po tolerance and to evaluate for instrumental evaluation indication.  Educated family members to recommendations.      Aspiration Risk    Moderate   Diet Recommendation Regular;Thin liquid   Liquid Administration via: Cup;No straw Medication Administration:  (prefer with applesauce) Supervision: Patient able to self feed Compensations: Slow rate;Small sips/bites (use applesauce to help oral clearance) Postural Changes and/or Swallow Maneuvers: Chin tuck    Other  Recommendations Oral Care Recommendations: Oral care BID   Follow Up Recommendations   (TBD)    Frequency and Duration min 2x/week  1 week   Pertinent Vitals/Pain Afebrile, crackles      Swallow Study Prior Functional Status  Type of Home: Independent living facility Available Help at Discharge: [place] Date of Onset: 10/29/14 HPI: Justin Deleon is a 81 y.o. male has a past medical history of Depressed; Hernia; Kidney stones; Hypertension; Prostate cancer; Cataract (2012); Hypertrophy of prostate with urinary obstruction and other lower urinary tract symptoms (LUTS) (08/26/2012); Edema (08/26/2012); Ectropion, unspecified (05/27/2012); Abnormality of gait (11/27/2011); Unspecified hereditary and idiopathic peripheral neuropathy (08/28/2011); Basal cell carcinoma of scalp and skin of neck (06/26/2011); Pain in limb (05/28/2011); Closed fracture of midcervical section of femur (05/28/2011); Other and unspecified hyperlipidemia (04/06/2011); Anemia, unspecified (04/06/2011); Peripheral vascular disease, unspecified (04/06/2011); Reflux esophagitis (04/06/2011); Unspecified constipation  (04/06/2011); Insomnia, unspecified (04/06/2011); Undiagnosed cardiac murmurs (04/06/2011); Hypertrophy of prostate with urinary obstruction and other lower urinary tract symptoms (LUTS) (2013); Depression, acute; Aortic valve stenosis, rheumatic; and Kidney stones. Pt presented to emergency department due to 8 out of 10 abdominal pain. Per MD, pt has history of prostate cancer status post prostatectomy and seeds implantation in the past for by Dr. Minus Liberty. Patient was found to be hypoxic on room air down to 88%. She required 4 L of oxygen to correct this. Plain chest x-ray showed evidence of CHF. Hospitalist was called for admission for bilateral pneumonia versus CHF exacerbation a new diagnosis of bladder mass Swallow evaluation ordered.   Type of Study: Bedside swallow evaluation Diet Prior to this Study: Regular;Thin liquids Temperature Spikes Noted: No Respiratory Status: Nasal cannula History of Recent Intubation: No Behavior/Cognition: Alert;Cooperative;Pleasant mood Oral Cavity - Dentition: Adequate natural dentition Self-Feeding Abilities: Able to feed self Patient Positioning: Upright in bed Baseline Vocal Quality: Clear Volitional Cough: Strong Volitional Swallow: Able to elicit    Oral/Motor/Sensory Function Overall Oral Motor/Sensory Function: Appears within functional limits for tasks assessed   Ice Chips Ice chips: Not tested   Thin Liquid Thin Liquid: Impaired Presentation: Cup Pharyngeal  Phase Impairments: Cough - Delayed;Cough - Immediate Other Comments: small single boluses tolerated better with slight chin tuck posture, pt complains of xerostomia    Nectar Thick Nectar Thick Liquid: Not tested   Honey Thick Honey Thick Liquid: Not tested   Puree Puree: Within functional limits Presentation: Self Fed;Spoon   Solid   GO    Solid: Impaired Presentation: Self Fed Oral Phase Impairments: Reduced lingual movement/coordination;Impaired anterior to  posterior transit Oral  Phase Functional Implications: Oral residue Other Comments: use of applesauce aided clearance, consuming water after solids resulted in overt coughing with poor pt awareness       Luanna Salk, [place] [facility]

## 2014-10-29 NOTE — Progress Notes (Signed)
Echocardiogram 2D Echocardiogram has been performed.  Justin Deleon 10/29/2014, 1:28 PM

## 2014-10-29 NOTE — Progress Notes (Signed)
Pt had a large melanous stool. VSS, notified NP, will continue to monitor. Fara Olden P

## 2014-10-29 NOTE — Progress Notes (Addendum)
[facility] PROGRESS NOTE  Justin Deleon EYC:[id number] DOB: 1925-10-16 DOA: 10/27/2014  PCP: Estill Dooms, MD  Brief HPI: 81 year old Caucasian male with a past medical history that is quite significant and mentioned below, presented with generalized weakness. He was found to be short of breath. He was complaining of abdominal pain. Chest x-ray suggested congestive heart failure versus pneumonia. He was admitted for further management.  Past medical history:  Past Medical History  Diagnosis Date  . Depressed   . Hernia   . Kidney stones   . Hypertension   . Prostate cancer   . Cataract 2012    Mohs MD  . Hypertrophy of prostate with urinary obstruction and other lower urinary tract symptoms (LUTS) 08/26/2012  . Edema 08/26/2012  . Ectropion, unspecified 05/27/2012  . Abnormality of gait 11/27/2011  . Unspecified hereditary and idiopathic peripheral neuropathy 08/28/2011  . Basal cell carcinoma of scalp and skin of neck 06/26/2011  . Pain in limb 05/28/2011  . Closed fracture of midcervical section of femur 05/28/2011  . Other and unspecified hyperlipidemia 04/06/2011  . Anemia, unspecified 04/06/2011  . Peripheral vascular disease, unspecified 04/06/2011  . Reflux esophagitis 04/06/2011  . Unspecified constipation 04/06/2011  . Insomnia, unspecified 04/06/2011  . Undiagnosed cardiac murmurs 04/06/2011  . Hypertrophy of prostate with urinary obstruction and other lower urinary tract symptoms (LUTS) 2013  . Depression, acute   . Aortic valve stenosis, rheumatic   . Kidney stones     Consultants: Cardiology, urology over the phone  Procedures: 2-D echocardiogram is pending  Antibiotics: Ceftriaxone and azithromycin 4/20  Subjective: Patient continues to have upper abdominalpain, but less so than yesterday. Denies any shortness of breath. Denies any chest pain.  Objective: Vital Signs  Filed Vitals:   10/28/14 1404 10/28/14 2022 10/29/14 0500 10/29/14 0600  BP:  117/71 105/61  113/70  Pulse: 104 83    Temp: 98.1 F (36.7 C) 98.2 F (36.8 C)  97.8 F (36.6 C)  TempSrc: Oral Oral    Resp: 16 16  16   Height:      Weight:   74.7 kg (164 lb 10.9 oz)   SpO2: 93% 92%  94%    Intake/Output Summary (Last 24 hours) at 10/29/14 0831 Last data filed at 10/29/14 0615  Gross per 24 hour  Intake   1080 ml  Output    903 ml  Net    177 ml   Filed Weights   10/27/14 2100 10/28/14 0527 10/29/14 0500  Weight: 77.2 kg (170 lb 3.1 oz) 77.1 kg (169 lb 15.6 oz) 74.7 kg (164 lb 10.9 oz)    General appearance: alert, cooperative, appears to be more comfortable today. In no distress. Resp: improved air entry bilaterally. Less crackles. No wheezing. Cardio: regular rate and rhythm, S1, S2 normal, no murmur, click, rub or gallop GI: soft, non-tender; bowel sounds normal; no masses,  no organomegaly Extremities: extremities normal, atraumatic, no cyanosis or edema Neurologic: Alert. Somewhat distracted. Oriented to person, place. No obvious neurological deficits noted.  Lab Results:  Basic Metabolic Panel:  Recent Labs Lab 10/27/14 1552 10/28/14 0350 10/29/14 0713  NA 133* 134* 133*  K 4.1 3.5 3.8  CL 99 96 94*  CO2 26 29 30   GLUCOSE 110* 102* 111*  BUN 17 17 24*  CREATININE 1.25 1.21 1.42*  CALCIUM 9.1 8.8 8.4   Liver Function Tests:  Recent Labs Lab 10/27/14 1552 10/28/14 0350  AST 19 17  ALT 13 11  ALKPHOS 63 56  BILITOT 0.8 0.4  PROT 6.5 5.7*  ALBUMIN 3.8 3.3*    Recent Labs Lab 10/27/14 1552  LIPASE 26   CBC:  Recent Labs Lab 10/27/14 1552 10/29/14 0713  WBC 9.7 12.4*  NEUTROABS 7.3  --   HGB 10.5* 10.4*  HCT 33.4* 33.4*  MCV 86.8 87.2  PLT 421* 445*   Cardiac Enzymes:  Recent Labs Lab 10/27/14 2116 10/28/14 0254 10/28/14 0948  TROPONINI 0.15* 0.34* 0.19*   BNP (last 3 results)  Recent Labs  10/27/14 1557 10/28/14 0320  BNP 859.9* 1327.8*    Studies/Results: Dg Chest 2 View  10/27/2014   CLINICAL  DATA:  Chest pain and short of breath  EXAM: CHEST  2 VIEW  COMPARISON:  03/19/2012.  FINDINGS: Bilateral airspace disease, perihilar predominance. Small bilateral pleural effusions with bibasilar atelectasis. Cardiac enlargement. Previously the lungs are clear.  IMPRESSION: Congestive heart failure with edema and small effusions.   Electronically Signed   By: Franchot Gallo M.D.   On: 10/27/2014 16:30   Ct Abdomen Pelvis W Contrast  10/27/2014   CLINICAL DATA:  Lower abdominal pain for 3 weeks  EXAM: CT ABDOMEN AND PELVIS WITH CONTRAST  TECHNIQUE: Multidetector CT imaging of the abdomen and pelvis was performed using the standard protocol following bolus administration of intravenous contrast.  CONTRAST:  92mL OMNIPAQUE IOHEXOL 300 MG/ML SOLN, 165mL OMNIPAQUE IOHEXOL 300 MG/ML SOLN  COMPARISON:  None.  FINDINGS: Bilateral pleural effusions are noted with bibasilar infiltrate.  The gallbladder has been surgically removed. The liver, spleen and pancreas are within normal limits. The adrenal glands are enlarged bilaterally likely related hypertrophy. No definitive mass lesion is seen.  The kidneys are well visualized bilaterally and demonstrate bilateral renal cystic change. This is much worse on the left than the right with at least 2 dominant cysts. These measure 8.1 cm and 10.8 cm. Delayed images demonstrate normal excretion of contrast material. Aortoiliac calcifications are seen.  Small anterior abdominal wall hernia is noted adjacent to the umbilicus containing fat. No herniation of bowel loops within is noted. The prostate shows multiple brachytherapy seeds. The bladder is partially distended. A irregular enhancing lesion is noted arising from the inferior aspect of the urinary bladder. This is consistent with a bladder neoplasm until proven otherwise. No significant pelvic lymphadenopathy is identified.  The bony structures show changes of prior left hip pinning. Degenerative changes of the lumbar spine are  seen.  IMPRESSION: Changes suggestive of bladder mass. Direct visualization is recommended.  Bilateral lower lobe infiltrates with associated effusions.  Bilateral renal cystic change.  Fat containing periumbilical hernia.   Electronically Signed   By: Inez Catalina M.D.   On: 10/27/2014 17:49    Medications:  Scheduled: . azithromycin  500 mg Intravenous Q24H  . cefTRIAXone (ROCEPHIN)  IV  1 g Intravenous Q24H  . docusate sodium  300 mg Oral Daily  . DULoxetine  30 mg Oral Daily  . erythromycin  1 application Both Eyes QHS  . [START ON 10/30/2014] furosemide  40 mg Intravenous Daily  . gabapentin  1,800 mg Oral QHS  . pantoprazole  40 mg Oral Daily  . polyethylene glycol  17 g Oral Daily  . senna-docusate  3 tablet Oral QHS  . sodium chloride  3 mL Intravenous Q12H  . sodium chloride  3 mL Intravenous Q12H  . tamsulosin  0.4 mg Oral Daily   Continuous:  QMV:HQIONG chloride, sodium chloride, acetaminophen, morphine injection, ondansetron (ZOFRAN) IV,  oxyCODONE-acetaminophen, sodium chloride, sodium chloride, zolpidem  Assessment/Plan:  Active Problems:   Calcific aortic stenosis   Anemia   CHF (congestive heart failure)   Abnormal CT of the abdomen   Hypoxia   CAP (community acquired pneumonia)   Acute respiratory failure with hypoxia   Elevated troponin   Normocytic anemia    Acute respiratory failure with Hypoxia Most likely secondary to acute congestive heart failure. Type is unknown. Echocardiogram is pending. Last echocardiogram is from 2014, which revealed normal systolic function. His history of aortic stenosis is likely contributing. Pneumonia was also in the differential and the patient was started on antibiotics. Continue with IV Lasix for now. Oxygen to maintain sats greater than 90%.  Questionable melena RN reported melanotic stools overnight. Hemoglobin has remained stable. Continue to monitor for now. RN to report back to me if he has more bowel movements. We will  repeat another CBC this afternoon.  Likely acute congestive heart failure of unknown type with severe AS Echocardiogram is pending. Cardiology is following. Cut back on the dose of Lasix. Patient has aortic stenosis which is likely contributing. Echocardiogram will give more information.  Minimally elevated troponin EKG shows a nonspecific ST changes. Patient not very clear about the chest pain. This is most likely demand ischemia. We await echocardiogram. Continue aspirin. Do not anticipate any kind of invasive workup.  Questionable community-acquired pneumonia Continue with antibiotics for now.  Possible urinary bladder mass Discussed with urology at the time of admission. They recommended outpatient management. Continue Foley catheter. Patient has a remote history of prostate cancer.  Normocytic anemia Stable. Continue to monitor.  ADDENDUM RN reported bloody BM at around 11Am today. She couldn't see the whole stool as it was flushed but there were some blood streaks on the bedpan. Hgb dropped to 8.7. No further episodes of bleeding. Will continue to monitor for now. Lovenox stopped. SCD's.  DVT Prophylaxis: Lovenox    Code Status: DO NOT RESUSCITATE  Family Communication: Discussed with the patient's son and daughter  Disposition Plan: Not ready for discharge. Await echocardiogram. PT and OT to evaluate. Family understands patient's poor long-term prognosis. They are agreeable to a palliative medicine meeting to address goals of care. We will consult the same.     LOS: 2 days   [facility] Pager [phone] 10/29/2014, 8:31 AM  If 7PM-7AM, please contact night-coverage at [email or web address], password [facility]

## 2014-10-29 NOTE — Evaluation (Signed)
Physical Therapy Evaluation Patient Details Name: Justin Deleon MRN: [id number] DOB: [birth year] Today's Date: 10/29/2014   History of Present Illness  81 y.o. male with h/o dementia, peripheral neuropathy, PVD, HTN, prostate cancer, femur fx 2012 admitted with acute on chronic CHF, severe aortic stenosis, and a bladder mass.   Clinical Impression  Pt admitted with above diagnosis. Pt currently with functional limitations due to the deficits listed below (see PT Problem List). Min assist to pivot to [facility] then to recliner using RW. Ambulation deferred due to active rectal bleeding. At baseline pt is independent with mobility. He may need SNF level of care upon DC depending on progress. Pt will benefit from skilled PT to increase their independence and safety with mobility to allow discharge to the venue listed below.       Follow Up Recommendations SNF;Supervision for mobility/OOB (SNF vs HHPT at ILF depending on progress/medical issues)    Equipment Recommendations  None recommended by PT    Recommendations for Other Services OT consult     Precautions / Restrictions Precautions Precautions: Fall Precaution Comments: active rectal bleeding Restrictions Weight Bearing Restrictions: No      Mobility  Bed Mobility Overal bed mobility: Needs Assistance Bed Mobility: Supine to Sit     Supine to sit: Min assist     General bed mobility comments: assist to raise trunk  Transfers Overall transfer level: Needs assistance Equipment used: Rolling walker (2 wheeled) Transfers: Sit to/from [facility] Sit to Stand: Min assist Stand pivot transfers: Min assist       General transfer comment: cues for hand placement, min A to rise and for safety, SPT with RW x 2 trials, did not ambulate due to active rectal bleeding  Ambulation/Gait                Stairs            Wheelchair Mobility    Modified Rankin (Stroke Patients Only)       Balance  Overall balance assessment: Needs assistance   Sitting balance-Leahy Scale: Good       Standing balance-Leahy Scale: Fair                               Pertinent Vitals/Pain Pain Assessment: No/denies pain    Home Living Family/patient expects to be discharged to:: Private residence Living Arrangements: Alone Available Help at Discharge: [place] Type of Home: Independent living facility Home Access: Level entry     Home Layout: One level Home Equipment: Environmental consultant - 4 wheels Additional Comments: from [place]    Prior Function Level of Independence: Independent with assistive device(s)         Comments: walks to dining room with rollator, exercises at [employer]        Extremity/Trunk Assessment   Upper Extremity Assessment: Defer to OT evaluation           Lower Extremity Assessment: Overall WFL for tasks assessed      Cervical / Trunk Assessment: Kyphotic  Communication   Communication: No difficulties  Cognition Arousal/Alertness: Awake/alert Behavior During Therapy: WFL for tasks assessed/performed Overall Cognitive Status: Within Functional Limits for tasks assessed                      General Comments      Exercises  Assessment/Plan    PT Assessment Patient needs continued PT services  PT Diagnosis Generalized weakness;Difficulty walking   PT Problem List Decreased activity tolerance;Decreased mobility  PT Treatment Interventions Gait training;Therapeutic activities;Therapeutic exercise;Balance training   PT Goals (Current goals can be found in the Care Plan section) Acute Rehab PT Goals Patient Stated Goal: return to walking independently with rollator PT Goal Formulation: With patient Time For Goal Achievement: 11/12/14 Potential to Achieve Goals: Good    Frequency Min 3X/week   Barriers to discharge        Co-evaluation               End of Session  Equipment Utilized During Treatment: Oxygen Activity Tolerance: Treatment limited secondary to medical complications (Comment) (active lower GIB) Patient left: in chair;with call bell/phone within reach;with family/visitor present Nurse Communication: Mobility status         Time: 8101-7510 PT Time Calculation (min) (ACUTE ONLY): 25 min   Charges:   PT Evaluation $Initial PT Evaluation Tier I: 1 Procedure PT Treatments $Therapeutic Activity: 8-22 mins   PT G Codes:        Philomena Doheny 10/29/2014, 10:59 AM [phone]

## 2014-10-29 NOTE — Progress Notes (Signed)
Patient Name: Justin Deleon Date of Encounter: 10/29/2014     Active Problems:   Calcific aortic stenosis   Anemia   CHF (congestive heart failure)   Abnormal CT of the abdomen   Hypoxia   CAP (community acquired pneumonia)   Acute respiratory failure with hypoxia   Elevated troponin   Normocytic anemia    SUBJECTIVE  Patient denies chest discomfort or shortness of breath this morning.  Remains confused.  He does know that he is in a hospital however.  CURRENT MEDS . azithromycin  500 mg Intravenous Q24H  . cefTRIAXone (ROCEPHIN)  IV  1 g Intravenous Q24H  . docusate sodium  300 mg Oral Daily  . DULoxetine  30 mg Oral Daily  . erythromycin  1 application Both Eyes QHS  . furosemide  40 mg Intravenous Q12H  . gabapentin  1,800 mg Oral QHS  . pantoprazole  40 mg Oral Daily  . polyethylene glycol  17 g Oral Daily  . senna-docusate  3 tablet Oral QHS  . sodium chloride  3 mL Intravenous Q12H  . sodium chloride  3 mL Intravenous Q12H  . tamsulosin  0.4 mg Oral Daily    OBJECTIVE  Filed Vitals:   10/28/14 1404 10/28/14 2022 10/29/14 0500 10/29/14 0600  BP: 117/71 105/61  113/70  Pulse: 104 83    Temp: 98.1 F (36.7 C) 98.2 F (36.8 C)  97.8 F (36.6 C)  TempSrc: Oral Oral    Resp: 16 16  16   Height:      Weight:   164 lb 10.9 oz (74.7 kg)   SpO2: 93% 92%  94%    Intake/Output Summary (Last 24 hours) at 10/29/14 0808 Last data filed at 10/29/14 0615  Gross per 24 hour  Intake   1080 ml  Output    903 ml  Net    177 ml   Filed Weights   10/27/14 2100 10/28/14 0527 10/29/14 0500  Weight: 170 lb 3.1 oz (77.2 kg) 169 lb 15.6 oz (77.1 kg) 164 lb 10.9 oz (74.7 kg)    PHYSICAL EXAM  General: Pleasant, NAD.  Confused  Lungs:  Resp regular and unlabored, CTA. Heart: Regular sinus rhythm.  Grade 3/6 harsh systolic ejection murmur of aortic stenosis widely heard across precordium. Abdomen: Soft, non-tender, non-distended, BS + x 4.  Extremities: No  clubbing, cyanosis or edema. DP/PT/Radials 2+ and equal bilaterally.  Accessory Clinical Findings  CBC  Recent Labs  10/27/14 1552 10/29/14 0713  WBC 9.7 12.4*  NEUTROABS 7.3  --   HGB 10.5* 10.4*  HCT 33.4* 33.4*  MCV 86.8 87.2  PLT 421* 767*   Basic Metabolic Panel  Recent Labs  10/27/14 1552 10/28/14 0350  NA 133* 134*  K 4.1 3.5  CL 99 96  CO2 26 29  GLUCOSE 110* 102*  BUN 17 17  CREATININE 1.25 1.21  CALCIUM 9.1 8.8   Liver Function Tests  Recent Labs  10/27/14 1552 10/28/14 0350  AST 19 17  ALT 13 11  ALKPHOS 63 56  BILITOT 0.8 0.4  PROT 6.5 5.7*  ALBUMIN 3.8 3.3*    Recent Labs  10/27/14 1552  LIPASE 26   Cardiac Enzymes  Recent Labs  10/27/14 2116 10/28/14 0254 10/28/14 0948  TROPONINI 0.15* 0.34* 0.19*   BNP Invalid input(s): POCBNP D-Dimer No results for input(s): DDIMER in the last 72 hours. Hemoglobin A1C No results for input(s): HGBA1C in the last 72 hours. Fasting Lipid Panel No  results for input(s): CHOL, HDL, LDLCALC, TRIG, CHOLHDL, LDLDIRECT in the last 72 hours. Thyroid Function Tests  Recent Labs  10/28/14 0320  TSH 1.283    TELE  Normal sinus rhythm  ECG    Radiology/Studies  Dg Chest 2 View  10/27/2014   CLINICAL DATA:  Chest pain and short of breath  EXAM: CHEST  2 VIEW  COMPARISON:  03/19/2012.  FINDINGS: Bilateral airspace disease, perihilar predominance. Small bilateral pleural effusions with bibasilar atelectasis. Cardiac enlargement. Previously the lungs are clear.  IMPRESSION: Congestive heart failure with edema and small effusions.   Electronically Signed   By: Franchot Gallo M.D.   On: 10/27/2014 16:30   Ct Abdomen Pelvis W Contrast  10/27/2014   CLINICAL DATA:  Lower abdominal pain for 3 weeks  EXAM: CT ABDOMEN AND PELVIS WITH CONTRAST  TECHNIQUE: Multidetector CT imaging of the abdomen and pelvis was performed using the standard protocol following bolus administration of intravenous contrast.   CONTRAST:  57mL OMNIPAQUE IOHEXOL 300 MG/ML SOLN, 121mL OMNIPAQUE IOHEXOL 300 MG/ML SOLN  COMPARISON:  None.  FINDINGS: Bilateral pleural effusions are noted with bibasilar infiltrate.  The gallbladder has been surgically removed. The liver, spleen and pancreas are within normal limits. The adrenal glands are enlarged bilaterally likely related hypertrophy. No definitive mass lesion is seen.  The kidneys are well visualized bilaterally and demonstrate bilateral renal cystic change. This is much worse on the left than the right with at least 2 dominant cysts. These measure 8.1 cm and 10.8 cm. Delayed images demonstrate normal excretion of contrast material. Aortoiliac calcifications are seen.  Small anterior abdominal wall hernia is noted adjacent to the umbilicus containing fat. No herniation of bowel loops within is noted. The prostate shows multiple brachytherapy seeds. The bladder is partially distended. A irregular enhancing lesion is noted arising from the inferior aspect of the urinary bladder. This is consistent with a bladder neoplasm until proven otherwise. No significant pelvic lymphadenopathy is identified.  The bony structures show changes of prior left hip pinning. Degenerative changes of the lumbar spine are seen.  IMPRESSION: Changes suggestive of bladder mass. Direct visualization is recommended.  Bilateral lower lobe infiltrates with associated effusions.  Bilateral renal cystic change.  Fat containing periumbilical hernia.   Electronically Signed   By: Inez Catalina M.D.   On: 10/27/2014 17:49    ASSESSMENT AND PLAN 1.  Acute on chronic diastolic heart failure. 2.  Severe aortic stenosis.  Not a surgical candidate for surgery or TAVR. 3.  Severe dementia 4.  Bladder mass possible cancer  Plan: Await results of 2-D echo.  Conservative management.  Try to maintain quality of life.   Signed, Darlin Coco MD

## 2014-10-29 NOTE — Progress Notes (Signed)
CSW reviewed PT evaluation recommending SNF at discharge. Patient is from [place] at [facility]. [place] confirmed with Narda Rutherford at [facility] that they would have a bed available for patient at discharge. Patient's son, Jenny Reichmann informed CSW they are awaiting Palliative Care Consult.   CSW will follow-up.      Raynaldo Opitz, [facility] Clinical Social Worker cell #: [phone]

## 2014-10-29 NOTE — Progress Notes (Signed)
OT Cancellation Note  Patient Details Name: Justin Deleon MRN: [id number] DOB: January 26, 1926   Cancelled Treatment:      Spoke with pt and family. Explained role of OT.  Decided best [facility].  Kari Baars, [place]  Payton Mccallum D 10/29/2014, 10:56 AM

## 2014-10-30 DIAGNOSIS — K921 Melena: Secondary | ICD-10-CM | POA: Insufficient documentation

## 2014-10-30 DIAGNOSIS — D62 Acute posthemorrhagic anemia: Secondary | ICD-10-CM | POA: Insufficient documentation

## 2014-10-30 DIAGNOSIS — I5021 Acute systolic (congestive) heart failure: Principal | ICD-10-CM

## 2014-10-30 LAB — BASIC METABOLIC PANEL
Anion gap: 7 (ref 5–15)
BUN: 27 mg/dL — AB (ref 6–23)
CALCIUM: 7.8 mg/dL — AB (ref 8.4–10.5)
CHLORIDE: 94 mmol/L — AB (ref 96–112)
CO2: 32 mmol/L (ref 19–32)
CREATININE: 1.42 mg/dL — AB (ref 0.50–1.35)
GFR calc Af Amer: 50 mL/min — ABNORMAL LOW (ref >90)
GFR, EST NON AFRICAN AMERICAN: 43 mL/min — AB (ref >90)
GLUCOSE: 105 mg/dL — AB (ref 70–99)
Potassium: 3.4 mmol/L — ABNORMAL LOW (ref 3.5–5.1)
Sodium: 133 mmol/L — ABNORMAL LOW (ref 135–145)

## 2014-10-30 LAB — CBC
HCT: 23.9 % — ABNORMAL LOW (ref 39.0–52.0)
HCT: 29.3 % — ABNORMAL LOW (ref 39.0–52.0)
Hemoglobin: 7.5 g/dL — ABNORMAL LOW (ref 13.0–17.0)
Hemoglobin: 9.3 g/dL — ABNORMAL LOW (ref 13.0–17.0)
MCH: 27.4 pg (ref 26.0–34.0)
MCH: 27.4 pg (ref 26.0–34.0)
MCHC: 31.4 g/dL (ref 30.0–36.0)
MCHC: 31.7 g/dL (ref 30.0–36.0)
MCV: 87.2 fL (ref 78.0–100.0)
MCV: 86.4 fL (ref 78.0–100.0)
PLATELETS: 340 10*3/uL (ref 150–400)
Platelets: 334 10*3/uL (ref 150–400)
RBC: 2.74 MIL/uL — ABNORMAL LOW (ref 4.22–5.81)
RBC: 3.39 MIL/uL — ABNORMAL LOW (ref 4.22–5.81)
RDW: 18.7 % — AB (ref 11.5–15.5)
RDW: 17.2 % — AB (ref 11.5–15.5)
WBC: 12.8 10*3/uL — AB (ref 4.0–10.5)
WBC: 8.8 10*3/uL (ref 4.0–10.5)

## 2014-10-30 LAB — PREPARE RBC (CROSSMATCH)

## 2014-10-30 LAB — ABO/RH: ABO/RH(D): O POS

## 2014-10-30 MED ORDER — POTASSIUM CHLORIDE CRYS ER 20 MEQ PO TBCR
40.0000 meq | EXTENDED_RELEASE_TABLET | Freq: Once | ORAL | Status: AC
  Administered 2014-10-30: 40 meq via ORAL
  Filled 2014-10-30: qty 2

## 2014-10-30 MED ORDER — SODIUM CHLORIDE 0.9 % IV SOLN: Freq: Once | INTRAVENOUS | Status: AC

## 2014-10-30 MED ORDER — FUROSEMIDE 10 MG/ML IJ SOLN
20.0000 mg | Freq: Once | INTRAMUSCULAR | Status: AC
  Administered 2014-10-30: 20 mg via INTRAVENOUS
  Filled 2014-10-30: qty 2

## 2014-10-30 MED ORDER — FUROSEMIDE 10 MG/ML IJ SOLN
20.0000 mg | Freq: Once | INTRAMUSCULAR | Status: AC
  Administered 2014-10-30: 20 mg via INTRAVENOUS

## 2014-10-30 MED ORDER — ALPRAZOLAM 0.5 MG PO TABS
0.5000 mg | ORAL_TABLET | Freq: Three times a day (TID) | ORAL | Status: DC | PRN
  Administered 2014-10-30 – 2014-11-01 (×4): 0.5 mg via ORAL
  Filled 2014-10-30 (×4): qty 1

## 2014-10-30 NOTE — Progress Notes (Signed)
Patient Name: Justin Deleon Date of Encounter: 10/30/2014     Active Problems:   Calcific aortic stenosis   Anemia   CHF (congestive heart failure)   Abnormal CT of the abdomen   Hypoxia   CAP (community acquired pneumonia)   Acute respiratory failure with hypoxia   Elevated troponin   Normocytic anemia   Bladder neoplasm    SUBJECTIVE  Patient denies chest discomfort or shortness of breath this morning.  He has developed hematochezia.  Hemoglobin has dropped overnight. His echocardiogram yesterday shows poor left ventricular function in the face of severe aortic stenosis.  CURRENT MEDS . sodium chloride   Intravenous Once  . azithromycin  500 mg Intravenous Q24H  . cefTRIAXone (ROCEPHIN)  IV  1 g Intravenous Q24H  . docusate sodium  300 mg Oral Daily  . DULoxetine  30 mg Oral Daily  . erythromycin  1 application Both Eyes QHS  . furosemide  20 mg Intravenous Once  . furosemide  20 mg Intravenous Once  . furosemide  40 mg Intravenous Daily  . gabapentin  1,800 mg Oral QHS  . pantoprazole  40 mg Oral Daily  . polyethylene glycol  17 g Oral Daily  . senna-docusate  3 tablet Oral QHS  . sodium chloride  3 mL Intravenous Q12H  . sodium chloride  3 mL Intravenous Q12H  . tamsulosin  0.4 mg Oral Daily    OBJECTIVE  Filed Vitals:   10/29/14 2028 10/30/14 0200 10/30/14 0500 10/30/14 0527  BP: 86/46 106/66  106/63  Pulse: 91 87  82  Temp: 97.7 F (36.5 C)   97.9 F (36.6 C)  TempSrc: Oral   Oral  Resp: 18   16  Height:      Weight:   165 lb 9.1 oz (75.1 kg)   SpO2: 98%   97%    Intake/Output Summary (Last 24 hours) at 10/30/14 0810 Last data filed at 10/30/14 0600  Gross per 24 hour  Intake   1390 ml  Output   1326 ml  Net     64 ml   Filed Weights   10/28/14 0527 10/29/14 0500 10/30/14 0500  Weight: 169 lb 15.6 oz (77.1 kg) 164 lb 10.9 oz (74.7 kg) 165 lb 9.1 oz (75.1 kg)    PHYSICAL EXAM  General: Pleasant, NAD.  Confused  Lungs:  Resp regular  and unlabored, CTA. Heart: Regular sinus rhythm.  Grade 3/6 harsh systolic ejection murmur of aortic stenosis widely heard across precordium. Abdomen: Soft, non-tender, non-distended, BS + x 4.  Extremities: No clubbing, cyanosis or edema. DP/PT/Radials 2+ and equal bilaterally.  Accessory Clinical Findings  CBC  Recent Labs  10/27/14 1552  10/29/14 1653 10/30/14 0431  WBC 9.7  < > 10.6* 12.8*  NEUTROABS 7.3  --   --   --   HGB 10.5*  < > 8.7* 7.5*  HCT 33.4*  < > 27.8* 23.9*  MCV 86.8  < > 87.7 87.2  PLT 421*  < > 379 340  < > = values in this interval not displayed. Basic Metabolic Panel  Recent Labs  10/29/14 0713 10/30/14 0431  NA 133* 133*  K 3.8 3.4*  CL 94* 94*  CO2 30 32  GLUCOSE 111* 105*  BUN 24* 27*  CREATININE 1.42* 1.42*  CALCIUM 8.4 7.8*   Liver Function Tests  Recent Labs  10/27/14 1552 10/28/14 0350  AST 19 17  ALT 13 11  ALKPHOS 63 56  BILITOT  0.8 0.4  PROT 6.5 5.7*  ALBUMIN 3.8 3.3*    Recent Labs  10/27/14 1552  LIPASE 26   Cardiac Enzymes  Recent Labs  10/27/14 2116 10/28/14 0254 10/28/14 0948  TROPONINI 0.15* 0.34* 0.19*   BNP Invalid input(s): POCBNP D-Dimer No results for input(s): DDIMER in the last 72 hours. Hemoglobin A1C No results for input(s): HGBA1C in the last 72 hours. Fasting Lipid Panel No results for input(s): CHOL, HDL, LDLCALC, TRIG, CHOLHDL, LDLDIRECT in the last 72 hours. Thyroid Function Tests  Recent Labs  10/28/14 0320  TSH 1.283    TELE  Normal sinus rhythm  ECG    Radiology/Studies  Dg Chest 2 View  10/27/2014   CLINICAL DATA:  Chest pain and short of breath  EXAM: CHEST  2 VIEW  COMPARISON:  03/19/2012.  FINDINGS: Bilateral airspace disease, perihilar predominance. Small bilateral pleural effusions with bibasilar atelectasis. Cardiac enlargement. Previously the lungs are clear.  IMPRESSION: Congestive heart failure with edema and small effusions.   Electronically Signed   By:  Franchot Gallo M.D.   On: 10/27/2014 16:30   Ct Abdomen Pelvis W Contrast  10/27/2014   CLINICAL DATA:  Lower abdominal pain for 3 weeks  EXAM: CT ABDOMEN AND PELVIS WITH CONTRAST  TECHNIQUE: Multidetector CT imaging of the abdomen and pelvis was performed using the standard protocol following bolus administration of intravenous contrast.  CONTRAST:  75mL OMNIPAQUE IOHEXOL 300 MG/ML SOLN, 167mL OMNIPAQUE IOHEXOL 300 MG/ML SOLN  COMPARISON:  None.  FINDINGS: Bilateral pleural effusions are noted with bibasilar infiltrate.  The gallbladder has been surgically removed. The liver, spleen and pancreas are within normal limits. The adrenal glands are enlarged bilaterally likely related hypertrophy. No definitive mass lesion is seen.  The kidneys are well visualized bilaterally and demonstrate bilateral renal cystic change. This is much worse on the left than the right with at least 2 dominant cysts. These measure 8.1 cm and 10.8 cm. Delayed images demonstrate normal excretion of contrast material. Aortoiliac calcifications are seen.  Small anterior abdominal wall hernia is noted adjacent to the umbilicus containing fat. No herniation of bowel loops within is noted. The prostate shows multiple brachytherapy seeds. The bladder is partially distended. A irregular enhancing lesion is noted arising from the inferior aspect of the urinary bladder. This is consistent with a bladder neoplasm until proven otherwise. No significant pelvic lymphadenopathy is identified.  The bony structures show changes of prior left hip pinning. Degenerative changes of the lumbar spine are seen.  IMPRESSION: Changes suggestive of bladder mass. Direct visualization is recommended.  Bilateral lower lobe infiltrates with associated effusions.  Bilateral renal cystic change.  Fat containing periumbilical hernia.   Electronically Signed   By: Inez Catalina M.D.   On: 10/27/2014 17:49    ASSESSMENT AND PLAN 1.  Acute on chronic diastolic heart  failure. 2.  Severe aortic stenosis.  Not a surgical candidate for surgery or TAVR. 3.  Severe dementia 4.  Bladder mass possible cancer 5.  Hematochezia.  History of diverticulosis.  Plan:   Continue conservative management.  Palliative care consult pending.   Signed, Darlin Coco MD

## 2014-10-30 NOTE — Progress Notes (Addendum)
Pharmacist Heart Failure Core Measure Documentation  Assessment: Justin Deleon has an EF documented as 25-30% on 10/29/14  by ECHO.  Rationale: Heart failure patients with left ventricular systolic dysfunction (LVSD) and an EF < 40% should be prescribed an angiotensin converting enzyme inhibitor (ACEI) or angiotensin receptor blocker (ARB) at discharge unless a contraindication is documented in the medical record.  This patient is not currently on an ACEI or ARB for HF.  This note is being placed in the record in order to provide documentation that a contraindication to the use of these agents is present for this encounter.  ACE Inhibitor or Angiotensin Receptor Blocker is contraindicated (specify all that apply)  []   ACEI allergy AND ARB allergy []   Angioedema [x]   Moderate or severe aortic stenosis []   Hyperkalemia [x]   Hypotension []   Renal artery stenosis [x]   Worsening renal function, preexisting renal disease or dysfunction  Doreene Eland, PharmD, BCPS.   Pager: [phone]  10/30/2014 2:22 PM

## 2014-10-30 NOTE — Progress Notes (Signed)
[facility] PROGRESS NOTE  ZARIF RATHJE GUR:[id number] DOB: 1925/10/03 DOA: 10/27/2014  PCP: Estill Dooms, MD  Brief HPI: 81 year old Caucasian male with a past medical history that is quite significant and mentioned below, presented with generalized weakness. He was found to be short of breath. He was complaining of abdominal pain. Chest x-ray suggested congestive heart failure versus pneumonia. He was admitted for further management.  Past medical history:  Past Medical History  Diagnosis Date  . Depressed   . Hernia   . Kidney stones   . Hypertension   . Prostate cancer   . Cataract 2012    Mohs MD  . Hypertrophy of prostate with urinary obstruction and other lower urinary tract symptoms (LUTS) 08/26/2012  . Edema 08/26/2012  . Ectropion, unspecified 05/27/2012  . Abnormality of gait 11/27/2011  . Unspecified hereditary and idiopathic peripheral neuropathy 08/28/2011  . Basal cell carcinoma of scalp and skin of neck 06/26/2011  . Pain in limb 05/28/2011  . Closed fracture of midcervical section of femur 05/28/2011  . Other and unspecified hyperlipidemia 04/06/2011  . Anemia, unspecified 04/06/2011  . Peripheral vascular disease, unspecified 04/06/2011  . Reflux esophagitis 04/06/2011  . Unspecified constipation 04/06/2011  . Insomnia, unspecified 04/06/2011  . Undiagnosed cardiac murmurs 04/06/2011  . Hypertrophy of prostate with urinary obstruction and other lower urinary tract symptoms (LUTS) 2013  . Depression, acute   . Aortic valve stenosis, rheumatic   . Kidney stones     Consultants: Cardiology, urology over the phone  Procedures:  2-D echocardiogram Study Conclusions - Left ventricle: The cavity size was normal. Wall thickness wasincreased in a pattern of mild LVH. Systolic function wasseverely reduced. The estimated ejection fraction was in therange of 25% to 30%. There is hypokinesis of themid-apicalanteroseptal and apical myocardium. The study is  nottechnically sufficient to allow evaluation of LV diastolicfunction. Doppler parameters are consistent with high ventricularfilling pressure. - Aortic valve: There was severe stenosis. There was mildregurgitation. Valve area (VTI): 1.47 cm^2. Valve area (Vmax):1.71 cm^2. Valve area (Vmean): 1.59 cm^2. - Mitral valve: There was mild regurgitation. - Left atrium: The atrium was moderately dilated. - Pulmonary arteries: PA peak pressure: 39 mm Hg (S). - Pericardium, extracardiac: There was a left pleural effusion. Impressions: - The aortic valve stenosis appears to be severe visually. Sincethe prior study of 04/17/13, the gradients across the valve havedecreased likely secondary to development of poor LV systolicfunction since prior echo.  Antibiotics: Ceftriaxone and azithromycin 4/20  Subjective: Patient feels better. However, now he is passing blood in his stool. He's had at least 3-4 episodes since yesterday. Denies any abdominal pain. Breathing is much better. Denies any chest pain.   Objective: Vital Signs  Filed Vitals:   10/29/14 2028 10/30/14 0200 10/30/14 0500 10/30/14 0527  BP: 86/46 106/66  106/63  Pulse: 91 87  82  Temp: 97.7 F (36.5 C)   97.9 F (36.6 C)  TempSrc: Oral   Oral  Resp: 18   16  Height:      Weight:   75.1 kg (165 lb 9.1 oz)   SpO2: 98%   97%    Intake/Output Summary (Last 24 hours) at 10/30/14 0809 Last data filed at 10/30/14 0600  Gross per 24 hour  Intake   1390 ml  Output   1326 ml  Net     64 ml   Filed Weights   10/28/14 0527 10/29/14 0500 10/30/14 0500  Weight: 77.1 kg (169 lb 15.6 oz) 74.7  kg (164 lb 10.9 oz) 75.1 kg (165 lb 9.1 oz)    General appearance: alert, cooperative, appears to be more comfortable today. In no distress. Resp: improved air entry bilaterally. Less crackles. No wheezing. Cardio: regular rate and rhythm, S1, S2 normal, no murmur, click, rub or gallop GI: soft, non-tender; bowel sounds normal; no masses,  no  organomegaly Extremities: extremities normal, atraumatic, no cyanosis or edema Neurologic: Alert. Somewhat distracted. Oriented to person, place. No obvious neurological deficits noted.  Lab Results:  Basic Metabolic Panel:  Recent Labs Lab 10/27/14 1552 10/28/14 0350 10/29/14 0713 10/30/14 0431  NA 133* 134* 133* 133*  K 4.1 3.5 3.8 3.4*  CL 99 96 94* 94*  CO2 26 29 30  32  GLUCOSE 110* 102* 111* 105*  BUN 17 17 24* 27*  CREATININE 1.25 1.21 1.42* 1.42*  CALCIUM 9.1 8.8 8.4 7.8*   Liver Function Tests:  Recent Labs Lab 10/27/14 1552 10/28/14 0350  AST 19 17  ALT 13 11  ALKPHOS 63 56  BILITOT 0.8 0.4  PROT 6.5 5.7*  ALBUMIN 3.8 3.3*    Recent Labs Lab 10/27/14 1552  LIPASE 26   CBC:  Recent Labs Lab 10/27/14 1552 10/29/14 0713 10/29/14 1653 10/30/14 0431  WBC 9.7 12.4* 10.6* 12.8*  NEUTROABS 7.3  --   --   --   HGB 10.5* 10.4* 8.7* 7.5*  HCT 33.4* 33.4* 27.8* 23.9*  MCV 86.8 87.2 87.7 87.2  PLT 421* 445* 379 340   Cardiac Enzymes:  Recent Labs Lab 10/27/14 2116 10/28/14 0254 10/28/14 0948  TROPONINI 0.15* 0.34* 0.19*   BNP (last 3 results)  Recent Labs  10/27/14 1557 10/28/14 0320  BNP 859.9* 1327.8*    Studies/Results: No results found.  Medications:  Scheduled: . sodium chloride   Intravenous Once  . azithromycin  500 mg Intravenous Q24H  . cefTRIAXone (ROCEPHIN)  IV  1 g Intravenous Q24H  . docusate sodium  300 mg Oral Daily  . DULoxetine  30 mg Oral Daily  . erythromycin  1 application Both Eyes QHS  . furosemide  20 mg Intravenous Once  . furosemide  20 mg Intravenous Once  . furosemide  40 mg Intravenous Daily  . gabapentin  1,800 mg Oral QHS  . pantoprazole  40 mg Oral Daily  . polyethylene glycol  17 g Oral Daily  . senna-docusate  3 tablet Oral QHS  . sodium chloride  3 mL Intravenous Q12H  . sodium chloride  3 mL Intravenous Q12H  . tamsulosin  0.4 mg Oral Daily   Continuous:  JIR:CVELFY chloride, sodium  chloride, acetaminophen, morphine injection, ondansetron (ZOFRAN) IV, oxyCODONE-acetaminophen, sodium chloride, sodium chloride, zolpidem  Assessment/Plan:  Active Problems:   Calcific aortic stenosis   Anemia   CHF (congestive heart failure)   Abnormal CT of the abdomen   Hypoxia   CAP (community acquired pneumonia)   Acute respiratory failure with hypoxia   Elevated troponin   Normocytic anemia   Bladder neoplasm    Acute respiratory failure with Hypoxia Most likely secondary to acute systolic congestive heart failure. Echocardiogram report as above. EF is 25-30%. Patient's respiratory status has improved with diuretics. Continue once daily for now. Oxygen to maintain sats greater than 90%.  Hematochezia  Patient has had multiple episodes of bloody stools. He had a similar presentation in 2014. He had a colonoscopy at that time which revealed hemorrhoids and diverticulosis. This appears to be a diverticular bleed. His hemoglobin has dropped for which she  will be transfused. We will consult GI. I have discussed with Dr. Paulita Fujita. Although it's unlikely he is a candidate for any kind of procedure or intervention unless bleeding becomes brisk.   Acute blood loss anemia Transfuse 2 units. This is secondary to GI bleeding as discussed above.  Acute systolic congestive heart failure with severe AS Echocardiogram reveals a year for 25-30%. This is likely secondary to his aortic stenosis. Cardiology is following. Continue Lasix. He has improved from a symptom standpoint. He is not a candidate for any aggressive intervention such as surgery or TAVR. He might even be a candidate for hospice. Palliative medicine input is pending. Discussed with the son.   Minimally elevated troponin EKG shows a nonspecific ST changes. This is most likely demand ischemia. Echocardiogram does show some wall motion abnormalities. Again, patient not a candidate for any further workup. Continue aspirin.    Questionable community-acquired pneumonia Considering all of the above pneumonia is unlikely. We will stop his antibiotics.  Possible urinary bladder mass Discussed with urology at the time of admission. They recommended outpatient management. Continue Foley catheter. Patient has a remote history of prostate cancer. Once again, unlikely he is a candidate for workup considering all of his other comorbidities.  History of dementia Mental status at baseline.  DVT Prophylaxis: Changed to SCDs due to GI bleeding. Code Status: DO NOT RESUSCITATE  Family Communication: Discussed with the patient's son  Disposition Plan: Not ready for discharge. Await palliative medicine input. Await GI input. Blood transfusion today.      LOS: 3 days   [facility] Pager [phone] 10/30/2014, 8:09 AM  If 7PM-7AM, please contact night-coverage at [email or web address], password [facility]

## 2014-10-30 NOTE — Consult Note (Signed)
[facility] Gastroenterology Consultation Note  Referring Provider:  Dr. Bonnielee Haff [facility]) Primary Care Physician:  Estill Dooms, MD Primary Gastroenterologist:  Dr. Clarene Essex  Reason for Consultation:  hematochezia  HPI: Justin Deleon is a 81 y.o. male admitted for hypoxic respiratory failure.  Has multiple medical problems including severe aortic stenosis and imaging findings suggestive of new bladder cancer.  Patient during this admission developed hematochezia and several-gram drop in Hgb.  He has no abdominal pain.  Last blood in stool was early this morning. Had similar episode in October 2014, had colonoscopy by Dr. Watt Climes at that time showing left > right diverticulosis.  Denies melena, nausea, vomiting, constipation, straining during defecation.   Past Medical History  Diagnosis Date  . Depressed   . Hernia   . Kidney stones   . Hypertension   . Prostate cancer   . Cataract 2012    Mohs MD  . Hypertrophy of prostate with urinary obstruction and other lower urinary tract symptoms (LUTS) 08/26/2012  . Edema 08/26/2012  . Ectropion, unspecified 05/27/2012  . Abnormality of gait 11/27/2011  . Unspecified hereditary and idiopathic peripheral neuropathy 08/28/2011  . Basal cell carcinoma of scalp and skin of neck 06/26/2011  . Pain in limb 05/28/2011  . Closed fracture of midcervical section of femur 05/28/2011  . Other and unspecified hyperlipidemia 04/06/2011  . Anemia, unspecified 04/06/2011  . Peripheral vascular disease, unspecified 04/06/2011  . Reflux esophagitis 04/06/2011  . Unspecified constipation 04/06/2011  . Insomnia, unspecified 04/06/2011  . Undiagnosed cardiac murmurs 04/06/2011  . Hypertrophy of prostate with urinary obstruction and other lower urinary tract symptoms (LUTS) 2013  . Depression, acute   . Aortic valve stenosis, rheumatic   . Kidney stones     Past Surgical History  Procedure Laterality Date  . Cholecystectomy  1963/1978  . Hernia repair  1610    umbilical  . Spine surgery  2010    lumbar-L3-4/L4-5  . Fracture surgery Left 03/2011    hip  . Eye surgery      retina repair right  . Esophagogastroduodenoscopy N/A 04/17/2013    Procedure: ESOPHAGOGASTRODUODENOSCOPY (EGD);  Surgeon: Wonda Horner, MD;  Location: [facility] ENDOSCOPY;  Service: Endoscopy;  Laterality: N/A;  . Colonoscopy N/A 04/21/2013    Procedure: COLONOSCOPY;  Surgeon: Jeryl Columbia, MD;  Location: [facility] ENDOSCOPY;  Service: Endoscopy;  Laterality: N/A;  . Skin cancer excision  11/10/13    2 lesions removed back of neck Dr. Nevada Crane  . Skin cancer excision  03/23/14    below left ear    Prior to Admission medications   Medication Sig Start Date End Date Taking? Authorizing Provider  docusate sodium (COLACE) 100 MG capsule Take 300 mg by mouth daily.   Yes Historical Provider, MD  DULoxetine (CYMBALTA) 30 MG capsule One daily to help depression and pains Patient taking differently: Take 30 mg by mouth daily.  10/12/14  Yes Estill Dooms, MD  erythromycin ophthalmic ointment Place 1 application into both eyes at bedtime.   Yes Historical Provider, MD  gabapentin (NEURONTIN) 300 MG capsule Take 6 capsules at night to relieve leg pains Patient taking differently: Take 1,800-2,400 mg by mouth at bedtime.  10/26/14  Yes Estill Dooms, MD  HYDROcodone-acetaminophen (NORCO/VICODIN) 5-325 MG per tablet Take 1 tablet up to 4 times daily for knee pain Patient taking differently: Take 1 tablet by mouth 4 (four) times daily as needed for moderate pain or severe pain.  10/26/14  Yes  Estill Dooms, MD  Multiple Vitamins-Minerals (MULTIVITAMIN WITH MINERALS) tablet Take 1 tablet by mouth daily.    Yes Historical Provider, MD  Multiple Vitamins-Minerals (PRESERVISION AREDS) CAPS Take 2 capsules by mouth daily. In morning   Yes Historical Provider, MD  omeprazole (PRILOSEC) 20 MG capsule Take 20 mg by mouth daily.   Yes Historical Provider, MD  Polyethyl Glycol-Propyl Glycol (SYSTANE) 0.4-0.3 % SOLN  Place 1 drop into both eyes 4 (four) times daily as needed (dry eyes). One drop 4 times daily as needed for dry eyes   Yes Historical Provider, MD  polyethylene glycol (MIRALAX / GLYCOLAX) packet Take 17 g by mouth daily.   Yes Historical Provider, MD  Red Yeast Rice 600 MG TABS Take 3 tablets by mouth daily. In morning   Yes Historical Provider, MD  senna-docusate (SENOKOT-S) 8.6-50 MG per tablet Take 3 tablets by mouth at bedtime.   Yes Historical Provider, MD  tamsulosin (FLOMAX) 0.4 MG CAPS capsule TAKE ONE CAPSULE BY MOUTH EVERY DAY. 07/19/14  Yes Estill Dooms, MD  Vitamin D, Cholecalciferol, 1000 UNITS TABS 2 daily for vitamin D supplementation Patient taking differently: Take 1,000 mg by mouth daily.  06/09/13  Yes Estill Dooms, MD  zolpidem (AMBIEN) 10 MG tablet TAKE 1 TABLET BY MOUTH DAILY AT BEDTIME FOR SLEEP Patient taking differently: TAKE 1 TABLET BY MOUTH DAILY AT BEDTIME AS NEEDED FOR SLEEP 10/18/14  Yes Tiffany L Reed, DO    Current Facility-Administered Medications  Medication Dose Route Frequency Provider Last Rate Last Dose  . 0.9 %  sodium chloride infusion  250 mL Intravenous PRN Toy Baker, MD      . 0.9 %  sodium chloride infusion  250 mL Intravenous PRN Toy Baker, MD      . acetaminophen (TYLENOL) tablet 650 mg  650 mg Oral Q4H PRN Toy Baker, MD   650 mg at 10/28/14 0759  . docusate sodium (COLACE) capsule 300 mg  300 mg Oral Daily Toy Baker, MD   300 mg at 10/30/14 1111  . DULoxetine (CYMBALTA) DR capsule 30 mg  30 mg Oral Daily Toy Baker, MD   30 mg at 10/30/14 1111  . erythromycin ophthalmic ointment 1 application  1 application Both Eyes QHS Toy Baker, MD   1 application at 64/40/34 2113  . furosemide (LASIX) injection 20 mg  20 mg Intravenous Once Bonnielee Haff, MD      . furosemide (LASIX) injection 20 mg  20 mg Intravenous Once Bonnielee Haff, MD      . furosemide (LASIX) injection 40 mg  40 mg Intravenous  Daily Bonnielee Haff, MD   40 mg at 10/30/14 1112  . gabapentin (NEURONTIN) capsule 1,800 mg  1,800 mg Oral QHS Toy Baker, MD   1,800 mg at 10/29/14 2007  . morphine 2 MG/ML injection 1-2 mg  1-2 mg Intravenous Q3H PRN Bonnielee Haff, MD   2 mg at 10/28/14 1613  . ondansetron (ZOFRAN) injection 4 mg  4 mg Intravenous Q6H PRN Toy Baker, MD      . oxyCODONE-acetaminophen (PERCOCET/ROXICET) 5-325 MG per tablet 1-2 tablet  1-2 tablet Oral Q4H PRN Bonnielee Haff, MD   2 tablet at 10/30/14 1107  . pantoprazole (PROTONIX) EC tablet 40 mg  40 mg Oral Daily Toy Baker, MD   40 mg at 10/30/14 1111  . polyethylene glycol (MIRALAX / GLYCOLAX) packet 17 g  17 g Oral Daily Toy Baker, MD   17 g at 10/28/14 0916  .  senna-docusate (Senokot-S) tablet 3 tablet  3 tablet Oral QHS Toy Baker, MD   3 tablet at 10/27/14 2152  . sodium chloride 0.9 % injection 3 mL  3 mL Intravenous Q12H Toy Baker, MD   3 mL at 10/27/14 2225  . sodium chloride 0.9 % injection 3 mL  3 mL Intravenous PRN Toy Baker, MD      . sodium chloride 0.9 % injection 3 mL  3 mL Intravenous Q12H Toy Baker, MD   3 mL at 10/30/14 1112  . sodium chloride 0.9 % injection 3 mL  3 mL Intravenous PRN Toy Baker, MD      . tamsulosin (FLOMAX) capsule 0.4 mg  0.4 mg Oral Daily Toy Baker, MD   0.4 mg at 10/30/14 1111  . zolpidem (AMBIEN) tablet 5 mg  5 mg Oral QHS PRN Toy Baker, MD   5 mg at 10/29/14 2007    Allergies as of 10/27/2014  . (No Known Allergies)    Family History  Problem Relation Age of Onset  . Heart disease Father     MI  . Cancer Sister     bladder    History   Social History  . Marital Status: Widowed    Spouse Name: N/A  . Number of Children: 3  . Years of Education: college   Occupational History  . retired    Social History Main Topics  . Smoking status: Former Smoker    Types: Cigars    Quit date: 12/10/1987  . Smokeless  tobacco: Never Used  . Alcohol Use: 0.6 oz/week    1 Cans of beer per week     Comment: 1 week  . Drug Use: No  . Sexual Activity: No   Other Topics Concern  . Not on file   Social History Narrative   Lives at [place] with walker   Former smoker stopped 1989   Alcohol on can of beer a week    Exercise goes to [employer] 3 days a week, upper body     DNR, POA   Patient is right handed.   Patient drinks 2 cups of caffeine a day.             Review of Systems: ROS Dr. Roel Cluck 10/27/14 reviewed and I agree  Physical Exam: Vital signs in last 24 hours: Temp:  [97.7 F (36.5 C)-98 F (36.7 C)] 97.9 F (36.6 C) (04/23 0527) Pulse Rate:  [82-92] 82 (04/23 0527) Resp:  [16-18] 16 (04/23 0527) BP: (86-106)/(46-66) 106/63 mmHg (04/23 0527) SpO2:  [94 %-98 %] 97 % (04/23 0527) Weight:  [75.1 kg (165 lb 9.1 oz)] 75.1 kg (165 lb 9.1 oz) (04/23 0500) Last BM Date: 10/30/14 General:   Alert,  Elderly and somewhat frail-appearing, but is in NAD Head:  Normocephalic and atraumatic. Eyes:  Sclera clear, no icterus.   Conjunctiva pale Ears:  Normal auditory acuity. Nose:  No deformity, discharge,  or lesions. Mouth:  No deformity or lesions.  Oropharynx pale and dry Neck:  Supple; no masses or thyromegaly. Lungs:  Clear throughout to auscultation.   No wheezes, crackles, or rhonchi. No acute distress. Heart:  Regular rate and rhythm; harsh IV/VI SEM murmur LUSB; no clicks, rubs,  or gallops. Abdomen:  Soft, nontender and nondistended. Small reducible umbilical hernia; small old midline incision; No masses, hepatosplenomegaly or hernias noted. Normal bowel sounds, without guarding, and without rebound.     Msk:  Symmetrical without gross deformities. Normal posture. Pulses:  Normal pulses noted. Extremities:  Without clubbing or edema. Neurologic:  Alert and  oriented x4;  grossly normal neurologically. Skin:  Thin, scattered eccymoses, otherwise  intact without significant lesions or rashes. Cervical Nodes:  No significant cervical adenopathy. Psych:  Alert and cooperative. Normal mood and affect.   Lab Results:  Recent Labs  10/29/14 0713 10/29/14 1653 10/30/14 0431  WBC 12.4* 10.6* 12.8*  HGB 10.4* 8.7* 7.5*  HCT 33.4* 27.8* 23.9*  PLT 445* 379 340   BMET  Recent Labs  10/28/14 0350 10/29/14 0713 10/30/14 0431  NA 134* 133* 133*  K 3.5 3.8 3.4*  CL 96 94* 94*  CO2 29 30 32  GLUCOSE 102* 111* 105*  BUN 17 24* 27*  CREATININE 1.21 1.42* 1.42*  CALCIUM 8.8 8.4 7.8*   LFT  Recent Labs  10/28/14 0350  PROT 5.7*  ALBUMIN 3.3*  AST 17  ALT 11  ALKPHOS 56  BILITOT 0.4   PT/INR No results for input(s): LABPROT, INR in the last 72 hours.  Studies/Results: No results found.  Impression:  1.  Hematochezia, stable, suspect diverticular.  Perhaps triggered by DVT prophylaxis pharmacotherapy. 2.  Anemia, acute blood loss, likely from #1 above. 3.  Multiple medical problems.  Plan:  1.  Do not plan to pursue colonoscopy or any other invasive testing. 2.  Avoid/minimize use of NSAIDs and blood thinners for the time-being. 3.  Follow bleeding supportively; serial CBCs. 4.  If patient has further significant bleeding, consider tagged RBC study as next step in management. 5.  Clear liquid diet today, might advance tomorrow if no further bleeding today. 6.  Will follow; thank you for the consultation.   LOS: 3 days   Clelia Trabucco M  10/30/2014, 11:59 AM

## 2014-10-30 NOTE — Progress Notes (Signed)
Large melanotic stool.  MD aware pt had several yesterday with a drop in hgb.  Will continue to observe and call if VS warrant.

## 2014-10-30 NOTE — Progress Notes (Signed)
Palliative Medicine Team consult was received. We will schedule a meeting with patient and his family at the earliest possible time we have a provider available.(estimated time to contact 4/24). If there are urgent needs or questions please call [phone]. Thank you for consulting out team to assist with this patients care.

## 2014-10-31 LAB — CBC
HCT: 29 % — ABNORMAL LOW (ref 39.0–52.0)
HCT: 30.1 % — ABNORMAL LOW (ref 39.0–52.0)
HEMOGLOBIN: 9.3 g/dL — AB (ref 13.0–17.0)
Hemoglobin: 9.6 g/dL — ABNORMAL LOW (ref 13.0–17.0)
MCH: 28.1 pg (ref 26.0–34.0)
MCH: 27.8 pg (ref 26.0–34.0)
MCHC: 32.1 g/dL (ref 30.0–36.0)
MCHC: 31.9 g/dL (ref 30.0–36.0)
MCV: 87.6 fL (ref 78.0–100.0)
MCV: 87.2 fL (ref 78.0–100.0)
PLATELETS: 337 10*3/uL (ref 150–400)
Platelets: 373 10*3/uL (ref 150–400)
RBC: 3.31 MIL/uL — AB (ref 4.22–5.81)
RBC: 3.45 MIL/uL — AB (ref 4.22–5.81)
RDW: 17.6 % — ABNORMAL HIGH (ref 11.5–15.5)
RDW: 17.5 % — AB (ref 11.5–15.5)
WBC: 9.2 10*3/uL (ref 4.0–10.5)
WBC: 8.3 10*3/uL (ref 4.0–10.5)

## 2014-10-31 LAB — BASIC METABOLIC PANEL
Anion gap: 7 (ref 5–15)
BUN: 24 mg/dL — ABNORMAL HIGH (ref 6–23)
CALCIUM: 8.2 mg/dL — AB (ref 8.4–10.5)
CHLORIDE: 95 mmol/L — AB (ref 96–112)
CO2: 33 mmol/L — AB (ref 19–32)
Creatinine, Ser: 1.36 mg/dL — ABNORMAL HIGH (ref 0.50–1.35)
GFR calc non Af Amer: 45 mL/min — ABNORMAL LOW (ref >90)
GFR, EST AFRICAN AMERICAN: 52 mL/min — AB (ref >90)
Glucose, Bld: 97 mg/dL (ref 70–99)
Potassium: 3.3 mmol/L — ABNORMAL LOW (ref 3.5–5.1)
Sodium: 135 mmol/L (ref 135–145)

## 2014-10-31 LAB — TYPE AND SCREEN
ABO/RH(D): O POS
ANTIBODY SCREEN: NEGATIVE
Unit division: 0
Unit division: 0

## 2014-10-31 MED ORDER — POTASSIUM CHLORIDE CRYS ER 20 MEQ PO TBCR
40.0000 meq | EXTENDED_RELEASE_TABLET | Freq: Once | ORAL | Status: AC
  Administered 2014-10-31: 40 meq via ORAL
  Filled 2014-10-31: qty 2

## 2014-10-31 MED ORDER — FUROSEMIDE 40 MG PO TABS
40.0000 mg | ORAL_TABLET | Freq: Every day | ORAL | Status: DC
  Administered 2014-10-31 – 2014-11-02 (×3): 40 mg via ORAL
  Filled 2014-10-31 (×3): qty 1

## 2014-10-31 NOTE — Progress Notes (Signed)
Patient Name: Justin Deleon Date of Encounter: 10/31/2014     Active Problems:   Calcific aortic stenosis   CHF (congestive heart failure)   Abnormal CT of the abdomen   Hypoxia   CAP (community acquired pneumonia)   Acute respiratory failure with hypoxia   Elevated troponin   Normocytic anemia   Bladder neoplasm   Acute blood loss anemia   Hematochezia    SUBJECTIVE  Patient denies chest discomfort or shortness of breath this morning.  He is still passing blood in his stool but the amount and frequency has declined.  Hemoglobin has dropped overnight. His echocardiogram  shows poor left ventricular function in the face of severe aortic stenosis.  His blood pressure remains soft which is multifactorial including his severe aortic stenosis and his anemia.  CURRENT MEDS . docusate sodium  300 mg Oral Daily  . DULoxetine  30 mg Oral Daily  . erythromycin  1 application Both Eyes QHS  . furosemide  40 mg Oral Daily  . gabapentin  1,800 mg Oral QHS  . pantoprazole  40 mg Oral Daily  . polyethylene glycol  17 g Oral Daily  . potassium chloride  40 mEq Oral Once  . senna-docusate  3 tablet Oral QHS  . sodium chloride  3 mL Intravenous Q12H  . sodium chloride  3 mL Intravenous Q12H  . tamsulosin  0.4 mg Oral Daily    OBJECTIVE  Filed Vitals:   10/30/14 1613 10/30/14 1655 10/30/14 1925 10/31/14 0448  BP: 95/51 100/64 95/58 109/63  Pulse: 93 96 86 82  Temp: 98.2 F (36.8 C) 98 F (36.7 C) 97.6 F (36.4 C) 98 F (36.7 C)  TempSrc: Oral Oral Oral Oral  Resp: 20 18 20 18   Height:      Weight:    159 lb 6.3 oz (72.3 kg)  SpO2: 98% 94% 99% 95%    Intake/Output Summary (Last 24 hours) at 10/31/14 0926 Last data filed at 10/31/14 0456  Gross per 24 hour  Intake    780 ml  Output   2100 ml  Net  -1320 ml   Filed Weights   10/29/14 0500 10/30/14 0500 10/31/14 0448  Weight: 164 lb 10.9 oz (74.7 kg) 165 lb 9.1 oz (75.1 kg) 159 lb 6.3 oz (72.3 kg)    PHYSICAL  EXAM  General: Pleasant, NAD.  Confused.  He is sitting up in a chair this morning and tolerating well.  Very alert and cooperative.  Lungs:  Resp regular and unlabored, CTA. Heart: Regular sinus rhythm.  Grade 3/6 harsh systolic ejection murmur of aortic stenosis widely heard across precordium. Abdomen: Soft, non-tender, non-distended, BS + x 4.  Extremities: No clubbing, cyanosis or edema. DP/PT/Radials 2+ and equal bilaterally.  Accessory Clinical Findings  CBC  Recent Labs  10/30/14 2142 10/31/14 0519  WBC 8.8 9.2  HGB 9.3* 9.3*  HCT 29.3* 29.0*  MCV 86.4 87.6  PLT 334 824   Basic Metabolic Panel  Recent Labs  10/30/14 0431 10/31/14 0519  NA 133* 135  K 3.4* 3.3*  CL 94* 95*  CO2 32 33*  GLUCOSE 105* 97  BUN 27* 24*  CREATININE 1.42* 1.36*  CALCIUM 7.8* 8.2*   Liver Function Tests No results for input(s): AST, ALT, ALKPHOS, BILITOT, PROT, ALBUMIN in the last 72 hours. No results for input(s): LIPASE, AMYLASE in the last 72 hours. Cardiac Enzymes  Recent Labs  10/28/14 0948  TROPONINI 0.19*   BNP Invalid input(s): POCBNP  D-Dimer No results for input(s): DDIMER in the last 72 hours. Hemoglobin A1C No results for input(s): HGBA1C in the last 72 hours. Fasting Lipid Panel No results for input(s): CHOL, HDL, LDLCALC, TRIG, CHOLHDL, LDLDIRECT in the last 72 hours. Thyroid Function Tests No results for input(s): TSH, T4TOTAL, T3FREE, THYROIDAB in the last 72 hours.  Invalid input(s): FREET3  TELE  Normal sinus rhythm    Radiology/Studies  Dg Chest 2 View  10/27/2014   CLINICAL DATA:  Chest pain and short of breath  EXAM: CHEST  2 VIEW  COMPARISON:  03/19/2012.  FINDINGS: Bilateral airspace disease, perihilar predominance. Small bilateral pleural effusions with bibasilar atelectasis. Cardiac enlargement. Previously the lungs are clear.  IMPRESSION: Congestive heart failure with edema and small effusions.   Electronically Signed   By: Franchot Gallo  M.D.   On: 10/27/2014 16:30   Ct Abdomen Pelvis W Contrast  10/27/2014   CLINICAL DATA:  Lower abdominal pain for 3 weeks  EXAM: CT ABDOMEN AND PELVIS WITH CONTRAST  TECHNIQUE: Multidetector CT imaging of the abdomen and pelvis was performed using the standard protocol following bolus administration of intravenous contrast.  CONTRAST:  67mL OMNIPAQUE IOHEXOL 300 MG/ML SOLN, 170mL OMNIPAQUE IOHEXOL 300 MG/ML SOLN  COMPARISON:  None.  FINDINGS: Bilateral pleural effusions are noted with bibasilar infiltrate.  The gallbladder has been surgically removed. The liver, spleen and pancreas are within normal limits. The adrenal glands are enlarged bilaterally likely related hypertrophy. No definitive mass lesion is seen.  The kidneys are well visualized bilaterally and demonstrate bilateral renal cystic change. This is much worse on the left than the right with at least 2 dominant cysts. These measure 8.1 cm and 10.8 cm. Delayed images demonstrate normal excretion of contrast material. Aortoiliac calcifications are seen.  Small anterior abdominal wall hernia is noted adjacent to the umbilicus containing fat. No herniation of bowel loops within is noted. The prostate shows multiple brachytherapy seeds. The bladder is partially distended. A irregular enhancing lesion is noted arising from the inferior aspect of the urinary bladder. This is consistent with a bladder neoplasm until proven otherwise. No significant pelvic lymphadenopathy is identified.  The bony structures show changes of prior left hip pinning. Degenerative changes of the lumbar spine are seen.  IMPRESSION: Changes suggestive of bladder mass. Direct visualization is recommended.  Bilateral lower lobe infiltrates with associated effusions.  Bilateral renal cystic change.  Fat containing periumbilical hernia.   Electronically Signed   By: Inez Catalina M.D.   On: 10/27/2014 17:49    ASSESSMENT AND PLAN 1.  Acute on chronic diastolic heart failure.  Renal  function stable on low dose Lasix. 2.  Severe aortic stenosis.  Not a surgical candidate for surgery or TAVR. 3.  Severe dementia 4.  Bladder mass possible cancer 5.  Hematochezia.  History of diverticulosis.  GI is following.  Hemoglobin stable overnight  Plan:   Continue conservative management.  Palliative care consult pending.   Signed, Darlin Coco MD

## 2014-10-31 NOTE — Progress Notes (Signed)
[facility] PROGRESS NOTE  Justin Deleon HBZ:[id number] DOB: 1925/07/14 DOA: 10/27/2014  PCP: Estill Dooms, MD  Brief HPI: 81 year old Caucasian male with a past medical history that is quite significant and mentioned below, presented with generalized weakness. He was found to be short of breath. He was complaining of abdominal pain. Chest x-ray suggested congestive heart failure versus pneumonia. He was admitted for further management. He was managed for congestive heart failure. EF is 25-30%. Severe aortic stenosis was detected. He then developed rectal bleeding. He was transfused 2 units of blood. He is to have stabilized. GI and cardiology are following.  Past medical history:  Past Medical History  Diagnosis Date  . Depressed   . Hernia   . Kidney stones   . Hypertension   . Prostate cancer   . Cataract 2012    Mohs MD  . Hypertrophy of prostate with urinary obstruction and other lower urinary tract symptoms (LUTS) 08/26/2012  . Edema 08/26/2012  . Ectropion, unspecified 05/27/2012  . Abnormality of gait 11/27/2011  . Unspecified hereditary and idiopathic peripheral neuropathy 08/28/2011  . Basal cell carcinoma of scalp and skin of neck 06/26/2011  . Pain in limb 05/28/2011  . Closed fracture of midcervical section of femur 05/28/2011  . Other and unspecified hyperlipidemia 04/06/2011  . Anemia, unspecified 04/06/2011  . Peripheral vascular disease, unspecified 04/06/2011  . Reflux esophagitis 04/06/2011  . Unspecified constipation 04/06/2011  . Insomnia, unspecified 04/06/2011  . Undiagnosed cardiac murmurs 04/06/2011  . Hypertrophy of prostate with urinary obstruction and other lower urinary tract symptoms (LUTS) 2013  . Depression, acute   . Aortic valve stenosis, rheumatic   . Kidney stones     Consultants: Cardiology, urology over the phone, gastroenterology  Procedures:  2-D echocardiogram Study Conclusions - Left ventricle: The cavity size was normal. Wall  thickness wasincreased in a pattern of mild LVH. Systolic function wasseverely reduced. The estimated ejection fraction was in therange of 25% to 30%. There is hypokinesis of themid-apicalanteroseptal and apical myocardium. The study is nottechnically sufficient to allow evaluation of LV diastolicfunction. Doppler parameters are consistent with high ventricularfilling pressure. - Aortic valve: There was severe stenosis. There was mildregurgitation. Valve area (VTI): 1.47 cm^2. Valve area (Vmax):1.71 cm^2. Valve area (Vmean): 1.59 cm^2. - Mitral valve: There was mild regurgitation. - Left atrium: The atrium was moderately dilated. - Pulmonary arteries: PA peak pressure: 39 mm Hg (S). - Pericardium, extracardiac: There was a left pleural effusion. Impressions: - The aortic valve stenosis appears to be severe visually. Sincethe prior study of 04/17/13, the gradients across the valve havedecreased likely secondary to development of poor LV systolicfunction since prior echo.  Antibiotics: Ceftriaxone and azithromycin 4/20-4/23  Subjective: Patient denies any further episodes of rectal bleeding. Per the nurse, he had one episode yesterday afternoon, but none since then. He tolerated blood transfusion well. Denies any other complaints.   Objective: Vital Signs  Filed Vitals:   10/30/14 1613 10/30/14 1655 10/30/14 1925 10/31/14 0448  BP: 95/51 100/64 95/58 109/63  Pulse: 93 96 86 82  Temp: 98.2 F (36.8 C) 98 F (36.7 C) 97.6 F (36.4 C) 98 F (36.7 C)  TempSrc: Oral Oral Oral Oral  Resp: 20 18 20 18   Height:      Weight:    72.3 kg (159 lb 6.3 oz)  SpO2: 98% 94% 99% 95%    Intake/Output Summary (Last 24 hours) at 10/31/14 0834 Last data filed at 10/31/14 0456  Gross per 24  hour  Intake    780 ml  Output   2100 ml  Net  -1320 ml   Filed Weights   10/29/14 0500 10/30/14 0500 10/31/14 0448  Weight: 74.7 kg (164 lb 10.9 oz) 75.1 kg (165 lb 9.1 oz) 72.3 kg (159 lb 6.3 oz)      General appearance: alert, cooperative, In no distress. Resp: improved air entry bilaterally. No crackles or wheezing.  Cardio: regular rate and rhythm, S1, S2 normal, no murmur, click, rub or gallop GI: soft, non-tender; bowel sounds normal; no masses,  no organomegaly Neurologic: Alert. Somewhat distracted. Oriented to person, place. No obvious neurological deficits noted.  Lab Results:  Basic Metabolic Panel:  Recent Labs Lab 10/27/14 1552 10/28/14 0350 10/29/14 0713 10/30/14 0431 10/31/14 0519  NA 133* 134* 133* 133* 135  K 4.1 3.5 3.8 3.4* 3.3*  CL 99 96 94* 94* 95*  CO2 26 29 30  32 33*  GLUCOSE 110* 102* 111* 105* 97  BUN 17 17 24* 27* 24*  CREATININE 1.25 1.21 1.42* 1.42* 1.36*  CALCIUM 9.1 8.8 8.4 7.8* 8.2*   Liver Function Tests:  Recent Labs Lab 10/27/14 1552 10/28/14 0350  AST 19 17  ALT 13 11  ALKPHOS 63 56  BILITOT 0.8 0.4  PROT 6.5 5.7*  ALBUMIN 3.8 3.3*    Recent Labs Lab 10/27/14 1552  LIPASE 26   CBC:  Recent Labs Lab 10/27/14 1552 10/29/14 0713 10/29/14 1653 10/30/14 0431 10/30/14 2142 10/31/14 0519  WBC 9.7 12.4* 10.6* 12.8* 8.8 9.2  NEUTROABS 7.3  --   --   --   --   --   HGB 10.5* 10.4* 8.7* 7.5* 9.3* 9.3*  HCT 33.4* 33.4* 27.8* 23.9* 29.3* 29.0*  MCV 86.8 87.2 87.7 87.2 86.4 87.6  PLT 421* 445* 379 340 334 337   Cardiac Enzymes:  Recent Labs Lab 10/27/14 2116 10/28/14 0254 10/28/14 0948  TROPONINI 0.15* 0.34* 0.19*   BNP (last 3 results)  Recent Labs  10/27/14 1557 10/28/14 0320  BNP 859.9* 1327.8*    Studies/Results: No results found.  Medications:  Scheduled: . docusate sodium  300 mg Oral Daily  . DULoxetine  30 mg Oral Daily  . erythromycin  1 application Both Eyes QHS  . furosemide  40 mg Oral Daily  . gabapentin  1,800 mg Oral QHS  . pantoprazole  40 mg Oral Daily  . polyethylene glycol  17 g Oral Daily  . potassium chloride  40 mEq Oral Once  . senna-docusate  3 tablet Oral QHS  . sodium  chloride  3 mL Intravenous Q12H  . sodium chloride  3 mL Intravenous Q12H  . tamsulosin  0.4 mg Oral Daily   Continuous:  ELF:YBOFBP chloride, sodium chloride, acetaminophen, ALPRAZolam, morphine injection, ondansetron (ZOFRAN) IV, oxyCODONE-acetaminophen, sodium chloride, sodium chloride, zolpidem  Assessment/Plan:  Active Problems:   Calcific aortic stenosis   CHF (congestive heart failure)   Abnormal CT of the abdomen   Hypoxia   CAP (community acquired pneumonia)   Acute respiratory failure with hypoxia   Elevated troponin   Normocytic anemia   Bladder neoplasm   Acute blood loss anemia   Hematochezia    Acute respiratory failure with Hypoxia Much improved. This was most likely secondary to acute systolic congestive heart failure. Echocardiogram report as above. EF is 25-30%. Patient's respiratory status has improved with diuretics. Change to oral. Oxygen to maintain sats greater than 90%.  Hematochezia  Patient has had multiple episodes of bloody stools.  This was likely triggered by anticoagulants. Appears to have subsided. Hemoglobin remained stable after transfusion of 2 units. Appreciate gastroenterology input. No plans for procedures. He had a similar presentation in 2014. He had a colonoscopy at that time which revealed hemorrhoids and diverticulosis. This appears to be a diverticular bleed.   Acute blood loss anemia Hemoglobin improved after blood transfusion.   Acute systolic congestive heart failure with severe AS Echocardiogram reveals a year for 25-30%. This is likely secondary to his aortic stenosis. Cardiology is following. Continue Lasix. We'll change to oral. He has improved from a symptom standpoint. He is not a candidate for any aggressive intervention such as surgery or TAVR. He might even be a candidate for hospice. Palliative medicine input is pending.   Minimally elevated troponin EKG shows a nonspecific ST changes. This is most likely demand ischemia.  Echocardiogram does show some wall motion abnormalities. Again, patient not a candidate for any further workup. Aspirin discontinued due to his rectal bleeding.  Questionable community-acquired pneumonia Considering all of the above pneumonia is unlikely. Antibiotics were discontinued.   Possible urinary bladder mass Discussed with urology at the time of admission. They recommended outpatient management. Continue Foley catheter as patient did have some retention when he initially presented. Patient has a remote history of prostate cancer. Once again, unlikely he is a candidate for workup considering all of his other comorbidities. He will need to be discharged with his Foley catheter. He will need to be seen by urology as an outpatient.  History of dementia Mental status at baseline.  DVT Prophylaxis: SCDs Code Status: DO NOT RESUSCITATE  Family Communication: Discussed with the patient Disposition Plan: Not ready for discharge. Await palliative medicine input. Will need to go to skilled nursing facility when ready for discharge.      LOS: 4 days   [facility] Pager [phone] 10/31/2014, 8:34 AM  If 7PM-7AM, please contact night-coverage at [email or web address], password [facility]

## 2014-10-31 NOTE — Progress Notes (Signed)
Subjective: One episode of clotted blood this morning. No abdominal pain. Tolerating diet.  Objective: Vital signs in last 24 hours: Temp:  [97.6 F (36.4 C)-98.3 F (36.8 C)] 98 F (36.7 C) (04/24 0448) Pulse Rate:  [82-96] 82 (04/24 0448) Resp:  [18-20] 18 (04/24 0448) BP: (95-109)/(51-64) 109/63 mmHg (04/24 0448) SpO2:  [94 %-100 %] 95 % (04/24 0448) Weight:  [72.3 kg (159 lb 6.3 oz)] 72.3 kg (159 lb 6.3 oz) (04/24 0448) Weight change: -2.8 kg (-6 lb 2.8 oz) Last BM Date: 10/31/14 (bloody, red, stool)  PE: GEN:  NAD, chronically ill-appearing ABD:  Soft, small umbilical hernia, non-tender  Lab Results: CBC    Component Value Date/Time   WBC 9.2 10/31/2014 0519   WBC 8.3 02/22/2014   WBC 9.1 03/19/2012 1203   RBC 3.31* 10/31/2014 0519   RBC 3.29* 04/17/2013 1600   RBC 4.00* 03/19/2012 1203   HGB 9.3* 10/31/2014 0519   HGB 11.0* 03/19/2012 1203   HCT 29.0* 10/31/2014 0519   HCT 35.8* 03/19/2012 1203   PLT 337 10/31/2014 0519   MCV 87.6 10/31/2014 0519   MCV 89.4 03/19/2012 1203   MCH 28.1 10/31/2014 0519   MCH 27.5 03/19/2012 1203   MCHC 32.1 10/31/2014 0519   MCHC 30.7* 03/19/2012 1203   RDW 17.6* 10/31/2014 0519   LYMPHSABS 1.2 10/27/2014 1552   MONOABS 1.2* 10/27/2014 1552   EOSABS 0.1 10/27/2014 1552   BASOSABS 0.0 10/27/2014 1552   CMP     Component Value Date/Time   NA 135 10/31/2014 0519   NA 139 08/02/2014   K 3.3* 10/31/2014 0519   CL 95* 10/31/2014 0519   CO2 33* 10/31/2014 0519   GLUCOSE 97 10/31/2014 0519   BUN 24* 10/31/2014 0519   BUN 22* 08/02/2014   CREATININE 1.36* 10/31/2014 0519   CREATININE 1.4* 08/02/2014   CALCIUM 8.2* 10/31/2014 0519   PROT 5.7* 10/28/2014 0350   ALBUMIN 3.3* 10/28/2014 0350   AST 17 10/28/2014 0350   ALT 11 10/28/2014 0350   ALKPHOS 56 10/28/2014 0350   BILITOT 0.4 10/28/2014 0350   GFRNONAA 45* 10/31/2014 0519   GFRAA 52* 10/31/2014 0519   Assessment:  1.  Hematochezia.  Suspect diverticular. Clotted  blood this morning is likely passage of old blood; doubt ongoing acute bleeding. 2.  Anemia, acute blood loss, appropriate Hgb response after transfusion of 2 units. 3.  Multiple medical problems.  Plan:  1.  Continued observation; if no further bleeding over the next 24 hours, would likely be able to transition to skilled nursing (from GI perspective) late tomorrow (Monday); if further bleeding, might have to consider additional 24 hour observation; if rampant acute bleeding, would consider tagged RBC study as next step in management. 2.  Follow serial CBCs. 3.  Advance to soft diet. 4.  [facility] will follow.   Landry Dyke 10/31/2014, 11:11 AM

## 2014-11-01 DIAGNOSIS — I5043 Acute on chronic combined systolic (congestive) and diastolic (congestive) heart failure: Secondary | ICD-10-CM

## 2014-11-01 DIAGNOSIS — M792 Neuralgia and neuritis, unspecified: Secondary | ICD-10-CM

## 2014-11-01 DIAGNOSIS — Z789 Other specified health status: Secondary | ICD-10-CM

## 2014-11-01 DIAGNOSIS — Z515 Encounter for palliative care: Secondary | ICD-10-CM | POA: Insufficient documentation

## 2014-11-01 LAB — CBC
HCT: 28.5 % — ABNORMAL LOW (ref 39.0–52.0)
HCT: 31 % — ABNORMAL LOW (ref 39.0–52.0)
Hemoglobin: 8.9 g/dL — ABNORMAL LOW (ref 13.0–17.0)
Hemoglobin: 9.7 g/dL — ABNORMAL LOW (ref 13.0–17.0)
MCH: 27.6 pg (ref 26.0–34.0)
MCH: 27.6 pg (ref 26.0–34.0)
MCHC: 31.2 g/dL (ref 30.0–36.0)
MCHC: 31.3 g/dL (ref 30.0–36.0)
MCV: 88.2 fL (ref 78.0–100.0)
MCV: 88.1 fL (ref 78.0–100.0)
PLATELETS: 305 10*3/uL (ref 150–400)
PLATELETS: 367 10*3/uL (ref 150–400)
RBC: 3.23 MIL/uL — ABNORMAL LOW (ref 4.22–5.81)
RBC: 3.52 MIL/uL — ABNORMAL LOW (ref 4.22–5.81)
RDW: 17.2 % — ABNORMAL HIGH (ref 11.5–15.5)
RDW: 17.2 % — AB (ref 11.5–15.5)
WBC: 8.7 10*3/uL (ref 4.0–10.5)
WBC: 8 10*3/uL (ref 4.0–10.5)

## 2014-11-01 LAB — BASIC METABOLIC PANEL
ANION GAP: 6 (ref 5–15)
BUN: 19 mg/dL (ref 6–23)
CO2: 34 mmol/L — ABNORMAL HIGH (ref 19–32)
CREATININE: 1.3 mg/dL (ref 0.50–1.35)
Calcium: 8.2 mg/dL — ABNORMAL LOW (ref 8.4–10.5)
Chloride: 94 mmol/L — ABNORMAL LOW (ref 96–112)
GFR calc Af Amer: 55 mL/min — ABNORMAL LOW (ref >90)
GFR calc non Af Amer: 48 mL/min — ABNORMAL LOW (ref >90)
Glucose, Bld: 99 mg/dL (ref 70–99)
Potassium: 3.7 mmol/L (ref 3.5–5.1)
Sodium: 134 mmol/L — ABNORMAL LOW (ref 135–145)

## 2014-11-01 MED ORDER — ALPRAZOLAM 1 MG PO TABS: 1.0000 mg | ORAL_TABLET | Freq: Every evening | ORAL | Status: DC | PRN

## 2014-11-01 MED ORDER — SENNOSIDES-DOCUSATE SODIUM 8.6-50 MG PO TABS
1.0000 | ORAL_TABLET | Freq: Every day | ORAL | Status: DC
  Administered 2014-11-01: 1 via ORAL
  Filled 2014-11-01: qty 1

## 2014-11-01 MED ORDER — DOCUSATE SODIUM 100 MG PO CAPS: 200.0000 mg | ORAL_CAPSULE | Freq: Every day | ORAL | Status: DC

## 2014-11-01 MED ORDER — PANTOPRAZOLE SODIUM 40 MG PO TBEC: 40.0000 mg | DELAYED_RELEASE_TABLET | Freq: Every day | ORAL | Status: DC

## 2014-11-01 MED ORDER — OXYCODONE HCL 5 MG PO TABS: 10.0000 mg | ORAL_TABLET | ORAL | Status: DC | PRN

## 2014-11-01 MED ORDER — OXYCODONE HCL ER 20 MG PO T12A
20.0000 mg | EXTENDED_RELEASE_TABLET | Freq: Two times a day (BID) | ORAL | Status: DC
  Administered 2014-11-01 – 2014-11-02 (×3): 20 mg via ORAL
  Filled 2014-11-01 (×3): qty 1

## 2014-11-01 MED ORDER — POLYETHYLENE GLYCOL 3350 17 G PO PACK
17.0000 g | PACK | Freq: Every day | ORAL | Status: DC | PRN
Start: 2014-11-01 — End: 2014-11-02

## 2014-11-01 MED ORDER — ALPRAZOLAM 0.25 MG PO TABS
0.2500 mg | ORAL_TABLET | Freq: Three times a day (TID) | ORAL | Status: DC
  Administered 2014-11-01 – 2014-11-02 (×3): 0.25 mg via ORAL
  Filled 2014-11-01 (×3): qty 1

## 2014-11-01 MED ORDER — FUROSEMIDE 40 MG PO TABS
40.0000 mg | ORAL_TABLET | Freq: Once | ORAL | Status: AC
  Administered 2014-11-01: 40 mg via ORAL
  Filled 2014-11-01: qty 1

## 2014-11-01 NOTE — Progress Notes (Signed)
Palliative Meeting with Family today at [facility].

## 2014-11-01 NOTE — Progress Notes (Signed)
[facility] PROGRESS NOTE  Justin Deleon YDX:[id number] DOB: 1926-06-16 DOA: 10/27/2014  PCP: Estill Dooms, MD  Brief HPI: 81 year old Caucasian male with a past medical history that is quite significant and mentioned below, presented with generalized weakness. He was found to be short of breath. He was complaining of abdominal pain. Chest x-ray suggested congestive heart failure versus pneumonia. He was admitted for further management. He was managed for congestive heart failure. EF is 25-30%. Severe aortic stenosis was detected. He then developed rectal bleeding. He was transfused 2 units of blood. He is to have stabilized. GI and cardiology are following.  Past medical history:  Past Medical History  Diagnosis Date  . Depressed   . Hernia   . Kidney stones   . Hypertension   . Prostate cancer   . Cataract 2012    Mohs MD  . Hypertrophy of prostate with urinary obstruction and other lower urinary tract symptoms (LUTS) 08/26/2012  . Edema 08/26/2012  . Ectropion, unspecified 05/27/2012  . Abnormality of gait 11/27/2011  . Unspecified hereditary and idiopathic peripheral neuropathy 08/28/2011  . Basal cell carcinoma of scalp and skin of neck 06/26/2011  . Pain in limb 05/28/2011  . Closed fracture of midcervical section of femur 05/28/2011  . Other and unspecified hyperlipidemia 04/06/2011  . Anemia, unspecified 04/06/2011  . Peripheral vascular disease, unspecified 04/06/2011  . Reflux esophagitis 04/06/2011  . Unspecified constipation 04/06/2011  . Insomnia, unspecified 04/06/2011  . Undiagnosed cardiac murmurs 04/06/2011  . Hypertrophy of prostate with urinary obstruction and other lower urinary tract symptoms (LUTS) 2013  . Depression, acute   . Aortic valve stenosis, rheumatic   . Kidney stones     Consultants: Cardiology, urology over the phone, gastroenterology  Procedures:  2-D echocardiogram Study Conclusions - Left ventricle: The cavity size was normal. Wall  thickness wasincreased in a pattern of mild LVH. Systolic function wasseverely reduced. The estimated ejection fraction was in therange of 25% to 30%. There is hypokinesis of themid-apicalanteroseptal and apical myocardium. The study is nottechnically sufficient to allow evaluation of LV diastolicfunction. Doppler parameters are consistent with high ventricularfilling pressure. - Aortic valve: There was severe stenosis. There was mildregurgitation. Valve area (VTI): 1.47 cm^2. Valve area (Vmax):1.71 cm^2. Valve area (Vmean): 1.59 cm^2. - Mitral valve: There was mild regurgitation. - Left atrium: The atrium was moderately dilated. - Pulmonary arteries: PA peak pressure: 39 mm Hg (S). - Pericardium, extracardiac: There was a left pleural effusion. Impressions: - The aortic valve stenosis appears to be severe visually. Sincethe prior study of 04/17/13, the gradients across the valve havedecreased likely secondary to development of poor LV systolicfunction since prior echo.  Antibiotics: Ceftriaxone and azithromycin 4/20-4/23  Subjective: Patient in some pain this morning. Present over all over the body. Denies any chest pain. Denies any difficulty breathing. No further episodes of focal bleeding noted per patient or by nursing reports.   Objective: Vital Signs  Filed Vitals:   10/31/14 1443 10/31/14 1957 11/01/14 0217 11/01/14 0536  BP: 107/66 106/69 102/60 101/47  Pulse: 84 88 68 76  Temp: 97.7 F (36.5 C)  97.5 F (36.4 C) 97.9 F (36.6 C)  TempSrc: Oral  Oral Oral  Resp: 18 18 18 18   Height:      Weight:    75 kg (165 lb 5.5 oz)  SpO2: 100% 98% 97% 98%    Intake/Output Summary (Last 24 hours) at 11/01/14 0955 Last data filed at 11/01/14 0557  Gross per 24  hour  Intake    240 ml  Output    500 ml  Net   -260 ml   Filed Weights   10/30/14 0500 10/31/14 0448 11/01/14 0536  Weight: 75.1 kg (165 lb 9.1 oz) 72.3 kg (159 lb 6.3 oz) 75 kg (165 lb 5.5 oz)    General  appearance: alert, cooperative, In no distress. Resp: improved air entry bilaterally. No crackles or wheezing. No change in examination Cardio: regular rate and rhythm, S1, S2 normal, no murmur, click, rub or gallop GI: soft, non-tender; bowel sounds normal; no masses,  no organomegaly Neurologic: Alert. Somewhat distracted. Oriented to person, place. No obvious neurological deficits noted.  Lab Results:  Basic Metabolic Panel:  Recent Labs Lab 10/28/14 0350 10/29/14 0713 10/30/14 0431 10/31/14 0519 11/01/14 0453  NA 134* 133* 133* 135 134*  K 3.5 3.8 3.4* 3.3* 3.7  CL 96 94* 94* 95* 94*  CO2 29 30 32 33* 34*  GLUCOSE 102* 111* 105* 97 99  BUN 17 24* 27* 24* 19  CREATININE 1.21 1.42* 1.42* 1.36* 1.30  CALCIUM 8.8 8.4 7.8* 8.2* 8.2*   Liver Function Tests:  Recent Labs Lab 10/27/14 1552 10/28/14 0350  AST 19 17  ALT 13 11  ALKPHOS 63 56  BILITOT 0.8 0.4  PROT 6.5 5.7*  ALBUMIN 3.8 3.3*    Recent Labs Lab 10/27/14 1552  LIPASE 26   CBC:  Recent Labs Lab 10/27/14 1552  10/30/14 0431 10/30/14 2142 10/31/14 0519 10/31/14 1705 11/01/14 0453  WBC 9.7  < > 12.8* 8.8 9.2 8.3 8.7  NEUTROABS 7.3  --   --   --   --   --   --   HGB 10.5*  < > 7.5* 9.3* 9.3* 9.6* 8.9*  HCT 33.4*  < > 23.9* 29.3* 29.0* 30.1* 28.5*  MCV 86.8  < > 87.2 86.4 87.6 87.2 88.2  PLT 421*  < > 340 334 337 373 305  < > = values in this interval not displayed. Cardiac Enzymes:  Recent Labs Lab 10/27/14 2116 10/28/14 0254 10/28/14 0948  TROPONINI 0.15* 0.34* 0.19*   BNP (last 3 results)  Recent Labs  10/27/14 1557 10/28/14 0320  BNP 859.9* 1327.8*    Studies/Results: No results found.  Medications:  Scheduled: . docusate sodium  300 mg Oral Daily  . DULoxetine  30 mg Oral Daily  . erythromycin  1 application Both Eyes QHS  . furosemide  40 mg Oral Daily  . furosemide  40 mg Oral Once  . gabapentin  1,800 mg Oral QHS  . pantoprazole  40 mg Oral Daily  . polyethylene  glycol  17 g Oral Daily  . senna-docusate  3 tablet Oral QHS  . sodium chloride  3 mL Intravenous Q12H  . sodium chloride  3 mL Intravenous Q12H  . tamsulosin  0.4 mg Oral Daily   Continuous:  GNF:AOZHYQ chloride, sodium chloride, acetaminophen, ALPRAZolam, morphine injection, ondansetron (ZOFRAN) IV, oxyCODONE-acetaminophen, sodium chloride, sodium chloride, zolpidem  Assessment/Plan:  Active Problems:   Calcific aortic stenosis   CHF (congestive heart failure)   Abnormal CT of the abdomen   Hypoxia   CAP (community acquired pneumonia)   Acute respiratory failure with hypoxia   Elevated troponin   Normocytic anemia   Bladder neoplasm   Acute blood loss anemia   Hematochezia    Acute respiratory failure with Hypoxia Much improved. This was most likely secondary to acute systolic congestive heart failure. Echocardiogram report as above. EF  is 25-30%. Patient's respiratory status has improved with diuretics. Oxygen to maintain sats greater than 90%.  Hematochezia  Appears to have subsided. Patient has had multiple episodes of bloody stools. This was likely triggered by anticoagulants and possibly diverticular in origin. Hemoglobin remained stable after transfusion of 2 units. Appreciate gastroenterology input. No plans for procedures. He had a similar presentation in 2014. He had a colonoscopy at that time which revealed hemorrhoids and diverticulosis.  Acute blood loss anemia Hemoglobin improved after blood transfusion.   Acute systolic congestive heart failure with severe AS Echocardiogram reveals a EF of 25-30%. This is likely secondary to his aortic stenosis. Cardiology is following. Continue Lasix. He has improved from a symptom standpoint. He is not a candidate for any aggressive intervention such as surgery or TAVR. He might even be a candidate for hospice. Palliative medicine input is pending. Meeting is scheduled for this afternoon.  Minimally elevated troponin EKG shows  a nonspecific ST changes. This is most likely demand ischemia. Echocardiogram does show some wall motion abnormalities. Again, patient not a candidate for any further workup. Aspirin discontinued due to his rectal bleeding.  Questionable community-acquired pneumonia Considering all of the above pneumonia is unlikely. Antibiotics were discontinued.   Possible urinary bladder mass Discussed with urology at the time of admission. They recommended outpatient management. Continue Foley catheter as patient did have some retention when he initially presented. Patient has a remote history of prostate cancer. Once again, unlikely he is a candidate for workup considering all of his other comorbidities. He will need to be discharged with his Foley catheter. He will need to be seen by urology as an outpatient. I have contacted Dr. Dr. Arlyn Leak office. His office will set up outpatient follow-up.  History of dementia Mental status at baseline.  DVT Prophylaxis: SCDs Code Status: DO NOT RESUSCITATE  Family Communication: Discussed with the patient Disposition Plan: Await palliative medicine input. Will need to go to skilled nursing facility when ready for discharge. Anticipate discharge tomorrow.     LOS: 5 days   [facility] Pager [phone] 11/01/2014, 9:55 AM  If 7PM-7AM, please contact night-coverage at [email or web address], password [facility]

## 2014-11-01 NOTE — Progress Notes (Signed)
SLP Cancellation Note  Patient Details Name: Justin Deleon MRN: [id number] DOB: 18-Jul-1925   Cancelled treatment:       Reason Eval/Treat Not Completed: Other (comment) (order cancelled, please reorder if desire)   Luanna Salk, [facility] SLP [phone]

## 2014-11-01 NOTE — Progress Notes (Addendum)
Patient Name: Justin Deleon Date of Encounter: 11/01/2014     Active Problems:   Calcific aortic stenosis   CHF (congestive heart failure)   Abnormal CT of the abdomen   Hypoxia   CAP (community acquired pneumonia)   Acute respiratory failure with hypoxia   Elevated troponin   Normocytic anemia   Bladder neoplasm   Acute blood loss anemia   Hematochezia    SUBJECTIVE  Patient denies chest discomfort, he has improved SOB.  He is still passing blood in his stool but the amount and frequency has declined.  Received 2 units of PRBC over the weekend.  His echocardiogram  shows poor left ventricular function in the face of severe aortic stenosis.  His blood pressure remains soft which is multifactorial including his severe aortic stenosis and his anemia.  CURRENT MEDS . docusate sodium  300 mg Oral Daily  . DULoxetine  30 mg Oral Daily  . erythromycin  1 application Both Eyes QHS  . furosemide  40 mg Oral Daily  . gabapentin  1,800 mg Oral QHS  . pantoprazole  40 mg Oral Daily  . polyethylene glycol  17 g Oral Daily  . senna-docusate  3 tablet Oral QHS  . sodium chloride  3 mL Intravenous Q12H  . sodium chloride  3 mL Intravenous Q12H  . tamsulosin  0.4 mg Oral Daily   OBJECTIVE  Filed Vitals:   10/31/14 1443 10/31/14 1957 11/01/14 0217 11/01/14 0536  BP: 107/66 106/69 102/60 101/47  Pulse: 84 88 68 76  Temp: 97.7 F (36.5 C)  97.5 F (36.4 C) 97.9 F (36.6 C)  TempSrc: Oral  Oral Oral  Resp: 18 18 18 18   Height:      Weight:    165 lb 5.5 oz (75 kg)  SpO2: 100% 98% 97% 98%    Intake/Output Summary (Last 24 hours) at 11/01/14 0902 Last data filed at 11/01/14 0557  Gross per 24 hour  Intake    480 ml  Output    500 ml  Net    -20 ml   Filed Weights   10/30/14 0500 10/31/14 0448 11/01/14 0536  Weight: 165 lb 9.1 oz (75.1 kg) 159 lb 6.3 oz (72.3 kg) 165 lb 5.5 oz (75 kg)    PHYSICAL EXAM  General: Pleasant, NAD.  Confused.  He is sitting up in a chair  this morning and tolerating well.  Very alert and cooperative. Lungs:  Resp regular and unlabored, CTA. Heart: Regular sinus rhythm.  Grade 3/6 harsh systolic ejection murmur of aortic stenosis widely heard across precordium. Abdomen: Soft, non-tender, non-distended, BS + x 4.  Extremities: No clubbing, cyanosis or edema. DP/PT/Radials 2+ and equal bilaterally.  Accessory Clinical Findings  CBC  Recent Labs  10/31/14 1705 11/01/14 0453  WBC 8.3 8.7  HGB 9.6* 8.9*  HCT 30.1* 28.5*  MCV 87.2 88.2  PLT 373 109   Basic Metabolic Panel  Recent Labs  10/31/14 0519 11/01/14 0453  NA 135 134*  K 3.3* 3.7  CL 95* 94*  CO2 33* 34*  GLUCOSE 97 99  BUN 24* 19  CREATININE 1.36* 1.30  CALCIUM 8.2* 8.2*   TELE  Normal sinus rhythm, PVCs  Radiology/Studies  Dg Chest 2 View  10/27/2014   CLINICAL DATA:  Chest pain and short of breath  EXAM: CHEST  2 VIEW  COMPARISON:  03/19/2012.  FINDINGS: Bilateral airspace disease, perihilar predominance. Small bilateral pleural effusions with bibasilar atelectasis. Cardiac enlargement. Previously the  lungs are clear.  IMPRESSION: Congestive heart failure with edema and small effusions.      ASSESSMENT AND PLAN 1.  Acute on chronic diastolic heart failure.  Renal function stable on low dose Lasix. I would give him additional Lasix today in the afternoon. If additional transfusions planned please administer extra lasix 40 mg po after each unit.  2.  Severe aortic stenosis.  Not a surgical candidate for surgery or TAVR. 3.  Severe dementia 4.  Bladder mass possible cancer 5.  Hematochezia.  History of diverticulosis.  GI is following.  Hemoglobin mildly dropped overnight  Plan:   Continue conservative management.  Palliative care consult pending.   Signed, Dorothy Spark MD

## 2014-11-01 NOTE — Progress Notes (Signed)
Patient ID: Justin Deleon, male   DOB: 1925/10/15, 81 y.o.   MRN: [id number]   Consultation Note Date: 11/01/2014   Patient Name: Justin Deleon  DOB: [birth year]  MRN: [id number]  Age / Sex: 81 y.o., male   PCP: Justin Dooms, MD Referring Physician: Bonnielee Haff, MD  Reason for Consultation: Disposition, Establishing goals of care and Pain control  Palliative Care Assessment and Plan Summary of Established Goals of Care and Medical Treatment Preferences    Palliative Care Discussion Held Today Contacts/Participants in Discussion: Primary Decision Maker: [name]  HCPOA: yes  Daughter Justin Deleon and [name] also present for meeting  Code Status/Advance Care Planning:  DNR  I gave them a MOST form to consider completing  Symptom Management:   He is on relatively high doses of PRN oxycodone- he got 90 mg in the past 24 hours- pain mostly in his knees and from neuropathy, no abdominal pain.  Started Oxycontin 20 BID with OxyIR 10 q4 prn for breakthrough pain  Severe Anxiety/Sundowning: Per family he does best with alprazolam-will schedule his dose 0.25 TID and give a PRN PM dose for sleep  Continue home dose of Cymbalta and gabapentin for neuropathy  Palliative Prophylaxis: He was on 2-3 laxative scheduled- I cut these back given multiple bloody stools.  Psycho-social/Spiritual:   Support System: Sons and from [facility]  Desire for further Chaplaincy support:no  Prognosis: < 6 months  Discharge Planning:  [place] for rehab with Palliative care service follow-up- educated family on transition to hospice if rehab not working out       Risk analyst Complaint/History of Present Illness: 81 yo with severe Aortic Stenosis and Diverticular bleed with newly diagnosed Bladder neoplasm. Chronic neuropathic pain has been an issue.  Primary Diagnoses  Present on Admission:  . Abnormal CT of the abdomen . Hypoxia . CAP (community acquired pneumonia) .  Acute respiratory failure with hypoxia . Elevated troponin . Normocytic anemia   I have reviewed the medical record, interviewed the patient and family, and examined the patient. The following aspects are pertinent.  Past Medical History  Diagnosis Date  . Depressed   . Hernia   . Kidney stones   . Hypertension   . Prostate cancer   . Cataract 2012    Mohs MD  . Hypertrophy of prostate with urinary obstruction and other lower urinary tract symptoms (LUTS) 08/26/2012  . Edema 08/26/2012  . Ectropion, unspecified 05/27/2012  . Abnormality of gait 11/27/2011  . Unspecified hereditary and idiopathic peripheral neuropathy 08/28/2011  . Basal cell carcinoma of scalp and skin of neck 06/26/2011  . Pain in limb 05/28/2011  . Closed fracture of midcervical section of femur 05/28/2011  . Other and unspecified hyperlipidemia 04/06/2011  . Anemia, unspecified 04/06/2011  . Peripheral vascular disease, unspecified 04/06/2011  . Reflux esophagitis 04/06/2011  . Unspecified constipation 04/06/2011  . Insomnia, unspecified 04/06/2011  . Undiagnosed cardiac murmurs 04/06/2011  . Hypertrophy of prostate with urinary obstruction and other lower urinary tract symptoms (LUTS) 2013  . Depression, acute   . Aortic valve stenosis, rheumatic   . Kidney stones    History   Social History  . Marital Status: Widowed    Spouse Name: N/A  . Number of Children: 3  . Years of Education: college   Occupational History  . retired    Social History Deleon Topics  . Smoking status: Former Smoker    Types: Cigars    Quit date:  12/10/1987  . Smokeless tobacco: Never Used  . Alcohol Use: 0.6 oz/week    1 Cans of beer per week     Comment: 1 week  . Drug Use: No  . Sexual Activity: No   Other Topics Concern  . None   Social History Narrative   Lives at [place] with walker   Former smoker stopped 1989   Alcohol on can of beer a week    Exercise goes to [employer] 3  days a week, upper body     DNR, POA   Patient is right handed.   Patient drinks 2 cups of caffeine a day.            Family History  Problem Relation Age of Onset  . Heart disease Father     MI  . Cancer Sister     bladder   Scheduled Meds: . ALPRAZolam  0.25 mg Oral TID  . [START ON 11/02/2014] docusate sodium  200 mg Oral QHS  . DULoxetine  30 mg Oral Daily  . erythromycin  1 application Both Eyes QHS  . furosemide  40 mg Oral Daily  . furosemide  40 mg Oral Once  . gabapentin  1,800 mg Oral QHS  . OxyCODONE  20 mg Oral Q12H  . [START ON 11/02/2014] pantoprazole  40 mg Oral QHS  . senna-docusate  1 tablet Oral QHS  . sodium chloride  3 mL Intravenous Q12H  . sodium chloride  3 mL Intravenous Q12H  . tamsulosin  0.4 mg Oral Daily   Continuous Infusions:  PRN Meds:.sodium chloride, sodium chloride, acetaminophen, ALPRAZolam, ondansetron (ZOFRAN) IV, oxyCODONE, polyethylene glycol, sodium chloride, sodium chloride Medications Prior to Admission:  Prior to Admission medications   Medication Sig Start Date End Date Taking? Authorizing Provider  docusate sodium (COLACE) 100 MG capsule Take 300 mg by mouth daily.   Yes Historical Provider, MD  DULoxetine (CYMBALTA) 30 MG capsule One daily to help depression and pains Patient taking differently: Take 30 mg by mouth daily.  10/12/14  Yes Justin Dooms, MD  erythromycin ophthalmic ointment Place 1 application into both eyes at bedtime.   Yes Historical Provider, MD  gabapentin (NEURONTIN) 300 MG capsule Take 6 capsules at night to relieve leg pains Patient taking differently: Take 1,800-2,400 mg by mouth at bedtime.  10/26/14  Yes Justin Dooms, MD  HYDROcodone-acetaminophen (NORCO/VICODIN) 5-325 MG per tablet Take 1 tablet up to 4 times daily for knee pain Patient taking differently: Take 1 tablet by mouth 4 (four) times daily as needed for moderate pain or severe pain.  10/26/14  Yes Justin Dooms, MD  Multiple Vitamins-Minerals  (MULTIVITAMIN WITH MINERALS) tablet Take 1 tablet by mouth daily.    Yes Historical Provider, MD  Multiple Vitamins-Minerals (PRESERVISION AREDS) CAPS Take 2 capsules by mouth daily. In morning   Yes Historical Provider, MD  omeprazole (PRILOSEC) 20 MG capsule Take 20 mg by mouth daily.   Yes Historical Provider, MD  Polyethyl Glycol-Propyl Glycol (SYSTANE) 0.4-0.3 % SOLN Place 1 drop into both eyes 4 (four) times daily as needed (dry eyes). One drop 4 times daily as needed for dry eyes   Yes Historical Provider, MD  polyethylene glycol (MIRALAX / GLYCOLAX) packet Take 17 g by mouth daily.   Yes Historical Provider, MD  Red Yeast Rice 600 MG TABS Take 3 tablets by mouth daily. In morning   Yes Historical Provider, MD  senna-docusate (SENOKOT-S) 8.6-50 MG per tablet Take 3 tablets by mouth at bedtime.   Yes Historical Provider, MD  tamsulosin (FLOMAX) 0.4 MG CAPS capsule TAKE ONE CAPSULE BY MOUTH EVERY DAY. 07/19/14  Yes Justin Dooms, MD  Vitamin D, Cholecalciferol, 1000 UNITS TABS 2 daily for vitamin D supplementation Patient taking differently: Take 1,000 mg by mouth daily.  06/09/13  Yes Justin Dooms, MD  zolpidem (AMBIEN) 10 MG tablet TAKE 1 TABLET BY MOUTH DAILY AT BEDTIME FOR SLEEP Patient taking differently: TAKE 1 TABLET BY MOUTH DAILY AT BEDTIME AS NEEDED FOR SLEEP 10/18/14  Yes Tiffany L Reed, DO   No Known Allergies CBC:    Component Value Date/Time   WBC 8.7 11/01/2014 0453   WBC 8.3 02/22/2014   WBC 9.1 03/19/2012 1203   HGB 8.9* 11/01/2014 0453   HGB 11.0* 03/19/2012 1203   HCT 28.5* 11/01/2014 0453   HCT 35.8* 03/19/2012 1203   PLT 305 11/01/2014 0453   MCV 88.2 11/01/2014 0453   MCV 89.4 03/19/2012 1203   NEUTROABS 7.3 10/27/2014 1552   LYMPHSABS 1.2 10/27/2014 1552   MONOABS 1.2* 10/27/2014 1552   EOSABS 0.1 10/27/2014 1552   BASOSABS 0.0 10/27/2014 1552   Comprehensive Metabolic Panel:    Component Value Date/Time   NA 134* 11/01/2014 0453   NA 139 08/02/2014    K 3.7 11/01/2014 0453   CL 94* 11/01/2014 0453   CO2 34* 11/01/2014 0453   BUN 19 11/01/2014 0453   BUN 22* 08/02/2014   CREATININE 1.30 11/01/2014 0453   CREATININE 1.4* 08/02/2014   GLUCOSE 99 11/01/2014 0453   CALCIUM 8.2* 11/01/2014 0453   AST 17 10/28/2014 0350   ALT 11 10/28/2014 0350   ALKPHOS 56 10/28/2014 0350   BILITOT 0.4 10/28/2014 0350   PROT 5.7* 10/28/2014 0350   ALBUMIN 3.3* 10/28/2014 0350    Physical Exam: Vital Signs: BP 106/56 mmHg  Pulse 76  Temp(Src) 98.2 F (36.8 C) (Oral)  Resp 16  Ht 5\' 11"  (1.803 m)  Wt 75 kg (165 lb 5.5 oz)  BMI 23.07 kg/m2  SpO2 97% SpO2: SpO2: 97 % O2 Device: O2 Device: Nasal Cannula O2 Flow Rate: O2 Flow Rate (L/min): 2 L/min Intake/output summary:  Intake/Output Summary (Last 24 hours) at 11/01/14 1514 Last data filed at 11/01/14 1336  Gross per 24 hour  Intake    480 ml  Output    925 ml  Net   -445 ml   LBM:   Baseline Weight: Weight: 77.2 kg (170 lb 3.1 oz) Most recent weight: Weight: 75 kg (165 lb 5.5 oz)  Exam Findings: Looks more lethargic today- reports pain in his legs is worse Cooperative and conversant.         Palliative Performance Scale:  50%           Additional Data Reviewed: Recent Labs     10/31/14  0519  10/31/14  1705  11/01/14  0453  WBC  9.2  8.3  8.7  HGB  9.3*  9.6*  8.9*  PLT  337  373  305  NA  135   --   134*  BUN  24*   --   19  CREATININE  1.36*   --   1.30     Time In: 2PM  Time Out: 3:20PM Time Total: 80 min  Greater than 50%  of this time was spent counseling and coordinating care related to the above assessment and plan.  Signed by: Roma Schanz, DO  11/01/2014, 3:14 PM  Please contact Palliative Medicine Team phone at [phone] for questions and concerns.

## 2014-11-01 NOTE — Progress Notes (Signed)
Subjective: No further reported bleeding. No abdominal pain.  Objective: Vital signs in last 24 hours: Temp:  [97.5 F (36.4 C)-97.9 F (36.6 C)] 97.9 F (36.6 C) (04/25 0536) Pulse Rate:  [68-88] 76 (04/25 0536) Resp:  [18] 18 (04/25 0536) BP: (101-107)/(47-69) 101/47 mmHg (04/25 0536) SpO2:  [97 %-100 %] 98 % (04/25 0536) Weight:  [75 kg (165 lb 5.5 oz)] 75 kg (165 lb 5.5 oz) (04/25 0536) Weight change: 2.7 kg (5 lb 15.2 oz) Last BM Date: 10/31/14 (bloody, red, stool)  PE: GEN:  Elderly, somewhat chronically frail-appearing, NAD ABD:  Soft, non-tender, no periotonitis  Lab Results: CBC    Component Value Date/Time   WBC 8.7 11/01/2014 0453   WBC 8.3 02/22/2014   WBC 9.1 03/19/2012 1203   RBC 3.23* 11/01/2014 0453   RBC 3.29* 04/17/2013 1600   RBC 4.00* 03/19/2012 1203   HGB 8.9* 11/01/2014 0453   HGB 11.0* 03/19/2012 1203   HCT 28.5* 11/01/2014 0453   HCT 35.8* 03/19/2012 1203   PLT 305 11/01/2014 0453   MCV 88.2 11/01/2014 0453   MCV 89.4 03/19/2012 1203   MCH 27.6 11/01/2014 0453   MCH 27.5 03/19/2012 1203   MCHC 31.2 11/01/2014 0453   MCHC 30.7* 03/19/2012 1203   RDW 17.2* 11/01/2014 0453   LYMPHSABS 1.2 10/27/2014 1552   MONOABS 1.2* 10/27/2014 1552   EOSABS 0.1 10/27/2014 1552   BASOSABS 0.0 10/27/2014 1552   CMP     Component Value Date/Time   NA 134* 11/01/2014 0453   NA 139 08/02/2014   K 3.7 11/01/2014 0453   CL 94* 11/01/2014 0453   CO2 34* 11/01/2014 0453   GLUCOSE 99 11/01/2014 0453   BUN 19 11/01/2014 0453   BUN 22* 08/02/2014   CREATININE 1.30 11/01/2014 0453   CREATININE 1.4* 08/02/2014   CALCIUM 8.2* 11/01/2014 0453   PROT 5.7* 10/28/2014 0350   ALBUMIN 3.3* 10/28/2014 0350   AST 17 10/28/2014 0350   ALT 11 10/28/2014 0350   ALKPHOS 56 10/28/2014 0350   BILITOT 0.4 10/28/2014 0350   GFRNONAA 48* 11/01/2014 0453   GFRAA 55* 11/01/2014 0453   Assessment:  1. Hematochezia. Suspect diverticular. No evidence of ongoing acute  bleeding. 2. Anemia, acute blood loss, appropriate Hgb response after transfusion of 2 units. 3. Multiple medical problems.  Plan:  1.  Advance diet as tolerated. 2.  No further GI testing anticipated. 3.  Will sign-off; please call with questions; thank you for the consult.   Landry Dyke 11/01/2014, 1:17 PM

## 2014-11-02 ENCOUNTER — Encounter: Payer: Self-pay | Admitting: Internal Medicine

## 2014-11-02 LAB — BASIC METABOLIC PANEL
ANION GAP: 9 (ref 5–15)
BUN: 18 mg/dL (ref 6–23)
CO2: 36 mmol/L — AB (ref 19–32)
Calcium: 8.6 mg/dL (ref 8.4–10.5)
Chloride: 89 mmol/L — ABNORMAL LOW (ref 96–112)
Creatinine, Ser: 1.22 mg/dL (ref 0.50–1.35)
GFR calc Af Amer: 60 mL/min — ABNORMAL LOW (ref >90)
GFR calc non Af Amer: 51 mL/min — ABNORMAL LOW (ref >90)
GLUCOSE: 91 mg/dL (ref 70–99)
POTASSIUM: 3.4 mmol/L — AB (ref 3.5–5.1)
SODIUM: 134 mmol/L — AB (ref 135–145)

## 2014-11-02 LAB — CBC
HEMATOCRIT: 30.9 % — AB (ref 39.0–52.0)
HEMOGLOBIN: 9.6 g/dL — AB (ref 13.0–17.0)
MCH: 27.8 pg (ref 26.0–34.0)
MCHC: 31.1 g/dL (ref 30.0–36.0)
MCV: 89.6 fL (ref 78.0–100.0)
Platelets: 384 10*3/uL (ref 150–400)
RBC: 3.45 MIL/uL — ABNORMAL LOW (ref 4.22–5.81)
RDW: 17.2 % — AB (ref 11.5–15.5)
WBC: 7.7 10*3/uL (ref 4.0–10.5)

## 2014-11-02 MED ORDER — OXYCODONE HCL 10 MG PO TABS: 10.0000 mg | ORAL_TABLET | ORAL | Status: AC | PRN

## 2014-11-02 MED ORDER — OXYCODONE HCL ER 20 MG PO T12A: 20.0000 mg | EXTENDED_RELEASE_TABLET | Freq: Two times a day (BID) | ORAL | Status: AC

## 2014-11-02 MED ORDER — FUROSEMIDE 40 MG PO TABS: 40.0000 mg | ORAL_TABLET | Freq: Every day | ORAL | Status: AC

## 2014-11-02 MED ORDER — ALPRAZOLAM 0.25 MG PO TABS: ORAL_TABLET | ORAL | Status: AC

## 2014-11-02 MED ORDER — POTASSIUM CHLORIDE CRYS ER 20 MEQ PO TBCR: 20.0000 meq | EXTENDED_RELEASE_TABLET | Freq: Every day | ORAL | Status: AC

## 2014-11-02 MED ORDER — POTASSIUM CHLORIDE CRYS ER 20 MEQ PO TBCR
40.0000 meq | EXTENDED_RELEASE_TABLET | Freq: Once | ORAL | Status: AC
  Administered 2014-11-02: 40 meq via ORAL
  Filled 2014-11-02: qty 2

## 2014-11-02 NOTE — Progress Notes (Signed)
Pt transferred to [facility] via [facility] at this time. Pt in stable condition, dtr in possession of pt's belongings. Transfer crew in possession of transfer packet.

## 2014-11-02 NOTE — Progress Notes (Signed)
Patient is set to discharge to [facility] SN[facility] today. Patient & daughter, Lelon Frohlich at bedside aware. Discharge packet given to RN, Elzie Rings. [facility] called for transport.     Raynaldo Opitz, [facility] Clinical Social Worker cell #: [phone]

## 2014-11-02 NOTE — Progress Notes (Signed)
Report called to [place] at [facility]. All questions answered, contact number provided. Pt and family aware of transfer and agreeable.

## 2014-11-02 NOTE — Discharge Summary (Signed)
[facility]  Physician Discharge Summary   Patient ID: Justin Deleon MRN: [id number] DOB/AGE: [birth year] 81 y.o.  Admit date: 10/27/2014 Discharge date: 11/02/2014  PCP: Estill Dooms, MD  DISCHARGE DIAGNOSES:  Active Problems:   Calcific aortic stenosis   CHF (congestive heart failure)   Abnormal CT of the abdomen   Hypoxia   CAP (community acquired pneumonia)   Acute respiratory failure with hypoxia   Elevated troponin   Normocytic anemia   Bladder neoplasm   Acute blood loss anemia   Hematochezia   Neuropathic pain   Palliative care by specialist   RECOMMENDATIONS FOR OUTPATIENT FOLLOW UP: 1. Patient's urologist, Dr. Arlyn Leak office will call to schedule appointment for bladder mass and recommendations regarding Foley catheter. 2. Please leave Foley catheter in until urologist has seen. 3. Palliative medicine to continue following at skilled nursing facility.  DISCHARGE CONDITION: fair  Diet recommendation: Regular as tolerated  Filed Weights   10/31/14 0448 11/01/14 0536 11/02/14 0449  Weight: 72.3 kg (159 lb 6.3 oz) 75 kg (165 lb 5.5 oz) 71.8 kg (158 lb 4.6 oz)    INITIAL HISTORY: 81 year old Caucasian male with a past medical history that is quite significant and mentioned below, presented with generalized weakness. He was found to be short of breath. He was complaining of abdominal pain. Chest x-ray suggested congestive heart failure versus pneumonia. He was admitted for further management. He was managed for congestive heart failure. EF is 25-30%. Severe aortic stenosis was detected. He then developed rectal bleeding. He was transfused 2 units of blood.   Consultants: Cardiology, urology over the phone, gastroenterology  Procedures:  2-D echocardiogram Study Conclusions - Left ventricle: The cavity size was normal. Wall thickness wasincreased in a pattern of mild LVH. Systolic function wasseverely reduced. The estimated ejection fraction  was in therange of 25% to 30%. There is hypokinesis of themid-apicalanteroseptal and apical myocardium. The study is nottechnically sufficient to allow evaluation of LV diastolicfunction. Doppler parameters are consistent with high ventricularfilling pressure. - Aortic valve: There was severe stenosis. There was mildregurgitation. Valve area (VTI): 1.47 cm^2. Valve area (Vmax):1.71 cm^2. Valve area (Vmean): 1.59 cm^2. - Mitral valve: There was mild regurgitation. - Left atrium: The atrium was moderately dilated. - Pulmonary arteries: PA peak pressure: 39 mm Hg (S). - Pericardium, extracardiac: There was a left pleural effusion. Impressions: - The aortic valve stenosis appears to be severe visually. Sincethe prior study of 04/17/13, the gradients across the valve havedecreased likely secondary to development of poor LV systolicfunction since prior echo.  HOSPITAL COURSE:   Acute respiratory failure with Hypoxia This was most likely secondary to acute systolic congestive heart failure. Echocardiogram report as above. EF is 25-30%. Patient's respiratory status has improved with diuretics. Oxygen to maintain sats greater than 90%.  Hematochezia  Appears to have subsided. Patient had multiple episodes of bloody stools. This was likely triggered by anticoagulants and possibly diverticular in origin. Hemoglobin remained stable after transfusion of 2 units. She was seen by gastroenterology and their recommendations were followed. No plans for procedures. He had a similar presentation in 2014. He had a colonoscopy at that time which revealed hemorrhoids and diverticulosis.  Acute blood loss anemia Hemoglobin improved after blood transfusion.   Acute systolic congestive heart failure with severe AS Echocardiogram reveals a EF of 25-30%. This is likely secondary to his aortic stenosis. Cardiology was consulted. Patient was started on Lasix. He is much improved. He is not a candidate for any  aggressive intervention  such as surgery or TAVR. He might even be a candidate for hospice. Palliative medicine has seen the patient. They recommend continued follow-up at skilled nursing facility.   Minimally elevated troponin EKG showed nonspecific ST changes. This is most likely demand ischemia. Echocardiogram does show some wall motion abnormalities. Again, patient not a candidate for any further workup. Aspirin discontinued due to his rectal bleeding.  Questionable community-acquired pneumonia There was initially a suspicion for pneumonia. Patient was started on antibiotics. But as more information became available, pneumonia was thought to be less likely and antibiotics were subsequently discontinued.   Possible urinary bladder mass Discussed with urology at the time of admission. They recommended outpatient management. Continue Foley catheter as patient did have some retention when he initially presented. Patient has a remote history of prostate cancer. Once again, unlikely he is a candidate for workup considering all of his other comorbidities. He will need to be discharged with his Foley catheter. He will need to be seen by urology as an outpatient. I have contacted Dr. Dr. Arlyn Leak office. His office will set up outpatient follow-up.  History of dementia Mental status at baseline.  Chronic pain syndrome Palliative medicine. Did adjust his pain medicines. He was started on long-acting OxyContin. This will be continued at the skilled nursing facility.  Overall, patient is stable. Pain is very well controlled. No further episodes of bleeding. His breathing is much better. He is okay for discharge to SNF.   PERTINENT LABS:  The results of significant diagnostics from this hospitalization (including imaging, microbiology, ancillary and laboratory) are listed below for reference.    Labs: Basic Metabolic Panel:  Recent Labs Lab 10/29/14 0713 10/30/14 0431 10/31/14 0519  11/01/14 0453 11/02/14 0412  NA 133* 133* 135 134* 134*  K 3.8 3.4* 3.3* 3.7 3.4*  CL 94* 94* 95* 94* 89*  CO2 30 32 33* 34* 36*  GLUCOSE 111* 105* 97 99 91  BUN 24* 27* 24* 19 18  CREATININE 1.42* 1.42* 1.36* 1.30 1.22  CALCIUM 8.4 7.8* 8.2* 8.2* 8.6   Liver Function Tests:  Recent Labs Lab 10/27/14 1552 10/28/14 0350  AST 19 17  ALT 13 11  ALKPHOS 63 56  BILITOT 0.8 0.4  PROT 6.5 5.7*  ALBUMIN 3.8 3.3*    Recent Labs Lab 10/27/14 1552  LIPASE 26   CBC:  Recent Labs Lab 10/27/14 1552  10/31/14 0519 10/31/14 1705 11/01/14 0453 11/01/14 1730 11/02/14 0412  WBC 9.7  < > 9.2 8.3 8.7 8.0 7.7  NEUTROABS 7.3  --   --   --   --   --   --   HGB 10.5*  < > 9.3* 9.6* 8.9* 9.7* 9.6*  HCT 33.4*  < > 29.0* 30.1* 28.5* 31.0* 30.9*  MCV 86.8  < > 87.6 87.2 88.2 88.1 89.6  PLT 421*  < > 337 373 305 367 384  < > = values in this interval not displayed. Cardiac Enzymes:  Recent Labs Lab 10/27/14 2116 10/28/14 0254 10/28/14 0948  TROPONINI 0.15* 0.34* 0.19*   BNP: BNP (last 3 results)  Recent Labs  10/27/14 1557 10/28/14 0320  BNP 859.9* 1327.8*    IMAGING STUDIES Dg Chest 2 View  10/27/2014   CLINICAL DATA:  Chest pain and short of breath  EXAM: CHEST  2 VIEW  COMPARISON:  03/19/2012.  FINDINGS: Bilateral airspace disease, perihilar predominance. Small bilateral pleural effusions with bibasilar atelectasis. Cardiac enlargement. Previously the lungs are clear.  IMPRESSION: Congestive heart failure  with edema and small effusions.   Electronically Signed   By: Franchot Gallo M.D.   On: 10/27/2014 16:30   Ct Abdomen Pelvis W Contrast  10/27/2014   CLINICAL DATA:  Lower abdominal pain for 3 weeks  EXAM: CT ABDOMEN AND PELVIS WITH CONTRAST  TECHNIQUE: Multidetector CT imaging of the abdomen and pelvis was performed using the standard protocol following bolus administration of intravenous contrast.  CONTRAST:  82mL OMNIPAQUE IOHEXOL 300 MG/ML SOLN, 152mL OMNIPAQUE  IOHEXOL 300 MG/ML SOLN  COMPARISON:  None.  FINDINGS: Bilateral pleural effusions are noted with bibasilar infiltrate.  The gallbladder has been surgically removed. The liver, spleen and pancreas are within normal limits. The adrenal glands are enlarged bilaterally likely related hypertrophy. No definitive mass lesion is seen.  The kidneys are well visualized bilaterally and demonstrate bilateral renal cystic change. This is much worse on the left than the right with at least 2 dominant cysts. These measure 8.1 cm and 10.8 cm. Delayed images demonstrate normal excretion of contrast material. Aortoiliac calcifications are seen.  Small anterior abdominal wall hernia is noted adjacent to the umbilicus containing fat. No herniation of bowel loops within is noted. The prostate shows multiple brachytherapy seeds. The bladder is partially distended. A irregular enhancing lesion is noted arising from the inferior aspect of the urinary bladder. This is consistent with a bladder neoplasm until proven otherwise. No significant pelvic lymphadenopathy is identified.  The bony structures show changes of prior left hip pinning. Degenerative changes of the lumbar spine are seen.  IMPRESSION: Changes suggestive of bladder mass. Direct visualization is recommended.  Bilateral lower lobe infiltrates with associated effusions.  Bilateral renal cystic change.  Fat containing periumbilical hernia.   Electronically Signed   By: Inez Catalina M.D.   On: 10/27/2014 17:49    DISCHARGE EXAMINATION: Filed Vitals:   11/01/14 0536 11/01/14 1333 11/01/14 2045 11/02/14 0449  BP: 101/47 106/56 102/59 105/59  Pulse: 76 76 85 71  Temp: 97.9 F (36.6 C) 98.2 F (36.8 C) 97.9 F (36.6 C) 98.2 F (36.8 C)  TempSrc: Oral Oral Oral Oral  Resp: 18 16 16 16   Height:      Weight: 75 kg (165 lb 5.5 oz)   71.8 kg (158 lb 4.6 oz)  SpO2: 98% 97% 98% 96%   General appearance: alert, cooperative, appears stated age and no distress Resp: clear to  auscultation bilaterally Cardio: regular rate and rhythm, S1, S2 normal, no murmur, click, rub or gallop GI: soft, non-tender; bowel sounds normal; no masses,  no organomegaly Extremities: extremities normal, atraumatic, no cyanosis or edema  DISPOSITION: SNF  Discharge Instructions    Call MD for:  difficulty breathing, headache or visual disturbances    Complete by:  As directed      Call MD for:  extreme fatigue    Complete by:  As directed      Call MD for:  persistant dizziness or light-headedness    Complete by:  As directed      Call MD for:  persistant nausea and vomiting    Complete by:  As directed      Call MD for:  severe uncontrolled pain    Complete by:  As directed      Call MD for:  temperature >100.4    Complete by:  As directed      Diet - low sodium heart healthy    Complete by:  As directed      Discharge instructions  Complete by:  As directed   Palliative medicine to continue to follow at SNF. Dr. Arlyn Leak (Urologist) office will contact regarding appointment for bladder mass. Leave foley in till seen by urologist.  Dennis Bast were cared for by a hospitalist during your hospital stay. If you have any questions about your discharge medications or the care you received while you were in the hospital after you are discharged, you can call the unit and asked to speak with the hospitalist on call if the hospitalist that took care of you is not available. Once you are discharged, your primary care physician will handle any further medical issues. Please note that NO REFILLS for any discharge medications will be authorized once you are discharged, as it is imperative that you return to your primary care physician (or establish a relationship with a primary care physician if you do not have one) for your aftercare needs so that they can reassess your need for medications and monitor your lab values. If you do not have a primary care physician, you can call [phone] for a  physician referral.     Increase activity slowly    Complete by:  As directed            ALLERGIES: No Known Allergies   Current Discharge Medication List    START taking these medications   Details  ALPRAZolam (XANAX) 0.25 MG tablet 1 Tablet three times daily scheduled and one at bedtime as needed for anxiety Qty: 30 tablet, Refills: 0    furosemide (LASIX) 40 MG tablet Take 1 tablet (40 mg total) by mouth daily. Qty: 30 tablet    OxyCODONE (OXYCONTIN) 20 mg T12A 12 hr tablet Take 1 tablet (20 mg total) by mouth every 12 (twelve) hours. Qty: 60 tablet, Refills: 0    oxyCODONE 10 MG TABS Take 1 tablet (10 mg total) by mouth every 4 (four) hours as needed for breakthrough pain. Qty: 30 tablet, Refills: 0    potassium chloride SA (K-DUR,KLOR-CON) 20 MEQ tablet Take 1 tablet (20 mEq total) by mouth daily.      CONTINUE these medications which have NOT CHANGED   Details  docusate sodium (COLACE) 100 MG capsule Take 300 mg by mouth daily.    DULoxetine (CYMBALTA) 30 MG capsule One daily to help depression and pains Qty: 30 capsule, Refills: 3   Associated Diagnoses: Pain in both knees; Depression (emotion); Hereditary and idiopathic peripheral neuropathy    erythromycin ophthalmic ointment Place 1 application into both eyes at bedtime.    gabapentin (NEURONTIN) 300 MG capsule Take 6 capsules at night to relieve leg pains   Associated Diagnoses: Pain of lower leg, unspecified laterality    Multiple Vitamins-Minerals (MULTIVITAMIN WITH MINERALS) tablet Take 1 tablet by mouth daily.     omeprazole (PRILOSEC) 20 MG capsule Take 20 mg by mouth daily.    Polyethyl Glycol-Propyl Glycol (SYSTANE) 0.4-0.3 % SOLN Place 1 drop into both eyes 4 (four) times daily as needed (dry eyes). One drop 4 times daily as needed for dry eyes    polyethylene glycol (MIRALAX / GLYCOLAX) packet Take 17 g by mouth daily.    Red Yeast Rice 600 MG TABS Take 3 tablets by mouth daily. In morning      senna-docusate (SENOKOT-S) 8.6-50 MG per tablet Take 3 tablets by mouth at bedtime.    tamsulosin (FLOMAX) 0.4 MG CAPS capsule TAKE ONE CAPSULE BY MOUTH EVERY DAY. Qty: 90 capsule, Refills: 1    Vitamin D, Cholecalciferol, 1000  UNITS TABS 2 daily for vitamin D supplementation Qty: 100 tablet, Refills: 5   Associated Diagnoses: Unspecified vitamin D deficiency    zolpidem (AMBIEN) 10 MG tablet TAKE 1 TABLET BY MOUTH DAILY AT BEDTIME FOR SLEEP Qty: 30 tablet, Refills: 0      STOP taking these medications     HYDROcodone-acetaminophen (NORCO/VICODIN) 5-325 MG per tablet      Multiple Vitamins-Minerals (PRESERVISION AREDS) CAPS        Follow-up Information    Follow up with Ailene Rud, MD.   Specialty:  Urology   Why:  His office will contact you for follow-up regarding the bladder mass.   Contact information:   [address] [place] [address] [phone]       Follow up with GREEN, Viviann Spare, MD. Schedule an appointment as soon as possible for a visit in 1 week.   Specialty:  Internal Medicine   Why:  post hospitalization follow up   Contact information:   [place] [id number] [phone]       TOTAL DISCHARGE TIME: 83 minutes  [facility] Pager [phone]  11/02/2014, 11:19 AM

## 2014-11-05 ENCOUNTER — Encounter: Payer: Self-pay | Admitting: Internal Medicine

## 2014-11-05 ENCOUNTER — Non-Acute Institutional Stay (SKILLED_NURSING_FACILITY): Payer: Medicare Other | Admitting: Internal Medicine

## 2014-11-05 DIAGNOSIS — F329 Major depressive disorder, single episode, unspecified: Secondary | ICD-10-CM | POA: Diagnosis not present

## 2014-11-05 DIAGNOSIS — I5031 Acute diastolic (congestive) heart failure: Secondary | ICD-10-CM

## 2014-11-05 DIAGNOSIS — M25561 Pain in right knee: Secondary | ICD-10-CM

## 2014-11-05 DIAGNOSIS — D494 Neoplasm of unspecified behavior of bladder: Secondary | ICD-10-CM

## 2014-11-05 DIAGNOSIS — R531 Weakness: Secondary | ICD-10-CM

## 2014-11-05 DIAGNOSIS — M25562 Pain in left knee: Secondary | ICD-10-CM | POA: Diagnosis not present

## 2014-11-05 DIAGNOSIS — I739 Peripheral vascular disease, unspecified: Secondary | ICD-10-CM

## 2014-11-05 DIAGNOSIS — G609 Hereditary and idiopathic neuropathy, unspecified: Secondary | ICD-10-CM

## 2014-11-05 DIAGNOSIS — I5021 Acute systolic (congestive) heart failure: Secondary | ICD-10-CM | POA: Diagnosis not present

## 2014-11-05 DIAGNOSIS — I350 Nonrheumatic aortic (valve) stenosis: Secondary | ICD-10-CM

## 2014-11-05 DIAGNOSIS — F32A Depression, unspecified: Secondary | ICD-10-CM

## 2014-11-05 DIAGNOSIS — K921 Melena: Secondary | ICD-10-CM | POA: Diagnosis not present

## 2014-11-05 DIAGNOSIS — R0902 Hypoxemia: Secondary | ICD-10-CM | POA: Diagnosis not present

## 2014-11-05 NOTE — Progress Notes (Signed)
Patient ID: Justin Deleon, male   DOB: 02-17-1926, 81 y.o.   MRN: [id number]    HISTORY AND PHYSICAL  Location:  [facility] Room Number: N39 Place of Service: SNF (31)   Extended Emergency Contact Information Primary Emergency Contact: Otelia Sergeant, [place] Work Phone: [phone] Mobile Phone: [phone] Relation: Son Secondary Emergency Contact: Wilnette K[place] Phone: [phone] Work Phone: [phone] Mobile Phone: [phone] Relation: Daughter  Advanced Directive information Does patient have an advance directive?: Yes, Type of Advance Directive: Healthcare Power of [place];Living will;Out of facility DNR (pink MOST or yellow form), Pre-existing out of facility DNR order (yellow form or pink MOST form): Yellow form placed in chart (order not valid for inpatient use) (At [facility]), Does patient want to make changes to advanced directive?: No - Patient declined  Chief Complaint  Patient presents with  . New Admit To SNF    Following hospitalization    HPI:   New Admission to skilled nursing facility 11/02/2014 following hospitalization. Hospitalized from 10/27/2014 through 11/02/2014. Patient entered the hospital dyspneic and Make. He was found to have congestive heart failure. Initially there was a possibility considered of community-acquired pneumonia, but ultimately it was felt that he did not have this problem. He did have acute respiratory failure with hypoxia. There was a mildly elevated troponin.  While hospitalized he had hematochezia. He had previously had colonoscopy in 2014. The only thing it showed was diverticuli. He did have hemorrhoids. GI bleeding stopped while in the hospital. The most likely source is either a diverticular bleed or a hemorrhoidal bleed. No further workup of this was anticipated.  In the course of evaluating things, a CT scan of the  abdomen was done. Head showed a new finding of a bladder mass. Patient has had prostate cancer with seed implant in 2002 by Dr. Gaynelle Arabian. Further evaluation of the mass was to be scheduled at Dr. Arlyn Leak office as an outpatient. I discussed this issue with his daughter today. She sent a message had been left on his answering machine in his apartment. She plans to follow-up on getting him the appointment.  Echocardiogram showed a significantly diminished left ventricular output at 25-30%. He also had a severe aortic stenosis. Patient is not felt to be a safe candidate for surgical repair of the severe aortic stenosis.  Acute blood loss anemia with a low hemoglobin noted during the hospital stay.  Patient has a complicated problem of leg pains. One of the sources of pain is definitely his knees bilaterally. It is felt that this is related to degenerative osteoarthritis. He also has some evidence of peripheral circulatory compromise. He also has what is felt to be a peripheral neuropathy. Recent office visits showed that the gabapentin that he had initially taken for this was no longer as effective as it had been in the past. He also was having increasing leg edema that was thought to be related to his gabapentin use (up to 1800 mg every night) he also complained of drowsiness during the day which was felt to be related to the gabapentin. He was started on Cymbalta a little less than one month ago. Pain seemed to be improving on this except for the knees.  Patient was in the skilled nursing facility now for strengthening, instruction in safe mobility, and improved self-care skills. He is engaged with physical therapy and occupational therapy.  Past Medical History  Diagnosis Date  . Depressed   . Hernia   . Kidney stones   . Hypertension   . Prostate cancer   . Cataract 2012    Mohs MD  . Hypertrophy of prostate with urinary obstruction and other lower urinary tract symptoms (LUTS) 08/26/2012    . Edema 08/26/2012  . Ectropion, unspecified 05/27/2012  . Abnormality of gait 11/27/2011  . Unspecified hereditary and idiopathic peripheral neuropathy 08/28/2011  . Basal cell carcinoma of scalp and skin of neck 06/26/2011  . Pain in limb 05/28/2011  . Closed fracture of midcervical section of femur 05/28/2011  . Other and unspecified hyperlipidemia 04/06/2011  . Anemia, unspecified 04/06/2011  . Peripheral vascular disease, unspecified 04/06/2011  . Reflux esophagitis 04/06/2011  . Unspecified constipation 04/06/2011  . Insomnia, unspecified 04/06/2011  . Undiagnosed cardiac murmurs 04/06/2011  . Hypertrophy of prostate with urinary obstruction and other lower urinary tract symptoms (LUTS) 2013  . Depression, acute   . Aortic valve stenosis, rheumatic   . Kidney stones   . Severe aortic stenosis 03/07/2012  . Bladder neoplasm   . Hematochezia   . Hypoxia 10/27/2014    Past Surgical History  Procedure Laterality Date  . Cholecystectomy  1963/1978  . Hernia repair  6789    umbilical  . Spine surgery  2010    lumbar-L3-4/L4-5  . Fracture surgery Left 03/2011    hip  . Eye surgery      retina repair right  . Esophagogastroduodenoscopy N/A 04/17/2013    Procedure: ESOPHAGOGASTRODUODENOSCOPY (EGD);  Surgeon: Wonda Horner, MD;  Location: [facility] ENDOSCOPY;  Service: Endoscopy;  Laterality: N/A;  . Colonoscopy N/A 04/21/2013    Procedure: COLONOSCOPY;  Surgeon: Jeryl Columbia, MD;  Location: [facility] ENDOSCOPY;  Service: Endoscopy;  Laterality: N/A;  . Skin cancer excision  11/10/13    2 lesions removed back of neck Dr. Nevada Crane  . Skin cancer excision  03/23/14    below left ear    Patient Care Team: Estill Dooms, MD as PCP - General (Geriatric Medicine) [place] Man Mast X, NP as Nurse Practitioner (Nurse Practitioner)  History   Social History  . Marital Status: Widowed    Spouse Name: N/A  . Number of Children: 3  . Years of Education: college   Occupational History  . retired     Social History Main Topics  . Smoking status: Former Smoker    Types: Cigars    Quit date: 12/10/1987  . Smokeless tobacco: Never Used  . Alcohol Use: 0.6 oz/week    1 Cans of beer per week     Comment: 1 week  . Drug Use: No  . Sexual Activity: No   Other Topics Concern  . Not on file   Social History Narrative   Lives at [facility] with walker   Former smoker stopped 1989   Alcohol on can of beer a week    Exercise goes to [employer] 3 days a week, upper body     DNR, POA   Patient is right handed.   Patient drinks 2 cups of caffeine a day.              reports that he quit smoking about 26 years ago. His smoking use included Cigars. He has never used smokeless tobacco. He reports that he drinks about 0.6 oz of alcohol per week. He reports that he does not  use illicit drugs.  Family History  Problem Relation Age of Onset  . Heart disease Father     MI  . Cancer Sister     bladder   Family Status  Relation Status Death Age  . Father Deceased 74  . Sister Deceased 67  . Daughter Alive   . Son Alive   . Son Alive   . Mother Deceased 37    "old age"  . Sister Deceased 21    dementia  . Sister Alive     Immunization History  Administered Date(s) Administered  . Influenza Whole 04/08/2012, 04/09/2013  . Influenza-Unspecified 04/22/2014  . Pneumococcal Conjugate-13 07/09/2004  . Td 03/30/2011  . Tdap 07/09/2004    No Known Allergies  Medications: Patient's Medications  New Prescriptions   No medications on file  Previous Medications   ALPRAZOLAM (XANAX) 0.25 MG TABLET    1 Tablet three times daily scheduled and one at bedtime as needed for anxiety   DOCUSATE SODIUM (COLACE) 100 MG CAPSULE    Take 300 mg by mouth daily.   DULOXETINE (CYMBALTA) 30 MG CAPSULE    One daily to help depression and pains   ERYTHROMYCIN OPHTHALMIC OINTMENT    Place 1 application into both eyes at bedtime.   FUROSEMIDE (LASIX) 40 MG TABLET     Take 1 tablet (40 mg total) by mouth daily.   GABAPENTIN (NEURONTIN) 300 MG CAPSULE    Take 6 capsules at night to relieve leg pains   MULTIPLE VITAMINS-MINERALS (MULTIVITAMIN WITH MINERALS) TABLET    Take 1 tablet by mouth daily.    OMEPRAZOLE (PRILOSEC) 20 MG CAPSULE    Take 20 mg by mouth daily.   OXYCODONE (OXYCONTIN) 20 MG T12A 12 HR TABLET    Take 1 tablet (20 mg total) by mouth every 12 (twelve) hours.   OXYCODONE 10 MG TABS    Take 1 tablet (10 mg total) by mouth every 4 (four) hours as needed for breakthrough pain.   POLYETHYL GLYCOL-PROPYL GLYCOL (SYSTANE) 0.4-0.3 % SOLN    Place 1 drop into both eyes 4 (four) times daily as needed (dry eyes). One drop 4 times daily as needed for dry eyes   POLYETHYLENE GLYCOL (MIRALAX / GLYCOLAX) PACKET    Take 17 g by mouth daily.   POTASSIUM CHLORIDE SA (K-DUR,KLOR-CON) 20 MEQ TABLET    Take 1 tablet (20 mEq total) by mouth daily.   RED YEAST RICE 600 MG TABS    Take 3 tablets by mouth daily. In morning   SENNA-DOCUSATE (SENOKOT-S) 8.6-50 MG PER TABLET    Take 3 tablets by mouth at bedtime.   TAMSULOSIN (FLOMAX) 0.4 MG CAPS CAPSULE    TAKE ONE CAPSULE BY MOUTH EVERY DAY.   VITAMIN D, CHOLECALCIFEROL, 1000 UNITS TABS    2 daily for vitamin D supplementation   ZOLPIDEM (AMBIEN) 10 MG TABLET    TAKE 1 TABLET BY MOUTH DAILY AT BEDTIME FOR SLEEP  Modified Medications   No medications on file  Discontinued Medications   No medications on file    Review of Systems  Constitutional: Negative for fever, chills, diaphoresis, activity change, appetite change, fatigue and unexpected weight change.  HENT: Positive for hearing loss and rhinorrhea.   Eyes: Negative.        Corrective lenses  Respiratory: Negative for apnea, cough, choking, chest tightness and shortness of breath.        Currently on oxygen, but being weaned from it.  Cardiovascular: Negative for  chest pain, palpitations and leg swelling.  Gastrointestinal: Negative.   Endocrine: Negative.    Genitourinary: Negative.   Musculoskeletal: Positive for back pain, arthralgias and gait problem.       Tender at both knees. No effusion or crepitance.  Skin: Negative for color change, pallor and wound.       AK on hands.  Skin cancers removed by Dr. Nevada Crane on the left postauricular area and the posterior neck.  Neurological: Negative.   Hematological: Bruises/bleeds easily.  Psychiatric/Behavioral: Positive for confusion and dysphoric mood. Negative for behavioral problems, sleep disturbance and agitation. The patient is not nervous/anxious.     Filed Vitals:   11/05/14 1800  BP: 122/71  Pulse: 72  Temp: 97.8 F (36.6 C)  Resp: 16  Height: 5\' 11"  (1.803 m)  Weight: 158 lb (71.668 kg)   Body mass index is 22.05 kg/(m^2).  Physical Exam  Constitutional: He is oriented to person, place, and time. No distress.  Frail elderly male. Weight is significantly less than at the time of his last office visit. I attribute this to aggressive diuresis to control his congestive heart failure during his last hospitalization.  HENT:  Nose: Nose normal.  Mouth/Throat: No oropharyngeal exudate.  Bilateral loss of hearing. Rhinorrhea.  Eyes:  Corrective lenses  Neck: Normal range of motion. Neck supple. No JVD present. No tracheal deviation present. No thyromegaly present.  Cardiovascular: Normal rate and regular rhythm.  Exam reveals no gallop and no friction rub.   Murmur heard. Grade 3/6 cooing ejection murmur  Pulmonary/Chest: No respiratory distress. He has no wheezes. He has no rales.  Abdominal: Soft. Bowel sounds are normal. He exhibits no distension and no mass. There is no tenderness.  Musculoskeletal: Normal range of motion. He exhibits tenderness (both knees). He exhibits no edema.  Lymphadenopathy:    He has no cervical adenopathy.  Neurological: He is alert and oriented to person, place, and time. No cranial nerve deficit. Coordination normal.  04/14/13 MMSE: 29/30. Failed clock  drawing.  Skin: No rash (at neck) noted. No erythema. No pallor.  Actinic keratosis.  Skin cancers removed from left postauricular area and posterior neck.  Psychiatric: His behavior is normal. Thought content normal.  Some loss of memory. Poor historian. Mild depression.     Labs reviewed: Admission on 10/27/2014, Discharged on 11/02/2014  No results displayed because visit has over 200 results.    Admission on 10/09/2014, Discharged on 10/09/2014  Component Date Value Ref Range Status  . Color, Urine 10/09/2014 YELLOW  YELLOW Final  . APPearance 10/09/2014 CLEAR  CLEAR Final  . Specific Gravity, Urine 10/09/2014 1.025  1.005 - 1.030 Final  . pH 10/09/2014 5.5  5.0 - 8.0 Final  . Glucose, UA 10/09/2014 NEGATIVE  NEGATIVE mg/dL Final  . Hgb urine dipstick 10/09/2014 NEGATIVE  NEGATIVE Final  . Bilirubin Urine 10/09/2014 NEGATIVE  NEGATIVE Final  . Ketones, ur 10/09/2014 NEGATIVE  NEGATIVE mg/dL Final  . Protein, ur 10/09/2014 NEGATIVE  NEGATIVE mg/dL Final  . Urobilinogen, UA 10/09/2014 0.2  0.0 - 1.0 mg/dL Final  . Nitrite 10/09/2014 NEGATIVE  NEGATIVE Final  . Leukocytes, UA 10/09/2014 NEGATIVE  NEGATIVE Final   MICROSCOPIC NOT DONE ON URINES WITH NEGATIVE PROTEIN, BLOOD, LEUKOCYTES, NITRITE, OR GLUCOSE <1000 mg/dL.  Nursing Home on 08/10/2014  Component Date Value Ref Range Status  . Glucose 08/02/2014 78   Final  . BUN 08/02/2014 22* 4 - 21 mg/dL Final  . Creatinine 08/02/2014 1.4* 0.6 - 1.3 mg/dL  Final  . Potassium 08/02/2014 4.7  3.4 - 5.3 mmol/L Final  . Sodium 08/02/2014 139  137 - 147 mmol/L Final  . LDl/HDL Ratio 08/02/2014 2.8   Final  . Triglycerides 08/02/2014 75  40 - 160 mg/dL Final  . Cholesterol 08/02/2014 179  0 - 200 mg/dL Final  . HDL 08/02/2014 65  35 - 70 mg/dL Final  . LDL Cholesterol 08/02/2014 99   Final  . Alkaline Phosphatase 08/02/2014 48  25 - 125 U/L Final  . ALT 08/02/2014 13  10 - 40 U/L Final  . AST 08/02/2014 17  14 - 40 U/L Final  .  Bilirubin, Total 08/02/2014 0.7   Final    Dg Chest 2 View  10/27/2014   CLINICAL DATA:  Chest pain and short of breath  EXAM: CHEST  2 VIEW  COMPARISON:  03/19/2012.  FINDINGS: Bilateral airspace disease, perihilar predominance. Small bilateral pleural effusions with bibasilar atelectasis. Cardiac enlargement. Previously the lungs are clear.  IMPRESSION: Congestive heart failure with edema and small effusions.   Electronically Signed   By: Franchot Gallo M.D.   On: 10/27/2014 16:30   Ct Abdomen Pelvis W Contrast  10/27/2014   CLINICAL DATA:  Lower abdominal pain for 3 weeks  EXAM: CT ABDOMEN AND PELVIS WITH CONTRAST  TECHNIQUE: Multidetector CT imaging of the abdomen and pelvis was performed using the standard protocol following bolus administration of intravenous contrast.  CONTRAST:  61mL OMNIPAQUE IOHEXOL 300 MG/ML SOLN, 149mL OMNIPAQUE IOHEXOL 300 MG/ML SOLN  COMPARISON:  None.  FINDINGS: Bilateral pleural effusions are noted with bibasilar infiltrate.  The gallbladder has been surgically removed. The liver, spleen and pancreas are within normal limits. The adrenal glands are enlarged bilaterally likely related hypertrophy. No definitive mass lesion is seen.  The kidneys are well visualized bilaterally and demonstrate bilateral renal cystic change. This is much worse on the left than the right with at least 2 dominant cysts. These measure 8.1 cm and 10.8 cm. Delayed images demonstrate normal excretion of contrast material. Aortoiliac calcifications are seen.  Small anterior abdominal wall hernia is noted adjacent to the umbilicus containing fat. No herniation of bowel loops within is noted. The prostate shows multiple brachytherapy seeds. The bladder is partially distended. A irregular enhancing lesion is noted arising from the inferior aspect of the urinary bladder. This is consistent with a bladder neoplasm until proven otherwise. No significant pelvic lymphadenopathy is identified.  The bony  structures show changes of prior left hip pinning. Degenerative changes of the lumbar spine are seen.  IMPRESSION: Changes suggestive of bladder mass. Direct visualization is recommended.  Bilateral lower lobe infiltrates with associated effusions.  Bilateral renal cystic change.  Fat containing periumbilical hernia.   Electronically Signed   By: Inez Catalina M.D.   On: 10/27/2014 17:49     Assessment/Plan  1. Acute systolic congestive heart failure Compensated with more aggressive diuresis. Situation is complicated by his severe aortic stenosis and cardiomyopathy with a low ejection fraction of about 25%. I have told both the patient and his daughter he is at risk for recurrent episodes of congestive heart failure.  2. Severe aortic stenosis Not a surgical candidate.  3. Bladder neoplasm To be further assessed by Dr. Gaynelle Arabian. I think the patient could tolerate a cystoscopic exam.  4. Hematochezia Resolved, but probably associated with the anemia.  5. Hypoxia Currently being weaned from oxygen.  6. Acute diastolic heart failure Associated with the systolic congestive heart failure, this caused his  most recent hospitalization.  7. Pain in both knees Degenerative osteoarthritic pains. Currently doing fairly well on OxyContin.  8. Hereditary and idiopathic peripheral neuropathy Continue on Cymbalta 30 mg daily. Gradually wean off, As Tolerated by the Patient.  9. Generalized weakness Needs further therapy for strengthening with physical therapy.  10. Depression (emotion) I think this is improved by the Cymbalta   11. Peripheral vascular disease Diminished pulsations in dorsalis pedis and posterior tibial arteries bilaterally

## 2014-11-08 LAB — BASIC METABOLIC PANEL
BUN: 22 mg/dL — AB (ref 4–21)
Creatinine: 2.3 mg/dL — AB (ref 0.6–1.3)
GLUCOSE: 86 mg/dL
Potassium: 4.3 mmol/L (ref 3.4–5.3)
Sodium: 134 mmol/L — AB (ref 137–147)

## 2014-11-08 LAB — CBC AND DIFFERENTIAL
HCT: 30 % — AB (ref 41–53)
HEMOGLOBIN: 9.4 g/dL — AB (ref 13.5–17.5)
Platelets: 384 10*3/uL (ref 150–399)
WBC: 7.9 10^3/mL

## 2014-11-08 LAB — HEPATIC FUNCTION PANEL
ALT: 10 U/L (ref 10–40)
AST: 21 U/L (ref 14–40)
Alkaline Phosphatase: 54 U/L (ref 25–125)
BILIRUBIN, TOTAL: 0.7 mg/dL

## 2014-11-09 ENCOUNTER — Non-Acute Institutional Stay (SKILLED_NURSING_FACILITY): Payer: Medicare Other | Admitting: Nurse Practitioner

## 2014-11-09 ENCOUNTER — Encounter: Payer: Self-pay | Admitting: Nurse Practitioner

## 2014-11-09 DIAGNOSIS — F32A Depression, unspecified: Secondary | ICD-10-CM

## 2014-11-09 DIAGNOSIS — F329 Major depressive disorder, single episode, unspecified: Secondary | ICD-10-CM | POA: Diagnosis not present

## 2014-11-09 DIAGNOSIS — G609 Hereditary and idiopathic neuropathy, unspecified: Secondary | ICD-10-CM

## 2014-11-09 DIAGNOSIS — K5900 Constipation, unspecified: Secondary | ICD-10-CM | POA: Diagnosis not present

## 2014-11-09 DIAGNOSIS — D62 Acute posthemorrhagic anemia: Secondary | ICD-10-CM | POA: Diagnosis not present

## 2014-11-09 DIAGNOSIS — J309 Allergic rhinitis, unspecified: Secondary | ICD-10-CM | POA: Diagnosis not present

## 2014-11-09 DIAGNOSIS — N182 Chronic kidney disease, stage 2 (mild): Secondary | ICD-10-CM | POA: Diagnosis not present

## 2014-11-09 DIAGNOSIS — I5020 Unspecified systolic (congestive) heart failure: Secondary | ICD-10-CM

## 2014-11-09 DIAGNOSIS — N400 Enlarged prostate without lower urinary tract symptoms: Secondary | ICD-10-CM | POA: Diagnosis not present

## 2014-11-09 DIAGNOSIS — N189 Chronic kidney disease, unspecified: Secondary | ICD-10-CM | POA: Insufficient documentation

## 2014-11-09 DIAGNOSIS — Z8719 Personal history of other diseases of the digestive system: Secondary | ICD-10-CM

## 2014-11-09 NOTE — Assessment & Plan Note (Signed)
11/08/14 Hgb 9.4, continue to observe the patient.

## 2014-11-09 NOTE — Assessment & Plan Note (Addendum)
Managed with Furosemide 40mg  and Kcl 20mg  daily. 11/08/14 Na 134, K 4.3, Bun 22, creatinine 2.3. Continue Enalapril 5mg 

## 2014-11-09 NOTE — Assessment & Plan Note (Signed)
Stable, continue Omeprazole 20mg  dialy.

## 2014-11-09 NOTE — Assessment & Plan Note (Addendum)
Pain is managed with Oxycodone 20mg  bid and Neurontin 900mg  nightly. Prn Oxycodone 10mg  q4h prn available to him as well

## 2014-11-09 NOTE — Progress Notes (Signed)
Patient ID: Justin Deleon, male   DOB: Jan 01, 1926, 81 y.o.   MRN: [id number]   Code Status: DNR  No Known Allergies  Chief Complaint  Patient presents with  . Medical Management of Chronic Issues  . Acute Visit    nasal congestion.     HPI: Patient is a 81 y.o. male seen in the SNF at [facility] today for evaluation of nasal congestion and other chronic medical conditions.   The patient was admitted to SNF [facility] following a complicated hospital stay for CHF, GI bleed, new incidental finding of bladder mass, and chronic leg pain.    Problem List Items Addressed This Visit    Hereditary and idiopathic peripheral neuropathy (Chronic)    Pain is managed with Oxycodone 20mg  bid and Neurontin 900mg  nightly. Prn Oxycodone 10mg  q4h prn available to him as well      Constipation (Chronic)    Managed with Colace 300mg  and MiraLax daily and Senna S III nightly.       Depression (emotion) (Chronic)    Takes Cymbalta 30mg  daily, Na 134 11/08/14. Alprazolam 0.25mg  daily prn available to help relax and sleep at night in addition to 0.25mg  tid and Ambien 10mg  qhs.       Enlarged prostate    No urinary retention, takes 0.4mg  daily.       History of GI bleed    Stable, continue Omeprazole 20mg  dialy.       CHF (congestive heart failure)    Managed with Furosemide 40mg  and Kcl 20mg  daily. 11/08/14 Na 134, K 4.3, Bun 22, creatinine 2.3. Continue Enalapril 5mg        Acute blood loss anemia - Primary    11/08/14 Hgb 9.4, continue to observe the patient.        Chronic renal disease    11/08/14 Bun/creat 22/2.30-diuretic contributory-observe.       Allergic rhinitis    Flonase I spray each nostril daily. Observe.          Review of Systems:  Review of Systems  Constitutional: Negative for fever, chills, diaphoresis, activity change, appetite change, fatigue and unexpected weight change.  HENT: Positive for hearing loss and rhinorrhea.        C/o nasal congestion  Eyes: Negative.         Corrective lenses  Respiratory: Negative for apnea, cough, choking, chest tightness and shortness of breath.        Currently on oxygen, but being weaned from it.  Cardiovascular: Negative for chest pain, palpitations and leg swelling.  Gastrointestinal: Negative.   Endocrine: Negative.   Genitourinary: Negative.   Musculoskeletal: Positive for back pain, arthralgias and gait problem.       Tender at both knees. No effusion or crepitance.  Skin: Negative for color change, pallor and wound.       AK on hands.  Skin cancers removed by Dr. Nevada Crane on the left postauricular area and the posterior neck.  Neurological: Negative.   Hematological: Bruises/bleeds easily.  Psychiatric/Behavioral: Positive for confusion and dysphoric mood. Negative for behavioral problems, sleep disturbance and agitation. The patient is not nervous/anxious.      Past Medical History  Diagnosis Date  . Depressed   . Hernia   . Kidney stones   . Hypertension   . Prostate cancer   . Cataract 2012    Mohs MD  . Hypertrophy of prostate with urinary obstruction and other lower urinary tract symptoms (LUTS) 08/26/2012  . Edema 08/26/2012  .  Ectropion, unspecified 05/27/2012  . Abnormality of gait 11/27/2011  . Unspecified hereditary and idiopathic peripheral neuropathy 08/28/2011  . Basal cell carcinoma of scalp and skin of neck 06/26/2011  . Pain in limb 05/28/2011  . Closed fracture of midcervical section of femur 05/28/2011  . Other and unspecified hyperlipidemia 04/06/2011  . Anemia, unspecified 04/06/2011  . Peripheral vascular disease, unspecified 04/06/2011  . Reflux esophagitis 04/06/2011  . Unspecified constipation 04/06/2011  . Insomnia, unspecified 04/06/2011  . Undiagnosed cardiac murmurs 04/06/2011  . Hypertrophy of prostate with urinary obstruction and other lower urinary tract symptoms (LUTS) 2013  . Depression, acute   . Aortic valve stenosis, rheumatic   . Kidney stones   . Severe aortic  stenosis 03/07/2012  . Bladder neoplasm   . Hematochezia   . Hypoxia 10/27/2014   Past Surgical History  Procedure Laterality Date  . Cholecystectomy  1963/1978  . Hernia repair  9562    umbilical  . Spine surgery  2010    lumbar-L3-4/L4-5  . Fracture surgery Left 03/2011    hip  . Eye surgery      retina repair right  . Esophagogastroduodenoscopy N/A 04/17/2013    Procedure: ESOPHAGOGASTRODUODENOSCOPY (EGD);  Surgeon: Wonda Horner, MD;  Location: [facility] ENDOSCOPY;  Service: Endoscopy;  Laterality: N/A;  . Colonoscopy N/A 04/21/2013    Procedure: COLONOSCOPY;  Surgeon: Jeryl Columbia, MD;  Location: [facility] ENDOSCOPY;  Service: Endoscopy;  Laterality: N/A;  . Skin cancer excision  11/10/13    2 lesions removed back of neck Dr. Nevada Crane  . Skin cancer excision  03/23/14    below left ear   Social History:   reports that he quit smoking about 26 years ago. His smoking use included Cigars. He has never used smokeless tobacco. He reports that he drinks about 0.6 oz of alcohol per week. He reports that he does not use illicit drugs.  Family History  Problem Relation Age of Onset  . Heart disease Father     MI  . Cancer Sister     bladder    Medications: Patient's Medications  New Prescriptions   No medications on file  Previous Medications   ALPRAZOLAM (XANAX) 0.25 MG TABLET    1 Tablet three times daily scheduled and one at bedtime as needed for anxiety   DOCUSATE SODIUM (COLACE) 100 MG CAPSULE    Take 300 mg by mouth daily.   DULOXETINE (CYMBALTA) 30 MG CAPSULE    One daily to help depression and pains   ERYTHROMYCIN OPHTHALMIC OINTMENT    Place 1 application into both eyes at bedtime.   FUROSEMIDE (LASIX) 40 MG TABLET    Take 1 tablet (40 mg total) by mouth daily.   GABAPENTIN (NEURONTIN) 300 MG CAPSULE    Take 6 capsules at night to relieve leg pains   MULTIPLE VITAMINS-MINERALS (MULTIVITAMIN WITH MINERALS) TABLET    Take 1 tablet by mouth daily.    OMEPRAZOLE (PRILOSEC) 20 MG CAPSULE     Take 20 mg by mouth daily.   OXYCODONE (OXYCONTIN) 20 MG T12A 12 HR TABLET    Take 1 tablet (20 mg total) by mouth every 12 (twelve) hours.   OXYCODONE 10 MG TABS    Take 1 tablet (10 mg total) by mouth every 4 (four) hours as needed for breakthrough pain.   POLYETHYL GLYCOL-PROPYL GLYCOL (SYSTANE) 0.4-0.3 % SOLN    Place 1 drop into both eyes 4 (four) times daily as needed (dry eyes). One drop 4 times  daily as needed for dry eyes   POLYETHYLENE GLYCOL (MIRALAX / GLYCOLAX) PACKET    Take 17 g by mouth daily.   POTASSIUM CHLORIDE SA (K-DUR,KLOR-CON) 20 MEQ TABLET    Take 1 tablet (20 mEq total) by mouth daily.   RED YEAST RICE 600 MG TABS    Take 3 tablets by mouth daily. In morning   SENNA-DOCUSATE (SENOKOT-S) 8.6-50 MG PER TABLET    Take 3 tablets by mouth at bedtime.   TAMSULOSIN (FLOMAX) 0.4 MG CAPS CAPSULE    TAKE ONE CAPSULE BY MOUTH EVERY DAY.   VITAMIN D, CHOLECALCIFEROL, 1000 UNITS TABS    2 daily for vitamin D supplementation   ZOLPIDEM (AMBIEN) 10 MG TABLET    TAKE 1 TABLET BY MOUTH DAILY AT BEDTIME FOR SLEEP  Modified Medications   No medications on file  Discontinued Medications   No medications on file     Physical Exam: Physical Exam  Constitutional: He is oriented to person, place, and time. No distress.  Frail elderly male. Weight is significantly less than at the time of his last office visit. I attribute this to aggressive diuresis to control his congestive heart failure during his last hospitalization.  HENT:  Nose: Nose normal.  Mouth/Throat: No oropharyngeal exudate.  Bilateral loss of hearing. Rhinorrhea.  Eyes:  Corrective lenses  Neck: Normal range of motion. Neck supple. No JVD present. No tracheal deviation present. No thyromegaly present.  Cardiovascular: Normal rate and regular rhythm.  Exam reveals no gallop and no friction rub.   Murmur heard. Grade 3/6 cooing ejection murmur  Pulmonary/Chest: No respiratory distress. He has no wheezes. He has no rales.    Abdominal: Soft. Bowel sounds are normal. He exhibits no distension and no mass. There is no tenderness.  Musculoskeletal: Normal range of motion. He exhibits tenderness (both knees). He exhibits no edema.  Lymphadenopathy:    He has no cervical adenopathy.  Neurological: He is alert and oriented to person, place, and time. No cranial nerve deficit. Coordination normal.  04/14/13 MMSE: 29/30. Failed clock drawing.  Skin: No rash (at neck) noted. No erythema. No pallor.  Actinic keratosis.  Skin cancers removed from left postauricular area and posterior neck.  Psychiatric: His behavior is normal. Thought content normal.  Some loss of memory. Poor historian. Mild depression.   Filed Vitals:   11/09/14 1304  BP: 134/76  Pulse: 70  Temp: 97.6 F (36.4 C)  TempSrc: Tympanic  Resp: 18      Labs reviewed: Basic Metabolic Panel:  Recent Labs  10/28/14 0320  10/31/14 0519 11/01/14 0453 11/02/14 0412 11/08/14  NA  --   < > 135 134* 134* 134*  K  --   < > 3.3* 3.7 3.4* 4.3  CL  --   < > 95* 94* 89*  --   CO2  --   < > 33* 34* 36*  --   GLUCOSE  --   < > 97 99 91  --   BUN  --   < > 24* 19 18 22*  CREATININE  --   < > 1.36* 1.30 1.22 2.3*  CALCIUM  --   < > 8.2* 8.2* 8.6  --   TSH 1.283  --   --   --   --   --   < > = values in this interval not displayed. Liver Function Tests:  Recent Labs  10/27/14 1552 10/28/14 0350 11/08/14  AST 19 17 21   ALT 13  11 10  ALKPHOS 63 56 54  BILITOT 0.8 0.4  --   PROT 6.5 5.7*  --   ALBUMIN 3.8 3.3*  --     Recent Labs  10/27/14 1552  LIPASE 26   No results for input(s): AMMONIA in the last 8760 hours. CBC:  Recent Labs  10/27/14 1552  11/01/14 0453 11/01/14 1730 11/02/14 0412 11/08/14  WBC 9.7  < > 8.7 8.0 7.7 7.9  NEUTROABS 7.3  --   --   --   --   --   HGB 10.5*  < > 8.9* 9.7* 9.6* 9.4*  HCT 33.4*  < > 28.5* 31.0* 30.9* 30*  MCV 86.8  < > 88.2 88.1 89.6  --   PLT 421*  < > 305 367 384 384  < > = values in this  interval not displayed. Lipid Panel:  Recent Labs  02/22/14 08/02/14  CHOL 192 179  HDL 63 65  LDLCALC 110 99  TRIG 95 75    Past Procedures:  10/27/14 CT abd with contrast:  IMPRESSION: Changes suggestive of bladder mass. Direct visualization is recommended.  Bilateral lower lobe infiltrates with associated effusions.  Bilateral renal cystic change.  Fat containing periumbilical hernia.   Assessment/Plan Acute blood loss anemia 11/08/14 Hgb 9.4, continue to observe the patient.     History of GI bleed Stable, continue Omeprazole 20mg  dialy.    CHF (congestive heart failure) Managed with Furosemide 40mg  and Kcl 20mg  daily. 11/08/14 Na 134, K 4.3, Bun 22, creatinine 2.3. Continue Enalapril 5mg     Chronic renal disease 11/08/14 Bun/creat 22/2.30-diuretic contributory-observe.    Enlarged prostate No urinary retention, takes 0.4mg  daily.    Constipation Managed with Colace 300mg  and MiraLax daily and Senna S III nightly.    Depression (emotion) Takes Cymbalta 30mg  daily, Na 134 11/08/14. Alprazolam 0.25mg  daily prn available to help relax and sleep at night in addition to 0.25mg  tid and Ambien 10mg  qhs.    Hereditary and idiopathic peripheral neuropathy Pain is managed with Oxycodone 20mg  bid and Neurontin 900mg  nightly. Prn Oxycodone 10mg  q4h prn available to him as well   Allergic rhinitis Flonase I spray each nostril daily. Observe.      Family/ Staff Communication: observe the patient.   Goals of Care: SNF  Labs/tests ordered: none

## 2014-11-09 NOTE — Assessment & Plan Note (Signed)
11/08/14 Bun/creat 22/2.30-diuretic contributory-observe.

## 2014-11-09 NOTE — Assessment & Plan Note (Signed)
Flonase I spray each nostril daily. Observe.

## 2014-11-09 NOTE — Assessment & Plan Note (Addendum)
Takes Cymbalta 30mg  daily, Na 134 11/08/14. Alprazolam 0.25mg  daily prn available to help relax and sleep at night in addition to 0.25mg  tid and Ambien 10mg  qhs.

## 2014-11-09 NOTE — Assessment & Plan Note (Signed)
No urinary retention, takes 0.4mg  daily.

## 2014-11-09 NOTE — Assessment & Plan Note (Addendum)
Managed with Colace 300mg  and MiraLax daily and Senna S III nightly.

## 2014-11-11 ENCOUNTER — Encounter (HOSPITAL_COMMUNITY): Payer: Self-pay

## 2014-11-11 ENCOUNTER — Emergency Department (HOSPITAL_COMMUNITY)
Admission: EM | Admit: 2014-11-11 | Discharge: 2014-11-11 | Disposition: A | Discharge status: Home / Self Care | Payer: Medicare Other | Attending: Emergency Medicine | Admitting: Emergency Medicine

## 2014-11-11 DIAGNOSIS — Z79899 Other long term (current) drug therapy: Secondary | ICD-10-CM | POA: Insufficient documentation

## 2014-11-11 DIAGNOSIS — I10 Essential (primary) hypertension: Secondary | ICD-10-CM | POA: Diagnosis not present

## 2014-11-11 DIAGNOSIS — Z8669 Personal history of other diseases of the nervous system and sense organs: Secondary | ICD-10-CM | POA: Insufficient documentation

## 2014-11-11 DIAGNOSIS — Z862 Personal history of diseases of the blood and blood-forming organs and certain disorders involving the immune mechanism: Secondary | ICD-10-CM | POA: Insufficient documentation

## 2014-11-11 DIAGNOSIS — Z87442 Personal history of urinary calculi: Secondary | ICD-10-CM | POA: Diagnosis not present

## 2014-11-11 DIAGNOSIS — K219 Gastro-esophageal reflux disease without esophagitis: Secondary | ICD-10-CM | POA: Insufficient documentation

## 2014-11-11 DIAGNOSIS — N390 Urinary tract infection, site not specified: Secondary | ICD-10-CM | POA: Diagnosis not present

## 2014-11-11 DIAGNOSIS — Z8546 Personal history of malignant neoplasm of prostate: Secondary | ICD-10-CM | POA: Diagnosis not present

## 2014-11-11 DIAGNOSIS — Z792 Long term (current) use of antibiotics: Secondary | ICD-10-CM | POA: Diagnosis not present

## 2014-11-11 DIAGNOSIS — Z85828 Personal history of other malignant neoplasm of skin: Secondary | ICD-10-CM | POA: Diagnosis not present

## 2014-11-11 DIAGNOSIS — R011 Cardiac murmur, unspecified: Secondary | ICD-10-CM | POA: Diagnosis not present

## 2014-11-11 DIAGNOSIS — Z9049 Acquired absence of other specified parts of digestive tract: Secondary | ICD-10-CM | POA: Diagnosis not present

## 2014-11-11 DIAGNOSIS — Z8551 Personal history of malignant neoplasm of bladder: Secondary | ICD-10-CM | POA: Diagnosis not present

## 2014-11-11 DIAGNOSIS — Z8781 Personal history of (healed) traumatic fracture: Secondary | ICD-10-CM | POA: Diagnosis not present

## 2014-11-11 DIAGNOSIS — F329 Major depressive disorder, single episode, unspecified: Secondary | ICD-10-CM | POA: Diagnosis not present

## 2014-11-11 DIAGNOSIS — F0390 Unspecified dementia without behavioral disturbance: Secondary | ICD-10-CM | POA: Diagnosis not present

## 2014-11-11 DIAGNOSIS — Z87891 Personal history of nicotine dependence: Secondary | ICD-10-CM | POA: Diagnosis not present

## 2014-11-11 DIAGNOSIS — N4889 Other specified disorders of penis: Secondary | ICD-10-CM | POA: Diagnosis present

## 2014-11-11 LAB — URINE MICROSCOPIC-ADD ON

## 2014-11-11 LAB — CBC WITH DIFFERENTIAL/PLATELET
BASOS ABS: 0 10*3/uL (ref 0.0–0.1)
Basophils Relative: 0 % (ref 0–1)
Eosinophils Absolute: 0.1 10*3/uL (ref 0.0–0.7)
Eosinophils Relative: 1 % (ref 0–5)
HEMATOCRIT: 31.6 % — AB (ref 39.0–52.0)
Hemoglobin: 9.4 g/dL — ABNORMAL LOW (ref 13.0–17.0)
LYMPHS ABS: 0.6 10*3/uL — AB (ref 0.7–4.0)
Lymphocytes Relative: 6 % — ABNORMAL LOW (ref 12–46)
MCH: 26.4 pg (ref 26.0–34.0)
MCHC: 29.7 g/dL — ABNORMAL LOW (ref 30.0–36.0)
MCV: 88.8 fL (ref 78.0–100.0)
Monocytes Absolute: 1 10*3/uL (ref 0.1–1.0)
Monocytes Relative: 9 % (ref 3–12)
Neutro Abs: 8.9 10*3/uL — ABNORMAL HIGH (ref 1.7–7.7)
Neutrophils Relative %: 84 % — ABNORMAL HIGH (ref 43–77)
Platelets: 377 10*3/uL (ref 150–400)
RBC: 3.56 MIL/uL — ABNORMAL LOW (ref 4.22–5.81)
RDW: 16.9 % — AB (ref 11.5–15.5)
WBC: 10.6 10*3/uL — AB (ref 4.0–10.5)

## 2014-11-11 LAB — URINALYSIS, ROUTINE W REFLEX MICROSCOPIC
BILIRUBIN URINE: NEGATIVE
GLUCOSE, UA: NEGATIVE mg/dL
Ketones, ur: NEGATIVE mg/dL
Nitrite: NEGATIVE
Protein, ur: NEGATIVE mg/dL
Specific Gravity, Urine: 1.01 (ref 1.005–1.030)
UROBILINOGEN UA: 0.2 mg/dL (ref 0.0–1.0)
pH: 7 (ref 5.0–8.0)

## 2014-11-11 LAB — BASIC METABOLIC PANEL
Anion gap: 9 (ref 5–15)
BUN: 22 mg/dL — AB (ref 6–20)
CO2: 30 mmol/L (ref 22–32)
Calcium: 9.4 mg/dL (ref 8.9–10.3)
Chloride: 94 mmol/L — ABNORMAL LOW (ref 101–111)
Creatinine, Ser: 1.77 mg/dL — ABNORMAL HIGH (ref 0.61–1.24)
GFR calc Af Amer: 38 mL/min — ABNORMAL LOW (ref >60)
GFR calc non Af Amer: 33 mL/min — ABNORMAL LOW (ref >60)
GLUCOSE: 112 mg/dL — AB (ref 70–99)
POTASSIUM: 5.2 mmol/L — AB (ref 3.5–5.1)
SODIUM: 133 mmol/L — AB (ref 135–145)

## 2014-11-11 MED ORDER — CEPHALEXIN 500 MG PO CAPS: 500.0000 mg | ORAL_CAPSULE | Freq: Three times a day (TID) | ORAL | Status: AC

## 2014-11-11 MED ORDER — LIDOCAINE HCL (PF) 1 % IJ SOLN
INTRAMUSCULAR | Status: AC
  Administered 2014-11-11: 19:00:00
  Filled 2014-11-11: qty 5

## 2014-11-11 MED ORDER — CEFTRIAXONE SODIUM 1 G IJ SOLR
1.0000 g | Freq: Once | INTRAMUSCULAR | Status: AC
  Administered 2014-11-11: 1 g via INTRAMUSCULAR
  Filled 2014-11-11: qty 10

## 2014-11-11 MED ORDER — DEXTROSE 5 % IV SOLN: 1.0000 g | Freq: Once | INTRAVENOUS | Status: DC

## 2014-11-11 MED ORDER — OXYBUTYNIN CHLORIDE ER 10 MG PO TB24: 10.0000 mg | ORAL_TABLET | Freq: Every day | ORAL | Status: AC

## 2014-11-11 NOTE — Progress Notes (Signed)
CSW met with pt at bedside. Adult children were present. Patient and children confirm that he is from [facility] and has been living there for the past 5 years.   Patient confirms that he presents tonight due to urethra pain.   Daughter informed CSW that the pt does not fall often. She states that he cannot stand unassisted. Also, pt and daughter confirm that he receives assistance with completing his ADL's.  Daughter states that she and her 2 brothers are the pt's primary support. She says that they have been visiting him daily for the past 2 weeks.   Willette Brace [phone] ED CSW 11/11/2014 9:48 PM

## 2014-11-11 NOTE — ED Notes (Signed)
Per EMS- Patient is a resident of [facility]. Patient c/o pain at the urethra. Patient has an indwelling catheter.

## 2014-11-11 NOTE — Discharge Instructions (Signed)

## 2014-11-11 NOTE — ED Provider Notes (Signed)
CSN: [id number]     Arrival date & time 11/11/14  1457 History   First MD Initiated Contact with Patient 11/11/14 1516     Chief Complaint  Patient presents with  . Penis Pain     (Consider location/radiation/quality/duration/timing/severity/associated sxs/prior Treatment) HPI   Justin Deleon Is an 81 year old male recently discharged from the hospital 2 weeks ago after an episode of CHF exacerbation with pneumonia. He was found at that time to have a bladder neoplasm. After being evaluated for acute urinary retention. He has a remote history of prostate cancer. He also had acute blood loss anemia during his visit and was transfused. The patient is brought in by his daughter today from friend's home [place] after he began complaining of severe pain in his penis and testicles. Patient was given his normal pain medications without relief. He was brought in for further evaluation. Patient is overall been improving since his discharge. No fevers or chills. Level V caveat due to dementia.   Past Medical History  Diagnosis Date  . Depressed   . Hernia   . Kidney stones   . Hypertension   . Prostate cancer   . Cataract 2012    Mohs MD  . Hypertrophy of prostate with urinary obstruction and other lower urinary tract symptoms (LUTS) 08/26/2012  . Edema 08/26/2012  . Ectropion, unspecified 05/27/2012  . Abnormality of gait 11/27/2011  . Unspecified hereditary and idiopathic peripheral neuropathy 08/28/2011  . Basal cell carcinoma of scalp and skin of neck 06/26/2011  . Pain in limb 05/28/2011  . Closed fracture of midcervical section of femur 05/28/2011  . Other and unspecified hyperlipidemia 04/06/2011  . Anemia, unspecified 04/06/2011  . Peripheral vascular disease, unspecified 04/06/2011  . Reflux esophagitis 04/06/2011  . Unspecified constipation 04/06/2011  . Insomnia, unspecified 04/06/2011  . Undiagnosed cardiac murmurs 04/06/2011  . Hypertrophy of prostate with urinary obstruction and other  lower urinary tract symptoms (LUTS) 2013  . Depression, acute   . Aortic valve stenosis, rheumatic   . Kidney stones   . Severe aortic stenosis 03/07/2012  . Bladder neoplasm   . Hematochezia   . Hypoxia 10/27/2014   Past Surgical History  Procedure Laterality Date  . Cholecystectomy  1963/1978  . Hernia repair  6222    umbilical  . Spine surgery  2010    lumbar-L3-4/L4-5  . Fracture surgery Left 03/2011    hip  . Eye surgery      retina repair right  . Esophagogastroduodenoscopy N/A 04/17/2013    Procedure: ESOPHAGOGASTRODUODENOSCOPY (EGD);  Surgeon: Wonda Horner, MD;  Location: [facility] ENDOSCOPY;  Service: Endoscopy;  Laterality: N/A;  . Colonoscopy N/A 04/21/2013    Procedure: COLONOSCOPY;  Surgeon: Jeryl Columbia, MD;  Location: [facility] ENDOSCOPY;  Service: Endoscopy;  Laterality: N/A;  . Skin cancer excision  11/10/13    2 lesions removed back of neck Dr. Nevada Crane  . Skin cancer excision  03/23/14    below left ear   Family History  Problem Relation Age of Onset  . Heart disease Father     MI  . Cancer Sister     bladder   History  Substance Use Topics  . Smoking status: Former Smoker    Types: Cigars    Quit date: 12/10/1987  . Smokeless tobacco: Never Used  . Alcohol Use: 0.6 oz/week    1 Cans of beer per week     Comment: 1 week    Review of Systems  Unable to  perform ROS: Dementia     Allergies  Review of patient's allergies indicates no known allergies.  Home Medications   Prior to Admission medications   Medication Sig Start Date End Date Taking? Authorizing Provider  omeprazole (PRILOSEC) 20 MG capsule Take 20 mg by mouth daily.   Yes Historical Provider, MD  potassium chloride SA (K-DUR,KLOR-CON) 20 MEQ tablet Take 1 tablet (20 mEq total) by mouth daily. 11/02/14  Yes Bonnielee Haff, MD  Red Yeast Rice 600 MG TABS Take 3 tablets by mouth daily. In morning   Yes Historical Provider, MD  tamsulosin (FLOMAX) 0.4 MG CAPS capsule TAKE ONE CAPSULE BY MOUTH EVERY DAY.  07/19/14  Yes Estill Dooms, MD  ALPRAZolam Duanne Moron) 0.25 MG tablet 1 Tablet three times daily scheduled and one at bedtime as needed for anxiety 11/02/14   Bonnielee Haff, MD  docusate sodium (COLACE) 100 MG capsule Take 300 mg by mouth daily.    Historical Provider, MD  DULoxetine (CYMBALTA) 30 MG capsule One daily to help depression and pains Patient taking differently: Take 30 mg by mouth daily.  10/12/14   Estill Dooms, MD  erythromycin ophthalmic ointment Place 1 application into both eyes at bedtime.    Historical Provider, MD  furosemide (LASIX) 40 MG tablet Take 1 tablet (40 mg total) by mouth daily. 11/02/14   Bonnielee Haff, MD  gabapentin (NEURONTIN) 300 MG capsule Take 6 capsules at night to relieve leg pains Patient taking differently: Take 1,800-2,400 mg by mouth at bedtime.  10/26/14   Estill Dooms, MD  Multiple Vitamins-Minerals (MULTIVITAMIN WITH MINERALS) tablet Take 1 tablet by mouth daily.     Historical Provider, MD  OxyCODONE (OXYCONTIN) 20 mg T12A 12 hr tablet Take 1 tablet (20 mg total) by mouth every 12 (twelve) hours. 11/02/14   Bonnielee Haff, MD  oxyCODONE 10 MG TABS Take 1 tablet (10 mg total) by mouth every 4 (four) hours as needed for breakthrough pain. 11/02/14   Bonnielee Haff, MD  Polyethyl Glycol-Propyl Glycol (SYSTANE) 0.4-0.3 % SOLN Place 1 drop into both eyes 4 (four) times daily as needed (dry eyes). One drop 4 times daily as needed for dry eyes    Historical Provider, MD  polyethylene glycol (MIRALAX / GLYCOLAX) packet Take 17 g by mouth daily.    Historical Provider, MD  senna-docusate (SENOKOT-S) 8.6-50 MG per tablet Take 3 tablets by mouth at bedtime.    Historical Provider, MD  Vitamin D, Cholecalciferol, 1000 UNITS TABS 2 daily for vitamin D supplementation Patient taking differently: Take 1,000 mg by mouth daily.  06/09/13   Estill Dooms, MD  zolpidem (AMBIEN) 10 MG tablet TAKE 1 TABLET BY MOUTH DAILY AT BEDTIME FOR SLEEP Patient taking differently: TAKE  1 TABLET BY MOUTH DAILY AT BEDTIME AS NEEDED FOR SLEEP 10/18/14   Tiffany L Reed, DO   BP 97/54 mmHg  Pulse 71  Resp 19  SpO2 95% Physical Exam  Constitutional: He appears well-developed and well-nourished. No distress.  HENT:  Head: Normocephalic and atraumatic.  Eyes: Conjunctivae are normal. No scleral icterus.  Neck: Normal range of motion. Neck supple.  Cardiovascular: Normal rate, regular rhythm and normal heart sounds.   Pulmonary/Chest: Effort normal and breath sounds normal. No respiratory distress.  Abdominal: Soft. Bowel sounds are normal. He exhibits no distension. There is no tenderness.  Genitourinary: Penis normal.  Normal male anatomy. Discharge and smegma  Under the foreskin, does not appear clean. No signs of balanitis. Mild erythema at  the urethral meatus without skin breakdown or discharge. No hematuria, the catheter is draining well with clear urine. Left testicle is very swollen with mild tenderness.  Musculoskeletal: He exhibits no edema.  Neurological: He is alert.  Skin: Skin is warm and dry. He is not diaphoretic.  Psychiatric: His behavior is normal.  Nursing note and vitals reviewed.   ED Course  Procedures (including critical care time) Labs Review Labs Reviewed  URINALYSIS, ROUTINE W REFLEX MICROSCOPIC    Imaging Review No results found.   EKG Interpretation None      MDM   Final diagnoses:  UTI (lower urinary tract infection)   4:22 PM BP 97/54 mmHg  Pulse 71  Resp 19  SpO2 95%  Patient with bladder neoplasm, indwelling foley cath. No pain in the ED. ? Bladder spasm Will change catheter and check urine, basic labs.    6:32 PM BP 94/59 mmHg  Pulse 72  Resp 17  SpO2 96% history urinary tract infection. His systolic pressure is soft, but review of his pressure shows he frequently has systolic pressure in the high 90s to low 100s. . Patient had a urinary tract infection. Urine is sent for culture. Treated here in the emergency  department with IM Rocephin. We'll discharge with Ditropan for bladder spasms. He is asked to follow-up with Dr. Minus Liberty. Seen in shared visit with Dr. Maryan Rued discharged with Keflex by mouth His creatinine is improving. Hemoglobin stable.  Margarita Mail, PA-C 11/13/14 [id number]  Blanchie Dessert, MD 11/13/14 1719

## 2014-11-11 NOTE — ED Notes (Signed)
Attempted x 2 to call report to [facility], but no answer.

## 2014-11-11 NOTE — ED Notes (Signed)
Bed: FK81 Expected date:  Expected time:  Means of arrival:  Comments: EMS- elderly, penile pain, indwelling catheter

## 2014-11-11 NOTE — ED Notes (Signed)
[facility] called for transportation back to [facility].

## 2014-11-15 LAB — URINE CULTURE: Colony Count: >=100000

## 2014-11-16 ENCOUNTER — Non-Acute Institutional Stay (SKILLED_NURSING_FACILITY): Payer: Medicare Other | Admitting: Nurse Practitioner

## 2014-11-16 ENCOUNTER — Encounter: Payer: Self-pay | Admitting: Nurse Practitioner

## 2014-11-16 ENCOUNTER — Telehealth (HOSPITAL_BASED_OUTPATIENT_CLINIC_OR_DEPARTMENT_OTHER): Payer: Self-pay | Admitting: Emergency Medicine

## 2014-11-16 DIAGNOSIS — K5900 Constipation, unspecified: Secondary | ICD-10-CM

## 2014-11-16 DIAGNOSIS — N400 Enlarged prostate without lower urinary tract symptoms: Secondary | ICD-10-CM

## 2014-11-16 DIAGNOSIS — D62 Acute posthemorrhagic anemia: Secondary | ICD-10-CM

## 2014-11-16 DIAGNOSIS — J309 Allergic rhinitis, unspecified: Secondary | ICD-10-CM | POA: Diagnosis not present

## 2014-11-16 DIAGNOSIS — Z8719 Personal history of other diseases of the digestive system: Secondary | ICD-10-CM | POA: Diagnosis not present

## 2014-11-16 DIAGNOSIS — N183 Chronic kidney disease, stage 3 (moderate): Secondary | ICD-10-CM | POA: Diagnosis not present

## 2014-11-16 DIAGNOSIS — C61 Malignant neoplasm of prostate: Secondary | ICD-10-CM

## 2014-11-16 DIAGNOSIS — G609 Hereditary and idiopathic neuropathy, unspecified: Secondary | ICD-10-CM | POA: Diagnosis not present

## 2014-11-16 DIAGNOSIS — N390 Urinary tract infection, site not specified: Secondary | ICD-10-CM | POA: Diagnosis not present

## 2014-11-16 DIAGNOSIS — D494 Neoplasm of unspecified behavior of bladder: Secondary | ICD-10-CM | POA: Diagnosis not present

## 2014-11-16 DIAGNOSIS — F329 Major depressive disorder, single episode, unspecified: Secondary | ICD-10-CM

## 2014-11-16 DIAGNOSIS — I5031 Acute diastolic (congestive) heart failure: Secondary | ICD-10-CM | POA: Diagnosis not present

## 2014-11-16 DIAGNOSIS — F32A Depression, unspecified: Secondary | ICD-10-CM

## 2014-12-08 NOTE — Assessment & Plan Note (Signed)
Takes Oxybutynin 10mg  for symptomatic management.

## 2014-12-08 NOTE — Assessment & Plan Note (Signed)
Foley catheter, takes 0.4mg  daily.

## 2014-12-08 NOTE — Assessment & Plan Note (Signed)
Stable, takes Flonase daily

## 2014-12-08 NOTE — Assessment & Plan Note (Signed)
11/08/14 Bun/creat 22/2.30-diuretic contributory-observe.

## 2014-12-08 NOTE — Assessment & Plan Note (Signed)
11/08/14 Hgb 9.4, continue to observe the patient.

## 2014-12-08 NOTE — Assessment & Plan Note (Signed)
Managed with Colace 300mg  and MiraLax daily and Senna S III nightly.

## 2014-12-08 NOTE — Assessment & Plan Note (Signed)
F/u urology.  

## 2014-12-08 NOTE — Assessment & Plan Note (Signed)
Takes Cymbalta 30mg  daily, Na 134 11/08/14. Alprazolam 0.25mg  daily prn available to help relax and sleep at night in addition to 0.25mg  tid and Ambien 10mg  qhs.

## 2014-12-08 NOTE — Telephone Encounter (Signed)
Post ED Visit - Positive Culture Follow-up  Culture report reviewed by antimicrobial stewardship pharmacist: []  Wes Dulaney, Pharm.D., BCPS [x]  Heide Guile, Pharm.D., BCPS []  Alycia Rossetti, Pharm.D., BCPS []  [place], Pharm.D., BCPS, AAHIVP []  Legrand Como, Pharm.D., BCPS, AAHIVP []  Isac Sarna, Pharm.D., BCPS  Positive urine culture Enterococcus, yeast Treated with cephalexin, recommend stop keflex and start amoxicillin 500mg  po bid x 7 days , results faxed to [facility] @ [phone]  Hazle Nordmann 27-Nov-2014, 10:24 AM

## 2014-12-08 NOTE — Assessment & Plan Note (Addendum)
Compensated clinically, continue Enalapril 5mg  daily, Furosemide 40mg  qd, Kcl 58meq daily.

## 2014-12-08 NOTE — Assessment & Plan Note (Addendum)
Pain is 4/10 today, takes Neurontin 900mg  and Oxycodone 20mg  bid for pain. Prn Oxycodone 10mg  q4h available to him-used daily almost.

## 2014-12-08 NOTE — Progress Notes (Signed)
Patient ID: Justin Deleon, male   DOB: 1925-09-17, 81 y.o.   MRN: [id number]   Code Status: DNR  No Known Allergies  Chief Complaint  Patient presents with  . Medical Management of Chronic Issues  . Acute Visit    UTI    HPI: Patient is a 81 y.o. male seen in the SNF at [facility] today for evaluation of UTI and other chronic medical conditions.   11/11/14 ED eval for pain in lower abd and testicles. He was found to have UTI treated with Keflex-will change to Amoxicillin per urine culture. Foley is in place for urinary retention.  The patient was admitted to SNF [facility] following a complicated hospital stay for CHF, GI bleed, new incidental finding of bladder mass, and chronic leg pain.    Problem List Items Addressed This Visit    Hereditary and idiopathic peripheral neuropathy (Chronic)    Pain is 4/10 today, takes Neurontin 900mg  and Oxycodone 20mg  bid for pain. Prn Oxycodone 10mg  q4h available to him-used daily almost.       Constipation (Chronic)    Managed with Colace 300mg  and MiraLax daily and Senna S III nightly.        Depression (emotion) (Chronic)    Takes Cymbalta 30mg  daily, Na 134 11/08/14. Alprazolam 0.25mg  daily prn available to help relax and sleep at night in addition to 0.25mg  tid and Ambien 10mg  qhs.        Prostate cancer s/p prostatectomy    F/u urology       Enlarged prostate    Foley catheter, takes 0.4mg  daily.        History of GI bleed    Stable, continue Omeprazole 20mg  dialy.       Acute diastolic heart failure    Compensated clinically, continue Enalapril 5mg  daily, Furosemide 40mg  qd, Kcl 38meq daily.       Bladder neoplasm    Takes Oxybutynin 10mg  for symptomatic management.       Acute blood loss anemia    11/08/14 Hgb 9.4, continue to observe the patient.        Chronic renal disease    11/08/14 Bun/creat 22/2.30-diuretic contributory-observe.        Allergic rhinitis    Stable, takes Flonase daily      UTI (urinary  tract infection) - Primary    11/15/14 urinary culture enterococcus-12-10-2014 started 7 day course of Amoxicillin 500mg  bid-will start Nitrofurantoin 50mg  qhs after current Abx tx          Review of Systems:  Review of Systems  Constitutional: Negative for fever, chills, diaphoresis, activity change, appetite change, fatigue and unexpected weight change.  HENT: Positive for hearing loss and rhinorrhea.        C/o nasal congestion  Eyes: Negative.        Corrective lenses  Respiratory: Negative for apnea, cough, choking, chest tightness and shortness of breath.        Currently on oxygen, but being weaned from it.  Cardiovascular: Negative for chest pain, palpitations and leg swelling.  Gastrointestinal: Negative.   Endocrine: Negative.   Genitourinary: Negative.        Foley catheter  Musculoskeletal: Positive for back pain, arthralgias and gait problem.       Tender at both knees. No effusion or crepitance.  Skin: Negative for color change, pallor and wound.       AK on hands.  Skin cancers removed by Dr. Nevada Crane on the left postauricular area and  the posterior neck.  Allergic/Immunologic: Positive for environmental allergies.  Neurological: Negative.   Hematological: Bruises/bleeds easily.  Psychiatric/Behavioral: Positive for confusion and dysphoric mood. Negative for behavioral problems, sleep disturbance and agitation. The patient is not nervous/anxious.      Past Medical History  Diagnosis Date  . Depressed   . Hernia   . Kidney stones   . Hypertension   . Prostate cancer   . Cataract 2012    Mohs MD  . Hypertrophy of prostate with urinary obstruction and other lower urinary tract symptoms (LUTS) 08/26/2012  . Edema 08/26/2012  . Ectropion, unspecified 05/27/2012  . Abnormality of gait 11/27/2011  . Unspecified hereditary and idiopathic peripheral neuropathy 08/28/2011  . Basal cell carcinoma of scalp and skin of neck 06/26/2011  . Pain in limb 05/28/2011  . Closed  fracture of midcervical section of femur 05/28/2011  . Other and unspecified hyperlipidemia 04/06/2011  . Anemia, unspecified 04/06/2011  . Peripheral vascular disease, unspecified 04/06/2011  . Reflux esophagitis 04/06/2011  . Unspecified constipation 04/06/2011  . Insomnia, unspecified 04/06/2011  . Undiagnosed cardiac murmurs 04/06/2011  . Hypertrophy of prostate with urinary obstruction and other lower urinary tract symptoms (LUTS) 2013  . Depression, acute   . Aortic valve stenosis, rheumatic   . Kidney stones   . Severe aortic stenosis 03/07/2012  . Bladder neoplasm   . Hematochezia   . Hypoxia 10/27/2014   Past Surgical History  Procedure Laterality Date  . Cholecystectomy  1963/1978  . Hernia repair  5732    umbilical  . Spine surgery  2010    lumbar-L3-4/L4-5  . Fracture surgery Left 03/2011    hip  . Eye surgery      retina repair right  . Esophagogastroduodenoscopy N/A 04/17/2013    Procedure: ESOPHAGOGASTRODUODENOSCOPY (EGD);  Surgeon: Wonda Horner, MD;  Location: [facility] ENDOSCOPY;  Service: Endoscopy;  Laterality: N/A;  . Colonoscopy N/A 04/21/2013    Procedure: COLONOSCOPY;  Surgeon: Jeryl Columbia, MD;  Location: [facility] ENDOSCOPY;  Service: Endoscopy;  Laterality: N/A;  . Skin cancer excision  11/10/13    2 lesions removed back of neck Dr. Nevada Crane  . Skin cancer excision  03/23/14    below left ear   Social History:   reports that he quit smoking about 26 years ago. His smoking use included Cigars. He has never used smokeless tobacco. He reports that he drinks about 0.6 oz of alcohol per week. He reports that he does not use illicit drugs.  Family History  Problem Relation Age of Onset  . Heart disease Father     MI  . Cancer Sister     bladder    Medications: Patient's Medications  New Prescriptions   No medications on file  Previous Medications   ACETAMINOPHEN (TYLENOL) 325 MG TABLET    Take 650 mg by mouth every 4 (four) hours as needed for mild pain.   ALPRAZOLAM  (XANAX) 0.25 MG TABLET    1 Tablet three times daily scheduled and one at bedtime as needed for anxiety   BENZOCAINE-MENTHOL (CHLORAEPTIC) 6-10 MG LOZENGE    Take 1 lozenge by mouth every 2 (two) hours as needed for sore throat (Dissolve 1 lozenge every 2 hours as needed for sore throat. Notify MD if taking for more than 48hrs).   CEPHALEXIN (KEFLEX) 500 MG CAPSULE    Take 1 capsule (500 mg total) by mouth 3 (three) times daily.   DOCUSATE SODIUM (COLACE) 100 MG CAPSULE    Take  300 mg by mouth every morning.    DULOXETINE (CYMBALTA) 30 MG CAPSULE    One daily to help depression and pains   ENALAPRIL (VASOTEC) 5 MG TABLET    Take 5 mg by mouth daily.   ERYTHROMYCIN OPHTHALMIC OINTMENT    Place 1 application into both eyes at bedtime.   FEEDING SUPPLEMENT (BOOST HIGH PROTEIN) LIQD    Take 1 Container by mouth daily. At lunch to promote skin integrity   FLUTICASONE (FLONASE) 50 MCG/ACT NASAL SPRAY    Place 1 spray into both nostrils daily.   FUROSEMIDE (LASIX) 40 MG TABLET    Take 1 tablet (40 mg total) by mouth daily.   GABAPENTIN (NEURONTIN) 300 MG CAPSULE    Take 6 capsules at night to relieve leg pains   LORATADINE (CLARITIN) 10 MG TABLET    Take 10 mg by mouth daily as needed for allergies or rhinitis. For cold symptoms and runny nose   MULTIPLE VITAMINS-MINERALS (MULTIVITAMIN WITH MINERALS) TABLET    Take 1 tablet by mouth daily.    OMEPRAZOLE (PRILOSEC) 20 MG CAPSULE    Take 20 mg by mouth daily.   OXYBUTYNIN (DITROPAN XL) 10 MG 24 HR TABLET    Take 1 tablet (10 mg total) by mouth at bedtime.   OXYCODONE (OXYCONTIN) 20 MG T12A 12 HR TABLET    Take 1 tablet (20 mg total) by mouth every 12 (twelve) hours.   OXYCODONE 10 MG TABS    Take 1 tablet (10 mg total) by mouth every 4 (four) hours as needed for breakthrough pain.   POLYETHYL GLYCOL-PROPYL GLYCOL (SYSTANE) 0.4-0.3 % SOLN    Place 1 drop into both eyes 4 (four) times daily as needed (dry eyes). One drop 4 times daily as needed for dry eyes    POLYETHYLENE GLYCOL (MIRALAX / GLYCOLAX) PACKET    Take 17 g by mouth daily at 8 pm.    POTASSIUM CHLORIDE SA (K-DUR,KLOR-CON) 20 MEQ TABLET    Take 1 tablet (20 mEq total) by mouth daily.   RED YEAST RICE 600 MG TABS    Take 3 tablets by mouth daily. In morning   SENNA-DOCUSATE (SENOKOT-S) 8.6-50 MG PER TABLET    Take 3 tablets by mouth at bedtime.   TAMSULOSIN (FLOMAX) 0.4 MG CAPS CAPSULE    TAKE ONE CAPSULE BY MOUTH EVERY DAY.   VITAMIN D, CHOLECALCIFEROL, 1000 UNITS TABS    2 daily for vitamin D supplementation   ZOLPIDEM (AMBIEN) 10 MG TABLET    TAKE 1 TABLET BY MOUTH DAILY AT BEDTIME FOR SLEEP  Modified Medications   No medications on file  Discontinued Medications   No medications on file     Physical Exam: Physical Exam  Constitutional: He is oriented to person, place, and time. No distress.  Frail elderly male. Weight is significantly less than at the time of his last office visit. I attribute this to aggressive diuresis to control his congestive heart failure during his last hospitalization.  HENT:  Nose: Nose normal.  Mouth/Throat: No oropharyngeal exudate.  Bilateral loss of hearing. Rhinorrhea.  Eyes:  Corrective lenses  Neck: Normal range of motion. Neck supple. No JVD present. No tracheal deviation present. No thyromegaly present.  Cardiovascular: Normal rate and regular rhythm.  Exam reveals no gallop and no friction rub.   Murmur heard. Grade 3/6 cooing ejection murmur  Pulmonary/Chest: No respiratory distress. He has no wheezes. He has no rales.  Abdominal: Soft. Bowel sounds are normal. He exhibits  no distension and no mass. There is no tenderness.  Genitourinary:  Foley Cath  Musculoskeletal: Normal range of motion. He exhibits tenderness (both knees). He exhibits no edema.  Lymphadenopathy:    He has no cervical adenopathy.  Neurological: He is alert and oriented to person, place, and time. No cranial nerve deficit. Coordination normal.  04/14/13 MMSE: 29/30.  Failed clock drawing.  Skin: No rash (at neck) noted. No erythema. No pallor.  Actinic keratosis.  Skin cancers removed from left postauricular area and posterior neck.  Psychiatric: His behavior is normal. Thought content normal.  Some loss of memory. Poor historian. Mild depression.   Filed Vitals:   11/23/2014 1130  BP: 104/62  Pulse: 70  Temp: 98 F (36.7 C)  TempSrc: Tympanic  Resp: 18      Labs reviewed: Basic Metabolic Panel:  Recent Labs  10/28/14 0320  11/01/14 0453 11/02/14 0412 11/08/14 11/11/14 1623  NA  --   < > 134* 134* 134* 133*  K  --   < > 3.7 3.4* 4.3 5.2*  CL  --   < > 94* 89*  --  94*  CO2  --   < > 34* 36*  --  30  GLUCOSE  --   < > 99 91  --  112*  BUN  --   < > 19 18 22* 22*  CREATININE  --   < > 1.30 1.22 2.3* 1.77*  CALCIUM  --   < > 8.2* 8.6  --  9.4  TSH 1.283  --   --   --   --   --   < > = values in this interval not displayed. Liver Function Tests:  Recent Labs  10/27/14 1552 10/28/14 0350 11/08/14  AST 19 17 21   ALT 13 11 10   ALKPHOS 63 56 54  BILITOT 0.8 0.4  --   PROT 6.5 5.7*  --   ALBUMIN 3.8 3.3*  --     Recent Labs  10/27/14 1552  LIPASE 26   No results for input(s): AMMONIA in the last 8760 hours. CBC:  Recent Labs  10/27/14 1552  11/01/14 1730 11/02/14 0412 11/08/14 11/11/14 1623  WBC 9.7  < > 8.0 7.7 7.9 10.6*  NEUTROABS 7.3  --   --   --   --  8.9*  HGB 10.5*  < > 9.7* 9.6* 9.4* 9.4*  HCT 33.4*  < > 31.0* 30.9* 30* 31.6*  MCV 86.8  < > 88.1 89.6  --  88.8  PLT 421*  < > 367 384 384 377  < > = values in this interval not displayed. Lipid Panel:  Recent Labs  02/22/14 08/02/14  CHOL 192 179  HDL 63 65  LDLCALC 110 99  TRIG 95 75    Past Procedures:  10/27/14 CT abd with contrast:  IMPRESSION: Changes suggestive of bladder mass. Direct visualization is recommended.  Bilateral lower lobe infiltrates with associated effusions.  Bilateral renal cystic change.  Fat containing  periumbilical hernia.   Assessment/Plan UTI (urinary tract infection) 11/15/14 urinary culture enterococcus-23-Nov-2014 started 7 day course of Amoxicillin 500mg  bid-will start Nitrofurantoin 50mg  qhs after current Abx tx    Bladder neoplasm Takes Oxybutynin 10mg  for symptomatic management.    Acute diastolic heart failure Compensated clinically, continue Enalapril 5mg  daily, Furosemide 40mg  qd, Kcl 41meq daily.    Allergic rhinitis Stable, takes Flonase daily   Depression (emotion) Takes Cymbalta 30mg  daily, Na 134 11/08/14. Alprazolam 0.25mg  daily prn  available to help relax and sleep at night in addition to 0.25mg  tid and Ambien 10mg  qhs.     Constipation Managed with Colace 300mg  and MiraLax daily and Senna S III nightly.     History of GI bleed Stable, continue Omeprazole 20mg  dialy.    Prostate cancer s/p prostatectomy F/u urology    Enlarged prostate Foley catheter, takes 0.4mg  daily.     Hereditary and idiopathic peripheral neuropathy Pain is 4/10 today, takes Neurontin 900mg  and Oxycodone 20mg  bid for pain. Prn Oxycodone 10mg  q4h available to him-used daily almost.    Chronic renal disease 11/08/14 Bun/creat 22/2.30-diuretic contributory-observe.     Acute blood loss anemia 11/08/14 Hgb 9.4, continue to observe the patient.       Family/ Staff Communication: observe the patient.   Goals of Care: SNF  Labs/tests ordered: none

## 2014-12-08 NOTE — Assessment & Plan Note (Signed)
Stable, continue Omeprazole 20mg  dialy.

## 2014-12-08 NOTE — Assessment & Plan Note (Signed)
11/15/14 urinary culture enterococcus-2014-11-29 started 7 day course of Amoxicillin 500mg  bid-will start Nitrofurantoin 50mg  qhs after current Abx tx

## 2014-12-08 DEATH — deceased

## 2015-01-04 ENCOUNTER — Encounter: Payer: Self-pay | Admitting: Internal Medicine

## 2015-03-02 ENCOUNTER — Ambulatory Visit: Payer: Medicare Other | Admitting: Neurology
